# Patient Record
Sex: Female | Born: 1970 | Race: Black or African American | Hispanic: No | Smoking: Current every day smoker
Health system: Southern US, Community
[De-identification: ages and names within clinical notes are randomized; demographics above are authoritative.]

## PROBLEM LIST (undated history)

## (undated) DIAGNOSIS — I10 Essential (primary) hypertension: Secondary | ICD-10-CM

## (undated) DIAGNOSIS — F319 Bipolar disorder, unspecified: Secondary | ICD-10-CM

## (undated) DIAGNOSIS — E7800 Pure hypercholesterolemia, unspecified: Secondary | ICD-10-CM

## (undated) DIAGNOSIS — N200 Calculus of kidney: Secondary | ICD-10-CM

## (undated) DIAGNOSIS — F209 Schizophrenia, unspecified: Secondary | ICD-10-CM

## (undated) HISTORY — PX: TUBAL LIGATION: SHX77

## (undated) HISTORY — PX: CHOLECYSTECTOMY: SHX55

---

## 2000-06-20 ENCOUNTER — Ambulatory Visit (HOSPITAL_COMMUNITY): Admission: RE | Admit: 2000-06-20 | Discharge: 2000-06-20 | Payer: Self-pay | Admitting: Urology

## 2000-06-20 ENCOUNTER — Encounter: Payer: Self-pay | Admitting: Urology

## 2000-07-02 ENCOUNTER — Ambulatory Visit (HOSPITAL_COMMUNITY): Admission: RE | Admit: 2000-07-02 | Discharge: 2000-07-02 | Payer: Self-pay

## 2000-07-02 ENCOUNTER — Encounter: Payer: Self-pay | Admitting: Urology

## 2000-08-08 ENCOUNTER — Ambulatory Visit (HOSPITAL_COMMUNITY): Admission: RE | Admit: 2000-08-08 | Discharge: 2000-08-08 | Payer: Self-pay | Admitting: Urology

## 2000-08-08 ENCOUNTER — Encounter: Payer: Self-pay | Admitting: Urology

## 2000-08-23 ENCOUNTER — Ambulatory Visit (HOSPITAL_COMMUNITY): Admission: RE | Admit: 2000-08-23 | Discharge: 2000-08-23 | Payer: Self-pay

## 2000-08-23 ENCOUNTER — Encounter: Payer: Self-pay | Admitting: Urology

## 2000-11-21 ENCOUNTER — Emergency Department (HOSPITAL_COMMUNITY): Admission: EM | Admit: 2000-11-21 | Discharge: 2000-11-21 | Payer: Self-pay | Admitting: *Deleted

## 2000-12-12 ENCOUNTER — Emergency Department (HOSPITAL_COMMUNITY): Admission: EM | Admit: 2000-12-12 | Discharge: 2000-12-12 | Payer: Self-pay | Admitting: Emergency Medicine

## 2001-05-17 ENCOUNTER — Emergency Department (HOSPITAL_COMMUNITY): Admission: EM | Admit: 2001-05-17 | Discharge: 2001-05-17 | Payer: Self-pay

## 2001-06-24 ENCOUNTER — Emergency Department (HOSPITAL_COMMUNITY): Admission: EM | Admit: 2001-06-24 | Discharge: 2001-06-24 | Payer: Self-pay | Admitting: Emergency Medicine

## 2002-11-05 ENCOUNTER — Emergency Department (HOSPITAL_COMMUNITY): Admission: EM | Admit: 2002-11-05 | Discharge: 2002-11-05 | Payer: Self-pay | Admitting: Emergency Medicine

## 2003-05-21 ENCOUNTER — Emergency Department (HOSPITAL_COMMUNITY): Admission: EM | Admit: 2003-05-21 | Discharge: 2003-05-21 | Payer: Self-pay | Admitting: Internal Medicine

## 2003-06-10 ENCOUNTER — Ambulatory Visit (HOSPITAL_COMMUNITY): Admission: RE | Admit: 2003-06-10 | Discharge: 2003-06-10 | Payer: Self-pay | Admitting: Internal Medicine

## 2003-06-15 ENCOUNTER — Ambulatory Visit (HOSPITAL_COMMUNITY): Admission: RE | Admit: 2003-06-15 | Discharge: 2003-06-15 | Payer: Self-pay | Admitting: Internal Medicine

## 2003-06-26 ENCOUNTER — Ambulatory Visit (HOSPITAL_COMMUNITY): Admission: RE | Admit: 2003-06-26 | Discharge: 2003-06-26 | Payer: Self-pay | Admitting: Family Medicine

## 2003-07-13 ENCOUNTER — Ambulatory Visit (HOSPITAL_COMMUNITY): Admission: RE | Admit: 2003-07-13 | Discharge: 2003-07-13 | Payer: Self-pay | Admitting: Internal Medicine

## 2003-08-28 ENCOUNTER — Ambulatory Visit (HOSPITAL_COMMUNITY): Admission: RE | Admit: 2003-08-28 | Discharge: 2003-08-28 | Payer: Self-pay | Admitting: General Surgery

## 2004-02-09 ENCOUNTER — Ambulatory Visit: Payer: Self-pay | Admitting: Psychiatry

## 2004-02-09 ENCOUNTER — Emergency Department (HOSPITAL_COMMUNITY): Admission: EM | Admit: 2004-02-09 | Discharge: 2004-02-09 | Payer: Self-pay | Admitting: Emergency Medicine

## 2004-02-09 ENCOUNTER — Inpatient Hospital Stay (HOSPITAL_COMMUNITY): Admission: AD | Admit: 2004-02-09 | Discharge: 2004-02-18 | Payer: Self-pay | Admitting: Psychiatry

## 2004-02-27 ENCOUNTER — Emergency Department (HOSPITAL_COMMUNITY): Admission: EM | Admit: 2004-02-27 | Discharge: 2004-02-27 | Payer: Self-pay | Admitting: Emergency Medicine

## 2004-03-08 ENCOUNTER — Inpatient Hospital Stay (HOSPITAL_COMMUNITY): Admission: RE | Admit: 2004-03-08 | Discharge: 2004-03-23 | Payer: Self-pay | Admitting: Psychiatry

## 2004-04-13 ENCOUNTER — Ambulatory Visit (HOSPITAL_COMMUNITY): Admission: RE | Admit: 2004-04-13 | Discharge: 2004-04-13 | Payer: Self-pay | Admitting: Family Medicine

## 2004-10-03 ENCOUNTER — Ambulatory Visit (HOSPITAL_COMMUNITY): Admission: RE | Admit: 2004-10-03 | Discharge: 2004-10-03 | Payer: Self-pay | Admitting: Urology

## 2004-11-04 ENCOUNTER — Ambulatory Visit (HOSPITAL_COMMUNITY): Admission: RE | Admit: 2004-11-04 | Discharge: 2004-11-04 | Payer: Self-pay | Admitting: Urology

## 2005-04-21 ENCOUNTER — Ambulatory Visit (HOSPITAL_COMMUNITY): Admission: RE | Admit: 2005-04-21 | Discharge: 2005-04-21 | Payer: Self-pay | Admitting: Family Medicine

## 2005-10-12 ENCOUNTER — Emergency Department (HOSPITAL_COMMUNITY): Admission: EM | Admit: 2005-10-12 | Discharge: 2005-10-12 | Payer: Self-pay | Admitting: Emergency Medicine

## 2006-04-08 ENCOUNTER — Ambulatory Visit: Payer: Self-pay | Admitting: *Deleted

## 2006-04-09 ENCOUNTER — Inpatient Hospital Stay (HOSPITAL_COMMUNITY): Admission: EM | Admit: 2006-04-09 | Discharge: 2006-04-10 | Payer: Self-pay | Admitting: *Deleted

## 2006-06-26 ENCOUNTER — Emergency Department (HOSPITAL_COMMUNITY): Admission: EM | Admit: 2006-06-26 | Discharge: 2006-06-26 | Payer: Self-pay | Admitting: Emergency Medicine

## 2006-08-02 ENCOUNTER — Emergency Department (HOSPITAL_COMMUNITY): Admission: EM | Admit: 2006-08-02 | Discharge: 2006-08-02 | Payer: Self-pay | Admitting: Emergency Medicine

## 2007-09-17 ENCOUNTER — Emergency Department (HOSPITAL_COMMUNITY): Admission: EM | Admit: 2007-09-17 | Discharge: 2007-09-17 | Payer: Self-pay | Admitting: Emergency Medicine

## 2007-12-24 ENCOUNTER — Other Ambulatory Visit: Admission: RE | Admit: 2007-12-24 | Discharge: 2007-12-24 | Payer: Self-pay | Admitting: Obstetrics & Gynecology

## 2008-01-06 ENCOUNTER — Emergency Department (HOSPITAL_COMMUNITY): Admission: EM | Admit: 2008-01-06 | Discharge: 2008-01-06 | Payer: Self-pay | Admitting: Emergency Medicine

## 2008-06-17 ENCOUNTER — Emergency Department (HOSPITAL_COMMUNITY): Admission: EM | Admit: 2008-06-17 | Discharge: 2008-06-17 | Payer: Self-pay | Admitting: Emergency Medicine

## 2008-08-03 ENCOUNTER — Emergency Department (HOSPITAL_COMMUNITY): Admission: EM | Admit: 2008-08-03 | Discharge: 2008-08-04 | Payer: Self-pay | Admitting: Emergency Medicine

## 2009-02-21 ENCOUNTER — Emergency Department (HOSPITAL_COMMUNITY): Admission: EM | Admit: 2009-02-21 | Discharge: 2009-02-21 | Payer: Self-pay | Admitting: Emergency Medicine

## 2009-04-25 ENCOUNTER — Emergency Department (HOSPITAL_COMMUNITY): Admission: EM | Admit: 2009-04-25 | Discharge: 2009-04-25 | Payer: Self-pay | Admitting: Emergency Medicine

## 2009-09-02 ENCOUNTER — Emergency Department (HOSPITAL_COMMUNITY): Admission: EM | Admit: 2009-09-02 | Discharge: 2009-09-02 | Payer: Self-pay | Admitting: Emergency Medicine

## 2009-10-03 ENCOUNTER — Emergency Department (HOSPITAL_COMMUNITY): Admission: EM | Admit: 2009-10-03 | Discharge: 2009-10-03 | Payer: Self-pay | Admitting: Emergency Medicine

## 2009-10-04 ENCOUNTER — Emergency Department (HOSPITAL_COMMUNITY): Admission: EM | Admit: 2009-10-04 | Discharge: 2009-10-11 | Payer: Self-pay | Admitting: Emergency Medicine

## 2009-12-12 ENCOUNTER — Emergency Department (HOSPITAL_COMMUNITY): Admission: EM | Admit: 2009-12-12 | Discharge: 2009-12-12 | Payer: Self-pay | Admitting: Emergency Medicine

## 2009-12-13 ENCOUNTER — Emergency Department (HOSPITAL_COMMUNITY): Admission: EM | Admit: 2009-12-13 | Discharge: 2009-12-13 | Payer: Self-pay | Admitting: Emergency Medicine

## 2009-12-14 ENCOUNTER — Emergency Department (HOSPITAL_COMMUNITY): Admission: EM | Admit: 2009-12-14 | Discharge: 2009-12-20 | Payer: Self-pay | Admitting: Emergency Medicine

## 2010-02-16 ENCOUNTER — Emergency Department (HOSPITAL_COMMUNITY)
Admission: EM | Admit: 2010-02-16 | Discharge: 2010-02-18 | Disposition: A | Discharge status: Other Acute Inpatient Hospital | Payer: Self-pay | Source: Home / Self Care | Admitting: Emergency Medicine

## 2010-04-07 ENCOUNTER — Emergency Department (HOSPITAL_COMMUNITY)
Admission: EM | Admit: 2010-04-07 | Discharge: 2010-04-08 | Disposition: A | Discharge status: Home / Self Care | Payer: Medicaid Other | Attending: Emergency Medicine | Admitting: Emergency Medicine

## 2010-04-07 DIAGNOSIS — M545 Low back pain, unspecified: Secondary | ICD-10-CM | POA: Insufficient documentation

## 2010-04-07 DIAGNOSIS — R350 Frequency of micturition: Secondary | ICD-10-CM | POA: Insufficient documentation

## 2010-04-07 DIAGNOSIS — B9789 Other viral agents as the cause of diseases classified elsewhere: Secondary | ICD-10-CM | POA: Insufficient documentation

## 2010-04-07 DIAGNOSIS — R509 Fever, unspecified: Secondary | ICD-10-CM | POA: Insufficient documentation

## 2010-04-07 DIAGNOSIS — I10 Essential (primary) hypertension: Secondary | ICD-10-CM | POA: Insufficient documentation

## 2010-04-07 DIAGNOSIS — E119 Type 2 diabetes mellitus without complications: Secondary | ICD-10-CM | POA: Insufficient documentation

## 2010-04-07 DIAGNOSIS — F17200 Nicotine dependence, unspecified, uncomplicated: Secondary | ICD-10-CM | POA: Insufficient documentation

## 2010-04-07 DIAGNOSIS — F319 Bipolar disorder, unspecified: Secondary | ICD-10-CM | POA: Insufficient documentation

## 2010-04-07 DIAGNOSIS — R10819 Abdominal tenderness, unspecified site: Secondary | ICD-10-CM | POA: Insufficient documentation

## 2010-04-07 DIAGNOSIS — R110 Nausea: Secondary | ICD-10-CM | POA: Insufficient documentation

## 2010-04-07 LAB — URINALYSIS, ROUTINE W REFLEX MICROSCOPIC
Specific Gravity, Urine: <1.005 — ABNORMAL LOW (ref 1.005–1.030)
Urine Glucose, Fasting: >1000 mg/dL — AB
Urobilinogen, UA: 0.2 mg/dL (ref 0.0–1.0)
pH: 6.5 (ref 5.0–8.0)

## 2010-04-07 LAB — URINE MICROSCOPIC-ADD ON

## 2010-04-08 ENCOUNTER — Emergency Department (HOSPITAL_COMMUNITY): Admit: 2010-04-08 | Discharge: 2010-04-08 | Disposition: A | Discharge status: Home / Self Care | Payer: Medicaid Other

## 2010-04-08 LAB — GLUCOSE, CAPILLARY
Glucose-Capillary: 288 mg/dL — ABNORMAL HIGH (ref 70–99)
Glucose-Capillary: 314 mg/dL — ABNORMAL HIGH (ref 70–99)

## 2010-04-08 LAB — BASIC METABOLIC PANEL
BUN: 4 mg/dL — ABNORMAL LOW (ref 6–23)
Calcium: 9.6 mg/dL (ref 8.4–10.5)
GFR calc non Af Amer: >60 mL/min (ref >60)
Glucose, Bld: 323 mg/dL — ABNORMAL HIGH (ref 70–99)

## 2010-04-08 LAB — CBC
HCT: 31.6 % — ABNORMAL LOW (ref 36.0–46.0)
MCHC: 35.1 g/dL (ref 30.0–36.0)
MCV: 86.1 fL (ref 78.0–100.0)
RDW: 13.8 % (ref 11.5–15.5)

## 2010-04-08 LAB — DIFFERENTIAL
Basophils Absolute: 0.1 10*3/uL (ref 0.0–0.1)
Eosinophils Absolute: 0.2 10*3/uL (ref 0.0–0.7)
Eosinophils Relative: 2 % (ref 0–5)
Lymphocytes Relative: 12 % (ref 12–46)
Lymphs Abs: 2.1 10*3/uL (ref 0.7–4.0)
Monocytes Absolute: 1.8 10*3/uL — ABNORMAL HIGH (ref 0.1–1.0)

## 2010-04-10 ENCOUNTER — Emergency Department (HOSPITAL_COMMUNITY): Payer: Medicaid Other

## 2010-04-10 ENCOUNTER — Inpatient Hospital Stay (HOSPITAL_COMMUNITY)
Admission: EM | Admit: 2010-04-10 | Discharge: 2010-04-11 | DRG: 690 | Discharge status: Against Medical Advice | Payer: Medicaid Other | Attending: Family Medicine | Admitting: Family Medicine

## 2010-04-10 DIAGNOSIS — F319 Bipolar disorder, unspecified: Secondary | ICD-10-CM | POA: Diagnosis present

## 2010-04-10 DIAGNOSIS — Z9119 Patient's noncompliance with other medical treatment and regimen: Secondary | ICD-10-CM

## 2010-04-10 DIAGNOSIS — Z91199 Patient's noncompliance with other medical treatment and regimen due to unspecified reason: Secondary | ICD-10-CM

## 2010-04-10 DIAGNOSIS — E785 Hyperlipidemia, unspecified: Secondary | ICD-10-CM | POA: Diagnosis present

## 2010-04-10 DIAGNOSIS — E119 Type 2 diabetes mellitus without complications: Secondary | ICD-10-CM | POA: Diagnosis present

## 2010-04-10 DIAGNOSIS — N390 Urinary tract infection, site not specified: Principal | ICD-10-CM | POA: Diagnosis present

## 2010-04-10 DIAGNOSIS — F209 Schizophrenia, unspecified: Secondary | ICD-10-CM | POA: Diagnosis present

## 2010-04-10 LAB — GLUCOSE, CAPILLARY: Glucose-Capillary: 330 mg/dL — ABNORMAL HIGH (ref 70–99)

## 2010-04-10 LAB — CBC
HCT: 30.9 % — ABNORMAL LOW (ref 36.0–46.0)
MCHC: 34.6 g/dL (ref 30.0–36.0)
RDW: 13.4 % (ref 11.5–15.5)
WBC: 15.1 10*3/uL — ABNORMAL HIGH (ref 4.0–10.5)

## 2010-04-10 LAB — URINE MICROSCOPIC-ADD ON

## 2010-04-10 LAB — URINALYSIS, ROUTINE W REFLEX MICROSCOPIC
Bilirubin Urine: NEGATIVE
Ketones, ur: NEGATIVE mg/dL
Leukocytes, UA: NEGATIVE
Nitrite: NEGATIVE
Protein, ur: 30 mg/dL — AB

## 2010-04-10 LAB — DIFFERENTIAL
Basophils Absolute: 0.1 10*3/uL (ref 0.0–0.1)
Basophils Relative: 0 % (ref 0–1)
Eosinophils Relative: 3 % (ref 0–5)
Lymphocytes Relative: 17 % (ref 12–46)
Monocytes Absolute: 1.5 10*3/uL — ABNORMAL HIGH (ref 0.1–1.0)
Neutro Abs: 10.6 10*3/uL — ABNORMAL HIGH (ref 1.7–7.7)

## 2010-04-10 LAB — COMPREHENSIVE METABOLIC PANEL
ALT: 20 U/L (ref 0–35)
Alkaline Phosphatase: 80 U/L (ref 39–117)
BUN: 2 mg/dL — ABNORMAL LOW (ref 6–23)
CO2: 23 mEq/L (ref 19–32)
Chloride: 98 mEq/L (ref 96–112)
Glucose, Bld: 340 mg/dL — ABNORMAL HIGH (ref 70–99)
Potassium: 3.3 mEq/L — ABNORMAL LOW (ref 3.5–5.1)
Total Bilirubin: 0.3 mg/dL (ref 0.3–1.2)

## 2010-04-10 LAB — LIPASE, BLOOD: Lipase: 21 U/L (ref 11–59)

## 2010-04-11 LAB — GLUCOSE, CAPILLARY
Glucose-Capillary: 352 mg/dL — ABNORMAL HIGH (ref 70–99)
Glucose-Capillary: 382 mg/dL — ABNORMAL HIGH (ref 70–99)

## 2010-04-13 LAB — URINE CULTURE: Culture  Setup Time: 201202051253

## 2010-04-16 LAB — CULTURE, BLOOD (ROUTINE X 2): Culture: NO GROWTH

## 2010-04-21 NOTE — H&P (Signed)
NAME:  Emily Cordova, Emily Cordova NO.:  [id number]  MEDICAL RECORD NO.:  [id number]           PATIENT TYPE:  LOCATION:                                 FACILITY:  PHYSICIAN:  Melvyn Novas, MDDATE OF BIRTH:  1970-05-03  DATE OF ADMISSION: DATE OF DISCHARGE:  LH                             HISTORY & PHYSICAL   HISTORY OF PRESENT ILLNESS:  The patient is a 40 year old black female with a known history of bipolar disorder and schizophrenia who was followed by me for type 2 diabetes, hyperlipidemia, and some mild hypertension who apparently came in last night complaining of back pain, some dysuria, frequency, and so forth.  She also has multiple complaints of right ear fullness, cephalgia, some mild cough, anxiety, and paresthesia of the right arm.  She was found to have a UTI of significance.  She was admitted, placed on IV Rocephin, and there is a question of compliance here.  She denies gross hematuria, nausea, vomiting, hematemesis, and melena.  PAST MEDICAL HISTORY:  Significant for schizophrenia, bipolar disorder, hypertension, diabetes, and cephalgia.  PAST SURGICAL HISTORY:  Remarkable for cholecystectomy.  ALLERGIES:  She has allergy listed as HALDOL in the old medical record.  MEDICATIONS:  Current medicines by me in the office are Zocor 20 mg per day, Cogentin 2 mg p.o. b.i.d., metformin 1000 mg p.o. b.i.d., aspirin 81 mg per day, Norvasc 5 mg per day, lisinopril 20/12.5 p.o. daily, lithium 300 mg 2 tablets in a.m. and 3 tablets at nightly, Zyprexa 10 mg nightly, as well as Invega injections once a month unknown dosage.  SOCIAL HISTORY:  She lives with daughter, get psychotropic medicines from [facility] regularly once a week.  She smokes 5 cigarettes per day,does not imbibe alcohol.  VITAL SIGNS:  Blood pressure is 117/73, temperature 97.22, pulse 92 and regular, respiratory rate is 28, O2 status is 88%. EYES:  PERRLA intact.  Sclerae clear.   Conjunctivae pink. NECK:  JVD.  No carotid bruits.  No thyromegaly or thyroid bruits. LUNGS:  Prolonged expiratory phase.  Scattered rhonchi.  No rales.  No wheeze. HEART:  Regular rhythm.  No murmurs, gallops, heaves, or rubs. ABDOMEN:  Soft and nontender.  Bowel sounds are normoactive.  No guarding.  No rebound, mass or megaly.  No CVA tenderness. EXTREMITIES:  Trace to 1+ pedal edema. NEUROLOGIC: The patient appears highly anxious some tangential thought and mild flight of ideas.  Cranial nerves II through XII are grossly intact.  Moves all 4 extremities.  Plantars are downgoing.  IMPRESSION:  As follows. 1. Urinary tract infection. 2. Chronic noncompliance. 3. Hypertension. 4. Diabetes. 5. Schizophrenia. 6. Bipolar disorder.  PLAN:  At the present is to continue most of her psychotropic medicines. We will hold antihypertensives as she is 89 right now and has been noncompliant with routine lithium level, hemoglobin A1c to assess compliance of thyroid status.  We will follow her clinically and hopefully we will have her out of the hospital 2-3 days.     Melvyn Novas, MD     RMD/MEDQ  D:  04/11/2010  T:  04/12/2010  Job:  [id number]  Electronically Signed by Oval Linsey MD on 04/21/2010 03:36:43 PM

## 2010-05-11 ENCOUNTER — Emergency Department (HOSPITAL_COMMUNITY)
Admission: EM | Admit: 2010-05-11 | Discharge: 2010-05-11 | Disposition: A | Discharge status: Home / Self Care | Payer: Medicaid Other | Attending: Emergency Medicine | Admitting: Emergency Medicine

## 2010-05-11 DIAGNOSIS — E876 Hypokalemia: Secondary | ICD-10-CM | POA: Insufficient documentation

## 2010-05-11 DIAGNOSIS — F319 Bipolar disorder, unspecified: Secondary | ICD-10-CM | POA: Insufficient documentation

## 2010-05-11 DIAGNOSIS — I10 Essential (primary) hypertension: Secondary | ICD-10-CM | POA: Insufficient documentation

## 2010-05-11 DIAGNOSIS — G478 Other sleep disorders: Secondary | ICD-10-CM | POA: Insufficient documentation

## 2010-05-11 DIAGNOSIS — E119 Type 2 diabetes mellitus without complications: Secondary | ICD-10-CM | POA: Insufficient documentation

## 2010-05-11 LAB — COMPREHENSIVE METABOLIC PANEL
ALT: 17 U/L (ref 0–35)
AST: 19 U/L (ref 0–37)
Albumin: 3.8 g/dL (ref 3.5–5.2)
Calcium: 9.5 mg/dL (ref 8.4–10.5)
Chloride: 104 mEq/L (ref 96–112)
Creatinine, Ser: 0.74 mg/dL (ref 0.4–1.2)
GFR calc Af Amer: >60 mL/min (ref >60)
Sodium: 135 mEq/L (ref 135–145)
Total Bilirubin: 0.5 mg/dL (ref 0.3–1.2)

## 2010-05-11 LAB — URINALYSIS, ROUTINE W REFLEX MICROSCOPIC
Bilirubin Urine: NEGATIVE
Glucose, UA: NEGATIVE mg/dL
Hgb urine dipstick: NEGATIVE
Specific Gravity, Urine: <1.005 — ABNORMAL LOW (ref 1.005–1.030)

## 2010-05-11 LAB — DIFFERENTIAL
Basophils Absolute: 0.1 10*3/uL (ref 0.0–0.1)
Basophils Relative: 0 % (ref 0–1)
Eosinophils Absolute: 0.5 10*3/uL (ref 0.0–0.7)
Monocytes Relative: 7 % (ref 3–12)
Neutro Abs: 6.9 10*3/uL (ref 1.7–7.7)
Neutrophils Relative %: 56 % (ref 43–77)

## 2010-05-11 LAB — RAPID URINE DRUG SCREEN, HOSP PERFORMED
Amphetamines: NOT DETECTED
Barbiturates: NOT DETECTED
Benzodiazepines: NOT DETECTED
Opiates: NOT DETECTED

## 2010-05-11 LAB — CBC
Hemoglobin: 12.7 g/dL (ref 12.0–15.0)
MCH: 29.7 pg (ref 26.0–34.0)
Platelets: 353 10*3/uL (ref 150–400)
RBC: 4.27 MIL/uL (ref 3.87–5.11)

## 2010-05-11 LAB — POCT CARDIAC MARKERS
CKMB, poc: <1 ng/mL — ABNORMAL LOW (ref 1.0–8.0)
Myoglobin, poc: 59.1 ng/mL (ref 12–200)
Troponin i, poc: <0.05 ng/mL (ref 0.00–0.09)

## 2010-05-12 ENCOUNTER — Emergency Department (HOSPITAL_COMMUNITY)
Admission: EM | Admit: 2010-05-12 | Discharge: 2010-05-16 | Disposition: A | Discharge status: Other Acute Inpatient Hospital | Payer: Medicaid Other | Attending: Emergency Medicine | Admitting: Emergency Medicine

## 2010-05-12 DIAGNOSIS — E119 Type 2 diabetes mellitus without complications: Secondary | ICD-10-CM | POA: Insufficient documentation

## 2010-05-12 DIAGNOSIS — F29 Unspecified psychosis not due to a substance or known physiological condition: Secondary | ICD-10-CM | POA: Insufficient documentation

## 2010-05-12 DIAGNOSIS — J189 Pneumonia, unspecified organism: Secondary | ICD-10-CM | POA: Insufficient documentation

## 2010-05-12 LAB — COMPREHENSIVE METABOLIC PANEL
ALT: 21 U/L (ref 0–35)
AST: 23 U/L (ref 0–37)
Alkaline Phosphatase: 100 U/L (ref 39–117)
CO2: 22 mEq/L (ref 19–32)
Chloride: 102 mEq/L (ref 96–112)
GFR calc Af Amer: >60 mL/min (ref >60)
GFR calc non Af Amer: >60 mL/min (ref >60)
Potassium: 3.5 mEq/L (ref 3.5–5.1)
Sodium: 134 mEq/L — ABNORMAL LOW (ref 135–145)
Total Bilirubin: 0.4 mg/dL (ref 0.3–1.2)

## 2010-05-12 LAB — RAPID URINE DRUG SCREEN, HOSP PERFORMED
Amphetamines: NOT DETECTED
Benzodiazepines: NOT DETECTED

## 2010-05-12 LAB — CBC
HCT: 39.4 % (ref 36.0–46.0)
Hemoglobin: 13 g/dL (ref 12.0–15.0)
RBC: 4.5 MIL/uL (ref 3.87–5.11)

## 2010-05-12 LAB — DIFFERENTIAL
Basophils Absolute: 0 10*3/uL (ref 0.0–0.1)
Basophils Relative: 0 % (ref 0–1)
Lymphocytes Relative: 24 % (ref 12–46)
Neutro Abs: 7.1 10*3/uL (ref 1.7–7.7)
Neutrophils Relative %: 68 % (ref 43–77)

## 2010-05-13 LAB — LITHIUM LEVEL: Lithium Lvl: 0.46 mEq/L — ABNORMAL LOW (ref 0.80–1.40)

## 2010-05-14 LAB — GLUCOSE, CAPILLARY
Glucose-Capillary: 240 mg/dL — ABNORMAL HIGH (ref 70–99)
Glucose-Capillary: 315 mg/dL — ABNORMAL HIGH (ref 70–99)

## 2010-05-15 ENCOUNTER — Emergency Department (HOSPITAL_COMMUNITY): Payer: Medicaid Other

## 2010-05-15 LAB — GLUCOSE, CAPILLARY
Glucose-Capillary: 285 mg/dL — ABNORMAL HIGH (ref 70–99)
Glucose-Capillary: 278 mg/dL — ABNORMAL HIGH (ref 70–99)
Glucose-Capillary: 252 mg/dL — ABNORMAL HIGH (ref 70–99)

## 2010-05-16 LAB — URINALYSIS, ROUTINE W REFLEX MICROSCOPIC
Leukocytes, UA: NEGATIVE
Nitrite: NEGATIVE
Specific Gravity, Urine: 1.02 (ref 1.005–1.030)
pH: 6 (ref 5.0–8.0)

## 2010-05-16 LAB — BASIC METABOLIC PANEL
Chloride: 105 mEq/L (ref 96–112)
GFR calc Af Amer: >60 mL/min (ref >60)
Potassium: 3.8 mEq/L (ref 3.5–5.1)

## 2010-05-16 LAB — LITHIUM LEVEL: Lithium Lvl: <0.25 mEq/L — ABNORMAL LOW (ref 0.80–1.40)

## 2010-05-16 LAB — CBC
HCT: 35.7 % — ABNORMAL LOW (ref 36.0–46.0)
MCV: 87.7 fL (ref 78.0–100.0)
RBC: 4.07 MIL/uL (ref 3.87–5.11)
RDW: 13.2 % (ref 11.5–15.5)
WBC: 11.2 10*3/uL — ABNORMAL HIGH (ref 4.0–10.5)

## 2010-05-16 LAB — DIFFERENTIAL
Eosinophils Relative: 2 % (ref 0–5)
Lymphocytes Relative: 26 % (ref 12–46)
Lymphs Abs: 2.9 10*3/uL (ref 0.7–4.0)
Monocytes Absolute: 0.6 10*3/uL (ref 0.1–1.0)

## 2010-05-16 LAB — GLUCOSE, CAPILLARY: Glucose-Capillary: 248 mg/dL — ABNORMAL HIGH (ref 70–99)

## 2010-05-16 LAB — URINE MICROSCOPIC-ADD ON

## 2010-05-16 LAB — PREGNANCY, URINE: Preg Test, Ur: NEGATIVE

## 2010-05-16 LAB — RAPID URINE DRUG SCREEN, HOSP PERFORMED: Benzodiazepines: NOT DETECTED

## 2010-05-18 LAB — URINE MICROSCOPIC-ADD ON

## 2010-05-18 LAB — CBC
HCT: 34.3 % — ABNORMAL LOW (ref 36.0–46.0)
HCT: 35.4 % — ABNORMAL LOW (ref 36.0–46.0)
HCT: 37.7 % (ref 36.0–46.0)
Hemoglobin: 12.2 g/dL (ref 12.0–15.0)
Hemoglobin: 12.9 g/dL (ref 12.0–15.0)
MCH: 31.4 pg (ref 26.0–34.0)
MCH: 31.6 pg (ref 26.0–34.0)
MCH: 31.7 pg (ref 26.0–34.0)
MCHC: 34.4 g/dL (ref 30.0–36.0)
MCHC: 34.6 g/dL (ref 30.0–36.0)
MCV: 91.7 fL (ref 78.0–100.0)
MCV: 92 fL (ref 78.0–100.0)
MCV: 92.2 fL (ref 78.0–100.0)
Platelets: 469 10*3/uL — ABNORMAL HIGH (ref 150–400)
RBC: 4.09 MIL/uL (ref 3.87–5.11)
RDW: 13.1 % (ref 11.5–15.5)
RDW: 13.5 % (ref 11.5–15.5)

## 2010-05-18 LAB — DIFFERENTIAL
Basophils Relative: 0 % (ref 0–1)
Basophils Relative: 0 % (ref 0–1)
Eosinophils Absolute: 0.2 10*3/uL (ref 0.0–0.7)
Eosinophils Absolute: 0.4 10*3/uL (ref 0.0–0.7)
Eosinophils Relative: 1 % (ref 0–5)
Lymphs Abs: 2.1 10*3/uL (ref 0.7–4.0)
Lymphs Abs: 2.4 10*3/uL (ref 0.7–4.0)
Lymphs Abs: 2.6 10*3/uL (ref 0.7–4.0)
Monocytes Absolute: 0.7 10*3/uL (ref 0.1–1.0)
Monocytes Relative: 4 % (ref 3–12)
Monocytes Relative: 6 % (ref 3–12)
Monocytes Relative: 6 % (ref 3–12)
Neutro Abs: 10.4 10*3/uL — ABNORMAL HIGH (ref 1.7–7.7)
Neutro Abs: 10.5 10*3/uL — ABNORMAL HIGH (ref 1.7–7.7)
Neutrophils Relative %: 73 % (ref 43–77)
Neutrophils Relative %: 73 % (ref 43–77)

## 2010-05-18 LAB — LITHIUM LEVEL
Lithium Lvl: 0.35 mEq/L — ABNORMAL LOW (ref 0.80–1.40)
Lithium Lvl: 0.37 mEq/L — ABNORMAL LOW (ref 0.80–1.40)
Lithium Lvl: 0.48 mEq/L — ABNORMAL LOW (ref 0.80–1.40)

## 2010-05-18 LAB — COMPREHENSIVE METABOLIC PANEL
Alkaline Phosphatase: 94 U/L (ref 39–117)
BUN: 4 mg/dL — ABNORMAL LOW (ref 6–23)
BUN: 6 mg/dL (ref 6–23)
CO2: 22 mEq/L (ref 19–32)
Calcium: 9.5 mg/dL (ref 8.4–10.5)
Calcium: 9.6 mg/dL (ref 8.4–10.5)
GFR calc non Af Amer: >60 mL/min (ref >60)
Glucose, Bld: 280 mg/dL — ABNORMAL HIGH (ref 70–99)
Glucose, Bld: 387 mg/dL — ABNORMAL HIGH (ref 70–99)
Total Protein: 7.3 g/dL (ref 6.0–8.3)
Total Protein: 7.8 g/dL (ref 6.0–8.3)

## 2010-05-18 LAB — URINALYSIS, ROUTINE W REFLEX MICROSCOPIC
Bilirubin Urine: NEGATIVE
Glucose, UA: >1000 mg/dL — AB
Ketones, ur: NEGATIVE mg/dL
Nitrite: NEGATIVE
Specific Gravity, Urine: 1.01 (ref 1.005–1.030)
Urobilinogen, UA: 0.2 mg/dL (ref 0.0–1.0)
pH: 5.5 (ref 5.0–8.0)

## 2010-05-18 LAB — RAPID URINE DRUG SCREEN, HOSP PERFORMED
Barbiturates: NOT DETECTED
Barbiturates: NOT DETECTED
Benzodiazepines: NOT DETECTED
Cocaine: NOT DETECTED
Opiates: NOT DETECTED
Opiates: NOT DETECTED
Tetrahydrocannabinol: NOT DETECTED

## 2010-05-18 LAB — GLUCOSE, CAPILLARY
Glucose-Capillary: 148 mg/dL — ABNORMAL HIGH (ref 70–99)
Glucose-Capillary: 173 mg/dL — ABNORMAL HIGH (ref 70–99)
Glucose-Capillary: 179 mg/dL — ABNORMAL HIGH (ref 70–99)
Glucose-Capillary: 109 mg/dL — ABNORMAL HIGH (ref 70–99)
Glucose-Capillary: 161 mg/dL — ABNORMAL HIGH (ref 70–99)
Glucose-Capillary: 177 mg/dL — ABNORMAL HIGH (ref 70–99)
Glucose-Capillary: 204 mg/dL — ABNORMAL HIGH (ref 70–99)
Glucose-Capillary: 217 mg/dL — ABNORMAL HIGH (ref 70–99)
Glucose-Capillary: 218 mg/dL — ABNORMAL HIGH (ref 70–99)
Glucose-Capillary: 284 mg/dL — ABNORMAL HIGH (ref 70–99)

## 2010-05-18 LAB — BASIC METABOLIC PANEL
CO2: 21 mEq/L (ref 19–32)
Chloride: 105 mEq/L (ref 96–112)
Creatinine, Ser: 0.64 mg/dL (ref 0.4–1.2)
GFR calc Af Amer: >60 mL/min (ref >60)
Potassium: 3.9 mEq/L (ref 3.5–5.1)

## 2010-05-18 LAB — POCT PREGNANCY, URINE: Preg Test, Ur: NEGATIVE

## 2010-05-19 LAB — GLUCOSE, CAPILLARY: Glucose-Capillary: 241 mg/dL — ABNORMAL HIGH (ref 70–99)

## 2010-05-20 LAB — GLUCOSE, CAPILLARY
Glucose-Capillary: 109 mg/dL — ABNORMAL HIGH (ref 70–99)
Glucose-Capillary: 111 mg/dL — ABNORMAL HIGH (ref 70–99)
Glucose-Capillary: 123 mg/dL — ABNORMAL HIGH (ref 70–99)
Glucose-Capillary: 125 mg/dL — ABNORMAL HIGH (ref 70–99)
Glucose-Capillary: 129 mg/dL — ABNORMAL HIGH (ref 70–99)
Glucose-Capillary: 132 mg/dL — ABNORMAL HIGH (ref 70–99)
Glucose-Capillary: 139 mg/dL — ABNORMAL HIGH (ref 70–99)
Glucose-Capillary: 167 mg/dL — ABNORMAL HIGH (ref 70–99)
Glucose-Capillary: 168 mg/dL — ABNORMAL HIGH (ref 70–99)
Glucose-Capillary: 179 mg/dL — ABNORMAL HIGH (ref 70–99)
Glucose-Capillary: 191 mg/dL — ABNORMAL HIGH (ref 70–99)
Glucose-Capillary: 224 mg/dL — ABNORMAL HIGH (ref 70–99)
Glucose-Capillary: 230 mg/dL — ABNORMAL HIGH (ref 70–99)
Glucose-Capillary: 331 mg/dL — ABNORMAL HIGH (ref 70–99)
Glucose-Capillary: 355 mg/dL — ABNORMAL HIGH (ref 70–99)

## 2010-05-20 LAB — DIFFERENTIAL
Basophils Absolute: 0.1 10*3/uL (ref 0.0–0.1)
Basophils Relative: 0 % (ref 0–1)
Basophils Relative: 0 % (ref 0–1)
Eosinophils Absolute: 0.3 10*3/uL (ref 0.0–0.7)
Eosinophils Relative: 1 % (ref 0–5)
Eosinophils Relative: 2 % (ref 0–5)
Lymphocytes Relative: 13 % (ref 12–46)
Lymphs Abs: 2.5 10*3/uL (ref 0.7–4.0)
Monocytes Absolute: 0.9 10*3/uL (ref 0.1–1.0)
Monocytes Absolute: 1 10*3/uL (ref 0.1–1.0)
Monocytes Relative: 5 % (ref 3–12)
Monocytes Relative: 8 % (ref 3–12)
Neutro Abs: 15.4 10*3/uL — ABNORMAL HIGH (ref 1.7–7.7)

## 2010-05-20 LAB — CBC
HCT: 39.5 % (ref 36.0–46.0)
HCT: 41.3 % (ref 36.0–46.0)
Hemoglobin: 13.6 g/dL (ref 12.0–15.0)
Hemoglobin: 14.3 g/dL (ref 12.0–15.0)
MCH: 31 pg (ref 26.0–34.0)
MCHC: 34.4 g/dL (ref 30.0–36.0)
MCHC: 34.5 g/dL (ref 30.0–36.0)
MCV: 90.1 fL (ref 78.0–100.0)
MCV: 90.6 fL (ref 78.0–100.0)
RBC: 4.38 MIL/uL (ref 3.87–5.11)
RDW: 12.5 % (ref 11.5–15.5)

## 2010-05-20 LAB — BASIC METABOLIC PANEL
BUN: 28 mg/dL — ABNORMAL HIGH (ref 6–23)
BUN: 7 mg/dL (ref 6–23)
CO2: 24 mEq/L (ref 19–32)
Calcium: 10.3 mg/dL (ref 8.4–10.5)
Chloride: 100 mEq/L (ref 96–112)
Chloride: 103 mEq/L (ref 96–112)
Creatinine, Ser: 0.96 mg/dL (ref 0.4–1.2)
GFR calc Af Amer: >60 mL/min (ref >60)
GFR calc non Af Amer: >60 mL/min (ref >60)
GFR calc non Af Amer: >60 mL/min (ref >60)
Glucose, Bld: 230 mg/dL — ABNORMAL HIGH (ref 70–99)
Glucose, Bld: 353 mg/dL — ABNORMAL HIGH (ref 70–99)
Potassium: 3.7 mEq/L (ref 3.5–5.1)
Sodium: 129 mEq/L — ABNORMAL LOW (ref 135–145)
Sodium: 134 mEq/L — ABNORMAL LOW (ref 135–145)

## 2010-05-20 LAB — URINE MICROSCOPIC-ADD ON

## 2010-05-20 LAB — LITHIUM LEVEL
Lithium Lvl: 0.66 mEq/L — ABNORMAL LOW (ref 0.80–1.40)
Lithium Lvl: 1.97 mEq/L — ABNORMAL HIGH (ref 0.80–1.40)

## 2010-05-20 LAB — RAPID URINE DRUG SCREEN, HOSP PERFORMED
Barbiturates: NOT DETECTED
Cocaine: NOT DETECTED
Opiates: NOT DETECTED

## 2010-05-20 LAB — URINALYSIS, ROUTINE W REFLEX MICROSCOPIC
Glucose, UA: >1000 mg/dL — AB
Hgb urine dipstick: NEGATIVE
Leukocytes, UA: NEGATIVE
Specific Gravity, Urine: <1.005 — ABNORMAL LOW (ref 1.005–1.030)
pH: 7 (ref 5.0–8.0)

## 2010-05-21 LAB — COMPREHENSIVE METABOLIC PANEL
AST: 22 U/L (ref 0–37)
Albumin: 4.8 g/dL (ref 3.5–5.2)
Chloride: 100 mEq/L (ref 96–112)
Creatinine, Ser: 0.69 mg/dL (ref 0.4–1.2)
GFR calc Af Amer: >60 mL/min (ref >60)
Potassium: 3.7 mEq/L (ref 3.5–5.1)
Total Bilirubin: 0.8 mg/dL (ref 0.3–1.2)
Total Protein: 8.5 g/dL — ABNORMAL HIGH (ref 6.0–8.3)

## 2010-05-21 LAB — RAPID URINE DRUG SCREEN, HOSP PERFORMED
Amphetamines: NOT DETECTED
Tetrahydrocannabinol: NOT DETECTED

## 2010-05-21 LAB — URINALYSIS, ROUTINE W REFLEX MICROSCOPIC
Bilirubin Urine: NEGATIVE
Hgb urine dipstick: NEGATIVE
Ketones, ur: NEGATIVE mg/dL
Nitrite: NEGATIVE
Urobilinogen, UA: 0.2 mg/dL (ref 0.0–1.0)

## 2010-05-21 LAB — DIFFERENTIAL
Basophils Absolute: 0.1 10*3/uL (ref 0.0–0.1)
Eosinophils Relative: 1 % (ref 0–5)
Lymphocytes Relative: 20 % (ref 12–46)
Lymphs Abs: 3 10*3/uL (ref 0.7–4.0)
Monocytes Absolute: 1 10*3/uL (ref 0.1–1.0)
Monocytes Relative: 7 % (ref 3–12)
Neutro Abs: 10.6 10*3/uL — ABNORMAL HIGH (ref 1.7–7.7)

## 2010-05-21 LAB — LITHIUM LEVEL: Lithium Lvl: 0.76 mEq/L — ABNORMAL LOW (ref 0.80–1.40)

## 2010-05-21 LAB — POCT PREGNANCY, URINE: Preg Test, Ur: NEGATIVE

## 2010-05-21 LAB — CBC
MCH: 31.2 pg (ref 26.0–34.0)
Platelets: 386 10*3/uL (ref 150–400)
RBC: 4.61 MIL/uL (ref 3.87–5.11)
RDW: 12.6 % (ref 11.5–15.5)
WBC: 14.9 10*3/uL — ABNORMAL HIGH (ref 4.0–10.5)

## 2010-05-23 NOTE — Discharge Summary (Signed)
  [name], Emily Cordova NO.:  [id number]  MEDICAL RECORD NO.:  [id number]           PATIENT TYPE:  LOCATION:                                 FACILITY:  PHYSICIAN:  Melvyn Novas, MDDATE OF BIRTH:  June 06, 1970  DATE OF ADMISSION: DATE OF DISCHARGE:  LH                              DISCHARGE SUMMARY   This was done approximately 4 weeks after discharge or signing out AMA. Apparently, the patient was admitted.  There is no ambulatory medical record chart on the electronic documents.  No history and physical.  No evidence that she is actually admitted.  Therefore I cannot intelligently add anything to her discharge summary or signing out AMA, showed date of discharge was February 6, and the date on the HIPAA form was April 12, 2010, hopefully electronic medical record will allow me to see some data concerning this patient.     Melvyn Novas, MD     RMD/MEDQ  D:  05/12/2010  T:  05/13/2010  Job:  [id number]  Electronically Signed by Oval Linsey MD on 05/23/2010 03:49:00 PM

## 2010-05-25 LAB — URINE MICROSCOPIC-ADD ON

## 2010-05-25 LAB — URINALYSIS, ROUTINE W REFLEX MICROSCOPIC
Nitrite: NEGATIVE
Specific Gravity, Urine: 1.02 (ref 1.005–1.030)
Urobilinogen, UA: 0.2 mg/dL (ref 0.0–1.0)

## 2010-05-25 LAB — GLUCOSE, CAPILLARY: Glucose-Capillary: 254 mg/dL — ABNORMAL HIGH (ref 70–99)

## 2010-06-06 LAB — URINALYSIS, ROUTINE W REFLEX MICROSCOPIC
Bilirubin Urine: NEGATIVE
Nitrite: POSITIVE — AB
Protein, ur: NEGATIVE mg/dL
Specific Gravity, Urine: 1.025 (ref 1.005–1.030)
Urobilinogen, UA: 0.2 mg/dL (ref 0.0–1.0)

## 2010-06-06 LAB — URINE CULTURE

## 2010-06-06 LAB — URINE MICROSCOPIC-ADD ON

## 2010-06-14 LAB — CBC
HCT: 32.3 % — ABNORMAL LOW (ref 36.0–46.0)
Hemoglobin: 11.5 g/dL — ABNORMAL LOW (ref 12.0–15.0)
MCHC: 35.6 g/dL (ref 30.0–36.0)
MCV: 90.1 fL (ref 78.0–100.0)
Platelets: 443 10*3/uL — ABNORMAL HIGH (ref 150–400)
RBC: 3.59 MIL/uL — ABNORMAL LOW (ref 3.87–5.11)
RDW: 12.8 % (ref 11.5–15.5)
WBC: 13.2 10*3/uL — ABNORMAL HIGH (ref 4.0–10.5)

## 2010-06-14 LAB — URINALYSIS, ROUTINE W REFLEX MICROSCOPIC
Bilirubin Urine: NEGATIVE
Glucose, UA: 100 mg/dL — AB
Protein, ur: 100 mg/dL — AB
Urobilinogen, UA: 0.2 mg/dL (ref 0.0–1.0)

## 2010-06-14 LAB — URINE MICROSCOPIC-ADD ON

## 2010-06-14 LAB — BASIC METABOLIC PANEL
BUN: 6 mg/dL (ref 6–23)
CO2: 26 mEq/L (ref 19–32)
Calcium: 9.8 mg/dL (ref 8.4–10.5)
Chloride: 102 mEq/L (ref 96–112)
Creatinine, Ser: 0.63 mg/dL (ref 0.4–1.2)
GFR calc Af Amer: >60 mL/min (ref >60)
GFR calc non Af Amer: >60 mL/min (ref >60)
Glucose, Bld: 233 mg/dL — ABNORMAL HIGH (ref 70–99)
Potassium: 3.5 mEq/L (ref 3.5–5.1)
Sodium: 134 mEq/L — ABNORMAL LOW (ref 135–145)

## 2010-06-14 LAB — DIFFERENTIAL
Basophils Absolute: 0 10*3/uL (ref 0.0–0.1)
Basophils Relative: 0 % (ref 0–1)
Eosinophils Absolute: 0.7 10*3/uL (ref 0.0–0.7)
Eosinophils Relative: 5 % (ref 0–5)
Lymphocytes Relative: 22 % (ref 12–46)
Lymphs Abs: 2.9 10*3/uL (ref 0.7–4.0)
Monocytes Absolute: 0.6 10*3/uL (ref 0.1–1.0)
Monocytes Relative: 5 % (ref 3–12)
Neutro Abs: 9 10*3/uL — ABNORMAL HIGH (ref 1.7–7.7)
Neutrophils Relative %: 68 % (ref 43–77)

## 2010-06-15 LAB — RAPID STREP SCREEN (MED CTR MEBANE ONLY): Streptococcus, Group A Screen (Direct): NEGATIVE

## 2010-06-16 ENCOUNTER — Emergency Department (HOSPITAL_COMMUNITY)
Admission: EM | Admit: 2010-06-16 | Discharge: 2010-06-22 | Disposition: A | Discharge status: Other Acute Inpatient Hospital | Payer: Medicaid Other | Attending: Emergency Medicine | Admitting: Emergency Medicine

## 2010-06-16 DIAGNOSIS — I10 Essential (primary) hypertension: Secondary | ICD-10-CM | POA: Insufficient documentation

## 2010-06-16 DIAGNOSIS — F411 Generalized anxiety disorder: Secondary | ICD-10-CM | POA: Insufficient documentation

## 2010-06-16 DIAGNOSIS — F17200 Nicotine dependence, unspecified, uncomplicated: Secondary | ICD-10-CM | POA: Insufficient documentation

## 2010-06-16 DIAGNOSIS — E119 Type 2 diabetes mellitus without complications: Secondary | ICD-10-CM | POA: Insufficient documentation

## 2010-06-16 DIAGNOSIS — Z79899 Other long term (current) drug therapy: Secondary | ICD-10-CM | POA: Insufficient documentation

## 2010-06-16 DIAGNOSIS — F209 Schizophrenia, unspecified: Secondary | ICD-10-CM | POA: Insufficient documentation

## 2010-06-16 DIAGNOSIS — F3130 Bipolar disorder, current episode depressed, mild or moderate severity, unspecified: Secondary | ICD-10-CM | POA: Insufficient documentation

## 2010-06-17 LAB — DIFFERENTIAL
Basophils Absolute: 0 10*3/uL (ref 0.0–0.1)
Eosinophils Absolute: 0.4 10*3/uL (ref 0.0–0.7)
Eosinophils Relative: 4 % (ref 0–5)
Lymphocytes Relative: 27 % (ref 12–46)
Lymphs Abs: 2.9 10*3/uL (ref 0.7–4.0)
Neutrophils Relative %: 65 % (ref 43–77)

## 2010-06-17 LAB — BASIC METABOLIC PANEL
Calcium: 9.4 mg/dL (ref 8.4–10.5)
GFR calc Af Amer: >60 mL/min (ref >60)
GFR calc non Af Amer: >60 mL/min (ref >60)
Glucose, Bld: 322 mg/dL — ABNORMAL HIGH (ref 70–99)
Potassium: 3.4 mEq/L — ABNORMAL LOW (ref 3.5–5.1)
Sodium: 135 mEq/L (ref 135–145)

## 2010-06-17 LAB — URINE MICROSCOPIC-ADD ON

## 2010-06-17 LAB — URINALYSIS, ROUTINE W REFLEX MICROSCOPIC
Hgb urine dipstick: NEGATIVE
Leukocytes, UA: NEGATIVE
Nitrite: NEGATIVE
Protein, ur: 100 mg/dL — AB
Specific Gravity, Urine: 1.03 (ref 1.005–1.030)
Urobilinogen, UA: 0.2 mg/dL (ref 0.0–1.0)

## 2010-06-17 LAB — CBC
HCT: 37.7 % (ref 36.0–46.0)
MCV: 88.3 fL (ref 78.0–100.0)
Platelets: 331 10*3/uL (ref 150–400)
RBC: 4.27 MIL/uL (ref 3.87–5.11)
RDW: 14.4 % (ref 11.5–15.5)
WBC: 10.7 10*3/uL — ABNORMAL HIGH (ref 4.0–10.5)

## 2010-06-17 LAB — GLUCOSE, CAPILLARY
Glucose-Capillary: 396 mg/dL — ABNORMAL HIGH (ref 70–99)
Glucose-Capillary: 247 mg/dL — ABNORMAL HIGH (ref 70–99)
Glucose-Capillary: 230 mg/dL — ABNORMAL HIGH (ref 70–99)

## 2010-06-17 LAB — RAPID URINE DRUG SCREEN, HOSP PERFORMED
Cocaine: NOT DETECTED
Tetrahydrocannabinol: NOT DETECTED

## 2010-06-18 LAB — GLUCOSE, CAPILLARY: Glucose-Capillary: 246 mg/dL — ABNORMAL HIGH (ref 70–99)

## 2010-06-18 LAB — LITHIUM LEVEL: Lithium Lvl: 0.56 mEq/L — ABNORMAL LOW (ref 0.80–1.40)

## 2010-06-18 LAB — POCT PREGNANCY, URINE: Preg Test, Ur: NEGATIVE

## 2010-06-19 LAB — GLUCOSE, CAPILLARY: Glucose-Capillary: 95 mg/dL (ref 70–99)

## 2010-06-20 LAB — GLUCOSE, CAPILLARY
Glucose-Capillary: 145 mg/dL — ABNORMAL HIGH (ref 70–99)
Glucose-Capillary: 186 mg/dL — ABNORMAL HIGH (ref 70–99)
Glucose-Capillary: 194 mg/dL — ABNORMAL HIGH (ref 70–99)
Glucose-Capillary: 209 mg/dL — ABNORMAL HIGH (ref 70–99)
Glucose-Capillary: 239 mg/dL — ABNORMAL HIGH (ref 70–99)

## 2010-06-21 LAB — GLUCOSE, CAPILLARY
Glucose-Capillary: 264 mg/dL — ABNORMAL HIGH (ref 70–99)
Glucose-Capillary: 145 mg/dL — ABNORMAL HIGH (ref 70–99)
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
## 2010-07-22 NOTE — Consult Note (Signed)
Emily Cordova, LUPI NO.:  [id number]   MEDICAL RECORD NO.:  [id number]          PATIENT TYPE:  IPS   LOCATION:  0404                          FACILITY:  [facility]   PHYSICIAN:  Hollice Espy, M.D.DATE OF BIRTH:  1970/11/24   DATE OF CONSULTATION:  02/12/2004  DATE OF DISCHARGE:                                   CONSULTATION   REASON FOR CONSULTATION:  Diabetes management.   The patient is a 40 year old African-American female with a past medical  history of schizophrenia, depression, hypertension, diabetes, medical  noncompliance who was admitted on February 09, 2004 secondary to suicidal  ideations.  In regards to her medical history, she was diagnosed with  diabetes she says a few years ago.  She saw a doctor a long time ago and  used to be on a medicine, but has not taken that medicine for a long time.  She could not say how long.  She also had not been to see a doctor for quite  some time.  On admission, reportedly she was on Glucotrol 5 mg daily.  But,  again she has not been on this medication for quite some time.  The patient  was admitted and restarted on her Glucotrol.  Her CBG initially were in the  200's with a brief elevation to 300 and has remained in the mid-200's with a  range anywhere from 210 to 280.  The patient herself denies any complaints  such headaches, visual changes, dysphagia, chest pain, palpitations,  shortness of breath, wheeze, cough, abdominal pain, hematuria, dysuria,  constipation, diarrhea, focal extremity pain, weakness.  I have asked  her  about numbness in her hands or feet and she says she has some numbness on  the lateral aspect of her left thigh, but nothing in the leg or foot.  She  otherwise states she has no other problems.  She denies any problems with  vision.   PAST MEDICAL HISTORY:  1.  Hypertension.  2.  Diabetes.  3.  Depression.  4.  Schizophrenia.  5.  Tobacco abuse.  6.  Obesity.  7.  Medical  noncompliance.   MEDICATIONS:  I do not know which medications she has been on and has been  appropriately taking.  However, she is supposed to be on:  1.  Prolixin 25 mg.  2.  Cogentin 2 mg, I believe, daily.  3.  Glucotrol 5 mg daily.  4.  Reportedly, the patient is on an ACE inhibitor, Avapro.   Again, it is unclear as to which medications she has been on and has been  taking.   ALLERGIES:  The patient says she is allergic to HALDOL which apparently  causes extrapyramidal side effects.   SOCIAL HISTORY:  She denies any alcohol or drug use and does admit to  smoking up to a pack of cigarettes a day for many, many years.   FAMILY HISTORY:  Noted for diabetes.   PHYSICAL EXAMINATION:  VITAL SIGNS:  Temperature 98.4, pulse 80, respiratory  rate 20.  GENERAL APPEARANCE:  In general, she presents as alert and oriented and  in  no apparent distress.  HEENT:  Normocephalic, atraumatic.  Mucous membranes are moist.  NECK:  She has no carotid bruits.  HEART:  Regular rate and rhythm, 2/6 systolic ejection murmur.  LUNGS:  Clear to auscultation bilaterally.  ABDOMEN:  Soft, obese, nontender, positive bowel sounds.  EXTREMITIES:  Show no clubbing, cyanosis, no pitting edema.  She has no  evidence of any sensory deficits or diabetic neuropathy that I can  ascertain.   LABORATORY DATA:  Hemoglobin A1c is elevated at 9.3.  Her lipid panel shows  a cholesterol of 185, triglycerides of 176, HDL 46, LDL 103.  Her liver  function test's are all within normal limits.  Her TSH was normal at 1.981.  Her urine drug screen done on the day of admission was normal.  An alcohol  level done on admission was negative at less than 5.  Sodium was 133,  potassium 3.7, chloride 100, bicarb 24, BUN 6, creatinine 0.7, glucose 242.  Calcium is 9.7.  This is on the day of admission.  Her CBC was unremarkable  as well with slightly elevated platelet count of 465.   ASSESSMENT AND PLAN:  1.  Diabetes mellitus.   Poorly controlled and noncompliant.  I agree with      starting Glucotrol and given how resistant she is, I am going to      increase her to 5 mg p.o. b.i.d. and hold this dose even with high blood      sugars and watch.  If we need to, we may add an insulin sliding scale,      but for now would continue this.  In addition to her sliding scale, the      patient also needs a diabetic education and importantly close follow up      as an outpatient including annual serial ophthalmology exams.  2.  Hypertension.  She is on appropriately Avapro for blood pressure, as      well as protective effects from her diabetes.  3.  Tobacco abuse.  She needs tobacco counseling.  4.  Schizophrenia and depression with suicidal ideations, per psychiatry.  5.  Obesity.      Send   SKK/MEDQ  D:  02/12/2004  T:  02/13/2004  Job:  [id number]
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
## 2010-07-22 NOTE — H&P (Signed)
NAME:  Emily Cordova, TRICK                       ACCOUNT NO.:  [id number]   MEDICAL RECORD NO.:  [id number]                  PATIENT TYPE:   LOCATION:                                       FACILITY:  [facility]   PHYSICIAN:  Dalia Heading, M.D.               DATE OF BIRTH:  Mar 14, 1970   DATE OF ADMISSION:  DATE OF DISCHARGE:                                HISTORY & PHYSICAL   CHIEF COMPLAINT:  Cholelithiasis, biliary colic.   HISTORY OF PRESENT ILLNESS:  Patient is a 40 year old obese black female who  is referred for evaluation and treatment of biliary colic secondary to  cholelithiasis.  She has been having intermittent episodes of right upper  quadrant abdominal pain with radiation to the right flank, nausea, and  bloating for many months.  She does have some fatty food intolerance.  No  fever, chills, or jaundice has been noted.  She has had a chronic upper  respiratory infection which has since resolved.   PAST MEDICAL HISTORY:  1. Obesity.  2. Non-insulin-dependent diabetes mellitus.  3. Hypertension.  4. Migraine headaches.  5. Arthritis medication.  6. Reflux disease.   PAST SURGICAL HISTORY:  1. Kidney surgery.  2. Tubal ligation.   CURRENT MEDICATIONS:  A nerve pill.  Blood pressure medications.  Glucotrol.  Reflux medication.  Migraine pills.  Arthritis medications.   ALLERGIES:  PENICILLIN.   SOCIAL HISTORY:  Patient does smoke daily.  She denies any significant  alcohol use.  She denies any other cardiopulmonary difficulties or bleeding  disorders.   PHYSICAL EXAMINATION:  VITAL SIGNS:  She is afebrile.  Vital signs are  stable.  GENERAL:  Patient is a morbid obesity black female in no acute distress.  LUNGS:  Clear to auscultation with equal breath sounds bilaterally.  HEART:  Regular rate and rhythm without S3, S4, or murmurs.  ABDOMEN:  Soft with slight tenderness in the right upper quadrant to  palpation.  No hepatosplenomegaly, masses, or hernias are  identified.   Ultrasound of the gallbladder reveals cholelithiasis with a normal common  bile duct.   IMPRESSION:  Biliary colic, cholelithiasis.   PLAN:  Patient is scheduled for laparoscopic cholecystectomy on August 28, 2003.  The risks and benefits of the procedure, including bleeding,  infection, hepatobiliary injury, and the possibility of an open procedure  were fully explained to the patient, who gave informed consent.     ___________________________________________                                         Dalia Heading, M.D.   MAJ/MEDQ  D:  08/25/2003  T:  08/25/2003  Job:  [id number]   cc:   Madelin Rear. Sherwood Gambler, M.D.  [address]  [place]  [place] [address]  Fax: [phone]
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
## 2010-07-22 NOTE — H&P (Signed)
NAMEALLIENE, KLUGH NO.:  [id number]   MEDICAL RECORD NO.:  [id number]          PATIENT TYPE:  IPS   LOCATION:  0402                          [facility]ILITY[facility]   PHYSICIAN:  Geoffery Lyons, M.D.      DATE OF BIRTH:  1971-01-01   DATE OF ADMISSION:  03/08/2004  DATE OF DISCHARGE:                         PSYCHIATRIC ADMISSION ASSESSMENT   IDENTIFYING INFORMATION:  This is a 40 year old married African-American  female voluntarily admitted on March 08, 2004.   HISTORY OF PRESENT ILLNESS:  The patient presents with a history of  psychosis, having command hallucinations telling her to hurt herself.  The  patient reports that the last two weeks she had overdosed on an assortment  of medications.  She was seen in the emergency room and was sent home.  She  feels very empty and hopeless.  She has been crying all the time.  She has  been having difficultly sleeping.  Her appetite has been satisfactory.  The  patient is still currently reporting suicidal thoughts with command  hallucinations.  The patient's stressors are some marital conflict.   PAST PSYCHIATRIC HISTORY:  Second admission.  The patient was here in  December of 2005.  She was here on commitment.  The patient was threatening  to take her own life.  Also experiencing positive auditory hallucinations.  She was hospitalized at [facility] years ago.  Sees Dr. Thomasena Edis as an  outpatient in [place].   SOCIAL HISTORY:  This is a 40 year old married African-American female.  She  has three children, ages 65, 59 and 16.  Lives with her husband and  children.  On disability.  Has no criminal charges.   FAMILY HISTORY:  Brother with history of alcohol problems.  She states all  of her siblings have mental health issues.   ALCOHOL/DRUG HISTORY:  The patient smokes.  Denies any alcohol or drug use.   PRIMARY CARE PHYSICIAN:  Dr. Regino Schultze at [phone].   MEDICAL PROBLEMS:  Hypertension, non-insulin-dependent  diabetes.   MEDICATIONS:  Cogentin 0.5 mg b.i.d., Avapro 150 mg daily,  hydrochlorothiazide 12.5 mg daily, Zocor 10 mg at 9 p.m., Glucophage 500 mg  b.i.d., Seroquel 100 mg at 9 p.m., Lamictal 25 mg daily, Prolixin 25 mg  injection, last injection on December 14th, Klonopin 0.25 mg b.i.d., 0.5 mg  q.h.s.   ALLERGIES:  HALDOL.   PHYSICAL EXAMINATION:  The patient was assessed at [facility].  This is an overweight young female in no acute distress.  Depressed but in  no physical distress.  The patient's blood sugars in the emergency room were  353 and received 8 units of insulin.  Her temperature, on admission, was  100.4, pulse 115, respiratory rate 20, blood pressure 101/83.   LABORATORY DATA:  BUN 3, platelet count 459.  Alcohol level less than 5.  Urinalysis showed Trichomonas with a wbc count of 3-6.   MENTAL STATUS EXAM:  She is an alert, overweight, African-American female.  Cooperative.  Good eye contact.  Speech is clear.  The patient feels  depressed and hopeless.  Affect with patient very tearful.  Thought  processes with the patient expressing command hallucinations.  However, she  does not appear to be actively responding.  Expressing suicidal ideation  with a specific plan at this time on the unit.  Sees to have some resistance  at medications and think it will get better for her.  Cognitive function  intact.  Memory is fair.  Judgment and insight are fair.   DIAGNOSES:   AXIS I:  1.  Bipolar disorder not otherwise specified.  2.  Rule out schizoaffective disorder.   AXIS II:  Deferred.   AXIS III:  1.  Hypertension.  2.  Non-insulin-dependent diabetes.  3.  Obesity.   AXIS IV:  Problems with primary support group, other psychosocial problems,  medical problems.   AXIS V:  Current 25; estimated this past year 60.   PLAN:  Stabilize mood and thinking.  The patient to be placed on the 400  Hall for close monitoring and will require one-to-one for  safety.  The  patient has a specific plan and unable to contract.  Will resume her other  medications.  Will check her blood sugars b.i.d.  We will reinforce  medication compliance as the patient was noncompliant with her medications  for some period of time.  We will also initiate an antidepressant to  decrease depressive symptoms and have a family session with her husband for  support and discharge planning.  The patient is also to follow up with Dr.  Regino Schultze, who will be notified about patient's lab results, especially  patient's urinary tract infection to obtain recommendations.   TENTATIVE LENGTH OF STAY:  Five to seven days.      JO/MEDQ  D:  03/11/2004  T:  03/11/2004  Job:  [id number]
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
## 2010-07-22 NOTE — Op Note (Signed)
NAME:  Emily Cordova, Emily Cordova                       ACCOUNT NO.:  [id number]   MEDICAL RECORD NO.:  [id number]                   PATIENT TYPE:  AMB   LOCATION:  DAY                                  FACILITY:  [facility]   PHYSICIAN:  Dalia Heading, M.D.               DATE OF BIRTH:  1970-11-30   DATE OF PROCEDURE:  08/28/2003  DATE OF DISCHARGE:                                 OPERATIVE REPORT   PREOPERATIVE DIAGNOSIS:  Cholecystitis, cholelithiasis.   POSTOPERATIVE DIAGNOSIS:  Cholecystitis, cholelithiasis.   PROCEDURE:  Laparoscopic cholecystectomy.   SURGEON:  Dalia Heading, M.D.   ASSISTANT:  Bernerd Limbo. Leona Carry, M.D.   ANESTHESIA:  General endotracheal.   INDICATIONS FOR PROCEDURE:  The patient is a 40 year old black female who  presents with biliary colic secondary to cholelithiasis.  The risks and  benefits of the procedure, including bleeding, infection, hepatobiliary  injury, and the possibility of an open procedure were fully explained to the  patient who gave informed consent.   DESCRIPTION OF PROCEDURE:  The patient was placed in the supine position.  After induction of general endotracheal anesthesia, the abdomen was prepped  and draped using the usual sterile technique with Betadine.  Surgical site  confirmation was performed.   A supraumbilical incision was made down to the fascia.  A Veress needle was  introduced into the abdominal cavity, and confirmation of placement was done  using the saline drop test.  The abdomen was then insufflated to 16 mmHg  pressure.  An 11-mm trocar was introduced into the abdominal cavity under  direct visualization without difficulty.  The patient was placed in reverse  Trendelenburg position, and an additional 11-mm trocar was placed in the  epigastric region and 5-mm trocars were placed in the right upper quadrant  and right flank regions.  The liver was inspected and noted to be within  normal limits.  The gallbladder was retracted  superiorly and laterally.  The  dissection was begun around the infundibulum of the gallbladder.  The cystic  duct was first identified.  Its juncture to the infundibulum was fully  identified.  Endoclips were placed proximally and distally on the cystic  duct, and the cystic duct was divided.  This was likewise done on the cystic  artery.  The gallbladder was then freed away from the gallbladder fossa  using Bovie electrocautery.  The gallbladder was delivered through the  epigastric trocar site using an EndoCatch bag.  The gallbladder fossa was  inspected, and no abnormal bleeding or bile leakage was noted.  Surgicel was  placed in the gallbladder fossa.  All fluid and air were then evacuated from  the abdominal cavity prior to removal of the trocars.   All wounds were irrigated with normal saline.  All wounds were injected with  0.5% Sensorcaine.  The supraumbilical fascia was noted to be reapproximated.  The skin was closed using staples.  Betadine  ointment and dry sterile  dressings were applied.   All tape and needle counts were correct at the end of the procedure.  The  patient was extubated in the operating room and went back to the recovery  room awake and in stable condition.   COMPLICATIONS:  None.   SPECIMENS:  Gallbladder with stones.   ESTIMATED BLOOD LOSS:  Minimal.      ___________________________________________                                            Dalia Heading, M.D.   MAJ/MEDQ  D:  08/28/2003  T:  08/28/2003  Job:  [id number]   cc:   Madelin Rear. Sherwood Gambler, M.D.  [address]  [place]  [place] [address]  Fax: [phone]

## 2010-12-06 LAB — BASIC METABOLIC PANEL
BUN: 6
Calcium: 9.3
GFR calc non Af Amer: >60
Glucose, Bld: 277 — ABNORMAL HIGH

## 2010-12-06 LAB — DIFFERENTIAL
Basophils Absolute: 0
Basophils Relative: 0
Eosinophils Relative: 3
Lymphocytes Relative: 25
Neutro Abs: 7.4

## 2010-12-06 LAB — CBC
HCT: 37.6
MCHC: 34.8
Platelets: 347
RDW: 13

## 2010-12-06 LAB — POCT CARDIAC MARKERS: Troponin i, poc: <0.05

## 2010-12-06 LAB — D-DIMER, QUANTITATIVE: D-Dimer, Quant: 0.3

## 2011-03-13 ENCOUNTER — Other Ambulatory Visit (HOSPITAL_COMMUNITY): Payer: Self-pay | Admitting: Internal Medicine

## 2011-03-13 DIAGNOSIS — Z139 Encounter for screening, unspecified: Secondary | ICD-10-CM

## 2011-03-16 ENCOUNTER — Ambulatory Visit (HOSPITAL_COMMUNITY): Payer: Medicaid Other

## 2011-03-27 ENCOUNTER — Other Ambulatory Visit: Payer: Self-pay | Admitting: Adult Health

## 2011-03-27 ENCOUNTER — Other Ambulatory Visit (HOSPITAL_COMMUNITY)
Admission: RE | Admit: 2011-03-27 | Discharge: 2011-03-27 | Disposition: A | Discharge status: Home / Self Care | Payer: Medicaid Other | Source: Ambulatory Visit | Attending: Obstetrics and Gynecology | Admitting: Obstetrics and Gynecology

## 2011-03-27 DIAGNOSIS — Z139 Encounter for screening, unspecified: Secondary | ICD-10-CM

## 2011-03-27 DIAGNOSIS — Z01419 Encounter for gynecological examination (general) (routine) without abnormal findings: Secondary | ICD-10-CM | POA: Insufficient documentation

## 2011-03-30 ENCOUNTER — Ambulatory Visit (HOSPITAL_COMMUNITY)
Admission: RE | Admit: 2011-03-30 | Discharge: 2011-03-30 | Disposition: A | Discharge status: Home / Self Care | Payer: Medicaid Other | Source: Ambulatory Visit | Attending: Adult Health | Admitting: Adult Health

## 2011-03-30 DIAGNOSIS — Z139 Encounter for screening, unspecified: Secondary | ICD-10-CM

## 2011-03-30 DIAGNOSIS — Z1231 Encounter for screening mammogram for malignant neoplasm of breast: Secondary | ICD-10-CM | POA: Insufficient documentation

## 2011-04-05 ENCOUNTER — Other Ambulatory Visit: Payer: Self-pay | Admitting: Adult Health

## 2011-04-05 DIAGNOSIS — R928 Other abnormal and inconclusive findings on diagnostic imaging of breast: Secondary | ICD-10-CM

## 2011-04-12 ENCOUNTER — Ambulatory Visit (HOSPITAL_COMMUNITY)
Admission: RE | Admit: 2011-04-12 | Discharge: 2011-04-12 | Disposition: A | Discharge status: Home / Self Care | Payer: Medicaid Other | Source: Ambulatory Visit | Attending: Adult Health | Admitting: Adult Health

## 2011-04-12 ENCOUNTER — Other Ambulatory Visit: Payer: Self-pay | Admitting: Adult Health

## 2011-04-12 DIAGNOSIS — R928 Other abnormal and inconclusive findings on diagnostic imaging of breast: Secondary | ICD-10-CM | POA: Insufficient documentation

## 2011-04-21 ENCOUNTER — Other Ambulatory Visit (HOSPITAL_COMMUNITY): Payer: Self-pay | Admitting: "Endocrinology

## 2011-04-21 DIAGNOSIS — E049 Nontoxic goiter, unspecified: Secondary | ICD-10-CM

## 2011-04-25 ENCOUNTER — Ambulatory Visit (HOSPITAL_COMMUNITY)
Admission: RE | Admit: 2011-04-25 | Discharge: 2011-04-25 | Disposition: A | Discharge status: Home / Self Care | Payer: Medicaid Other | Source: Ambulatory Visit | Attending: "Endocrinology | Admitting: "Endocrinology

## 2011-04-25 DIAGNOSIS — E049 Nontoxic goiter, unspecified: Secondary | ICD-10-CM | POA: Insufficient documentation

## 2011-05-30 ENCOUNTER — Emergency Department (HOSPITAL_COMMUNITY)
Admission: EM | Admit: 2011-05-30 | Discharge: 2011-06-01 | Disposition: A | Discharge status: Home / Self Care | Payer: 59 | Attending: Emergency Medicine | Admitting: Emergency Medicine

## 2011-05-30 ENCOUNTER — Encounter (HOSPITAL_COMMUNITY): Payer: Self-pay | Admitting: Emergency Medicine

## 2011-05-30 DIAGNOSIS — R739 Hyperglycemia, unspecified: Secondary | ICD-10-CM

## 2011-05-30 DIAGNOSIS — Z794 Long term (current) use of insulin: Secondary | ICD-10-CM | POA: Insufficient documentation

## 2011-05-30 DIAGNOSIS — Z79899 Other long term (current) drug therapy: Secondary | ICD-10-CM | POA: Insufficient documentation

## 2011-05-30 DIAGNOSIS — Z8659 Personal history of other mental and behavioral disorders: Secondary | ICD-10-CM | POA: Insufficient documentation

## 2011-05-30 DIAGNOSIS — F29 Unspecified psychosis not due to a substance or known physiological condition: Secondary | ICD-10-CM

## 2011-05-30 DIAGNOSIS — E119 Type 2 diabetes mellitus without complications: Secondary | ICD-10-CM | POA: Insufficient documentation

## 2011-05-30 DIAGNOSIS — F319 Bipolar disorder, unspecified: Secondary | ICD-10-CM | POA: Insufficient documentation

## 2011-05-30 DIAGNOSIS — R197 Diarrhea, unspecified: Secondary | ICD-10-CM | POA: Insufficient documentation

## 2011-05-30 DIAGNOSIS — E7800 Pure hypercholesterolemia, unspecified: Secondary | ICD-10-CM | POA: Insufficient documentation

## 2011-05-30 LAB — COMPREHENSIVE METABOLIC PANEL
Albumin: 3.9 g/dL (ref 3.5–5.2)
Alkaline Phosphatase: 112 U/L (ref 39–117)
BUN: 18 mg/dL (ref 6–23)
CO2: 25 mEq/L (ref 19–32)
Chloride: 98 mEq/L (ref 96–112)
Creatinine, Ser: 1.04 mg/dL (ref 0.50–1.10)
GFR calc non Af Amer: 66 mL/min — ABNORMAL LOW (ref >90)
Potassium: 3.2 mEq/L — ABNORMAL LOW (ref 3.5–5.1)
Total Bilirubin: 0.2 mg/dL — ABNORMAL LOW (ref 0.3–1.2)

## 2011-05-30 LAB — CBC
Hemoglobin: 13.2 g/dL (ref 12.0–15.0)
MCH: 29.3 pg (ref 26.0–34.0)
MCHC: 32.6 g/dL (ref 30.0–36.0)
Platelets: 324 10*3/uL (ref 150–400)

## 2011-05-30 LAB — GLUCOSE, CAPILLARY
Glucose-Capillary: 238 mg/dL — ABNORMAL HIGH (ref 70–99)
Glucose-Capillary: 163 mg/dL — ABNORMAL HIGH (ref 70–99)

## 2011-05-30 LAB — DIFFERENTIAL
Basophils Relative: 0 % (ref 0–1)
Lymphocytes Relative: 16 % (ref 12–46)
Lymphs Abs: 2.1 10*3/uL (ref 0.7–4.0)
Monocytes Absolute: 0.9 10*3/uL (ref 0.1–1.0)
Monocytes Relative: 7 % (ref 3–12)
Neutro Abs: 9.5 10*3/uL — ABNORMAL HIGH (ref 1.7–7.7)
Neutrophils Relative %: 73 % (ref 43–77)

## 2011-05-30 LAB — ETHANOL: Alcohol, Ethyl (B): <11 mg/dL (ref 0–11)

## 2011-05-30 LAB — LITHIUM LEVEL: Lithium Lvl: 2.08 mEq/L (ref 0.80–1.40)

## 2011-05-30 MED ORDER — INSULIN ASPART 100 UNIT/ML ~~LOC~~ SOLN
0.0000 [IU] | Freq: Three times a day (TID) | SUBCUTANEOUS | Status: DC
  Administered 2011-05-30 – 2011-06-01 (×2): 4 [IU] via SUBCUTANEOUS
  Administered 2011-06-01: 7 [IU] via SUBCUTANEOUS
  Filled 2011-05-30 (×3): qty 1

## 2011-05-30 MED ORDER — LITHIUM CARBONATE 300 MG PO CAPS: 900.0000 mg | ORAL_CAPSULE | Freq: Every day | ORAL | Status: DC

## 2011-05-30 MED ORDER — RISPERIDONE 1 MG PO TABS
ORAL_TABLET | ORAL | Status: AC
  Filled 2011-05-30: qty 2

## 2011-05-30 MED ORDER — ALUM & MAG HYDROXIDE-SIMETH 200-200-20 MG/5ML PO SUSP: 30.0000 mL | ORAL | Status: DC | PRN

## 2011-05-30 MED ORDER — METOPROLOL TARTRATE 12.5 MG HALF TABLET
12.5000 mg | ORAL_TABLET | Freq: Two times a day (BID) | ORAL | Status: DC
  Administered 2011-05-30 – 2011-06-01 (×3): 12.5 mg via ORAL
  Filled 2011-05-30 (×10): qty 1

## 2011-05-30 MED ORDER — POTASSIUM CHLORIDE CRYS ER 20 MEQ PO TBCR
40.0000 meq | EXTENDED_RELEASE_TABLET | Freq: Two times a day (BID) | ORAL | Status: DC
Start: 2011-05-30 — End: 2011-06-01
  Administered 2011-05-30 – 2011-06-01 (×3): 40 meq via ORAL
  Filled 2011-05-30 (×3): qty 2

## 2011-05-30 MED ORDER — INSULIN ASPART PROT & ASPART (70-30 MIX) 100 UNIT/ML ~~LOC~~ SUSP
40.0000 [IU] | Freq: Two times a day (BID) | SUBCUTANEOUS | Status: DC
  Administered 2011-05-30 – 2011-06-01 (×2): 40 [IU] via SUBCUTANEOUS
  Filled 2011-05-30 (×2): qty 3

## 2011-05-30 MED ORDER — BENZTROPINE MESYLATE 1 MG PO TABS
0.5000 mg | ORAL_TABLET | Freq: Two times a day (BID) | ORAL | Status: DC
  Administered 2011-05-30 – 2011-05-31 (×3): 0.5 mg via ORAL
  Administered 2011-06-01: 1.5 mg via ORAL
  Filled 2011-05-30 (×5): qty 1

## 2011-05-30 MED ORDER — IBUPROFEN 400 MG PO TABS: 600.0000 mg | ORAL_TABLET | Freq: Three times a day (TID) | ORAL | Status: DC | PRN

## 2011-05-30 MED ORDER — ACETAMINOPHEN 325 MG PO TABS: 650.0000 mg | ORAL_TABLET | ORAL | Status: DC | PRN

## 2011-05-30 MED ORDER — RISPERIDONE 1 MG PO TABS
2.0000 mg | ORAL_TABLET | Freq: Every day | ORAL | Status: DC
  Administered 2011-05-30 – 2011-05-31 (×2): 2 mg via ORAL
  Filled 2011-05-30: qty 1
  Filled 2011-05-30 (×2): qty 2
  Filled 2011-05-30: qty 1

## 2011-05-30 MED ORDER — SODIUM CHLORIDE 0.9 % IV SOLN: INTRAVENOUS | Status: DC

## 2011-05-30 MED ORDER — OLANZAPINE 5 MG PO TABS
10.0000 mg | ORAL_TABLET | Freq: Every day | ORAL | Status: DC
  Administered 2011-06-01: 10 mg via ORAL
  Filled 2011-05-30: qty 2
  Filled 2011-05-30 (×2): qty 1
  Filled 2011-05-30 (×2): qty 2

## 2011-05-30 MED ORDER — LORAZEPAM 1 MG PO TABS
1.0000 mg | ORAL_TABLET | Freq: Three times a day (TID) | ORAL | Status: DC | PRN
  Administered 2011-06-01: 1 mg via ORAL
  Filled 2011-05-30: qty 1

## 2011-05-30 MED ORDER — METOPROLOL TARTRATE 25 MG PO TABS
ORAL_TABLET | ORAL | Status: AC
  Filled 2011-05-30: qty 1

## 2011-05-30 MED ORDER — OLANZAPINE 5 MG PO TABS
20.0000 mg | ORAL_TABLET | Freq: Every day | ORAL | Status: DC
  Administered 2011-05-30 – 2011-05-31 (×2): 20 mg via ORAL
  Filled 2011-05-30: qty 4
  Filled 2011-05-30: qty 2
  Filled 2011-05-30 (×2): qty 4
  Filled 2011-05-30: qty 2

## 2011-05-30 MED ORDER — OLANZAPINE 10 MG PO TABS: 10.0000 mg | ORAL_TABLET | Freq: Two times a day (BID) | ORAL | Status: DC

## 2011-05-30 MED ORDER — SODIUM CHLORIDE 0.9 % IV BOLUS (SEPSIS): 2000.0000 mL | Freq: Once | INTRAVENOUS | Status: DC

## 2011-05-30 MED ORDER — METFORMIN HCL 500 MG PO TABS
1000.0000 mg | ORAL_TABLET | Freq: Two times a day (BID) | ORAL | Status: DC
Start: 2011-05-30 — End: 2011-06-01
  Administered 2011-05-30: 1000 mg via ORAL
  Administered 2011-05-31: 500 mg via ORAL
  Administered 2011-06-01 (×2): 1000 mg via ORAL
  Filled 2011-05-30 (×4): qty 2

## 2011-05-30 MED ORDER — ONDANSETRON HCL 4 MG PO TABS: 4.0000 mg | ORAL_TABLET | Freq: Three times a day (TID) | ORAL | Status: DC | PRN

## 2011-05-30 MED ORDER — NICOTINE 21 MG/24HR TD PT24
21.0000 mg | MEDICATED_PATCH | Freq: Every day | TRANSDERMAL | Status: DC
  Filled 2011-05-30 (×2): qty 1

## 2011-05-30 MED ORDER — LISINOPRIL 10 MG PO TABS
40.0000 mg | ORAL_TABLET | Freq: Every day | ORAL | Status: DC
  Administered 2011-05-30 – 2011-06-01 (×2): 40 mg via ORAL
  Filled 2011-05-30 (×6): qty 4

## 2011-05-30 MED ORDER — BENZTROPINE MESYLATE 1 MG PO TABS
1.0000 mg | ORAL_TABLET | Freq: Two times a day (BID) | ORAL | Status: DC
  Administered 2011-05-30 – 2011-05-31 (×3): 1 mg via ORAL
  Filled 2011-05-30 (×5): qty 1

## 2011-05-30 MED ORDER — ZOLPIDEM TARTRATE 5 MG PO TABS: 10.0000 mg | ORAL_TABLET | Freq: Every evening | ORAL | Status: DC | PRN

## 2011-05-30 MED ORDER — LOPERAMIDE HCL 2 MG PO CAPS: 2.0000 mg | ORAL_CAPSULE | Freq: Four times a day (QID) | ORAL | Status: DC | PRN

## 2011-05-30 MED ORDER — ZIPRASIDONE MESYLATE 20 MG IM SOLR: 20.0000 mg | Freq: Every day | INTRAMUSCULAR | Status: DC | PRN

## 2011-05-30 MED ORDER — LITHIUM CARBONATE 300 MG PO CAPS: 600.0000 mg | ORAL_CAPSULE | Freq: Every day | ORAL | Status: DC

## 2011-05-30 MED ORDER — OLANZAPINE 5 MG PO TABS
ORAL_TABLET | ORAL | Status: AC
  Filled 2011-05-30: qty 4

## 2011-05-30 MED ORDER — LITHIUM CARBONATE 300 MG PO CAPS: 300.0000 mg | ORAL_CAPSULE | Freq: Two times a day (BID) | ORAL | Status: DC

## 2011-05-30 NOTE — ED Notes (Signed)
Pt changed to paper scrubs, belongings placed in bags and secured. Wanded by security. Daughter brought back to room.

## 2011-05-30 NOTE — ED Notes (Signed)
Pt states went to [facility] yesterday to get meds changed. Pt states having hallucinations seeing people in woods. Pt states they are not telling her to hurt herself or others just talking to her. Pt denies Hi/SI

## 2011-05-30 NOTE — ED Notes (Signed)
[facility] called and papers faxed.  Dr. Berlin Hun to return call to Dr. Lynelle Doctor.

## 2011-05-30 NOTE — ED Notes (Signed)
Dr. Berlin Hun returned call to Dr. Lynelle Doctor.  Monitor in place and ready for consult with Pt.
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
## 2011-05-30 NOTE — BH Assessment (Signed)
Assessment Note   Emily Cordova is an 41 y.o. female. Emily Cordova was brought to the ED this morning, after calling the [employer] and telling them that someone was trying to kill her. She continues to be delusional. She talks about someone trying to kill her. She then says that her brother is trying to kill her.Marland Kitchen Spoke with Emily Cordova of the [facility] [employer]. She informed us that Emily Cordova had been doing well since discharge from [facility]. She was a patient there for over 6 months. Since she has been home she was followed daily for medications. She has done well and the frequency of follow up has been decreased. Patient in Emily Cordova 05/29/11 and it was noted that she had begun to decompensate and that some of her medications were missing from her pill box. It is felt that she has decompensated  Due to this.   Today she is delusional, thinking that someone is trying to kill her. She denies any thoughts to harm herself or others. She is alert and orient to place and person. She is slightly agitated but remains cooperative. Axis I: Schizoaffective Disorder Axis II: Deferred Axis III: History reviewed. No pertinent past medical history. Axis IV: housing problems, other psychosocial or environmental problems, problems related to legal system/crime and problems related to social environment Axis V: 11-20 some danger of hurting self or others possible OR occasionally fails to maintain minimal personal hygiene OR gross impairment in communication  Past Medical History: History reviewed. No pertinent past medical history.  History reviewed. No pertinent past surgical history.  Family History: History reviewed. No pertinent family history.  Social History:  reports that she has been smoking.  She does not have any smokeless tobacco history on file. She reports that she does not drink alcohol. Her drug history not on file.  Additional Social History:    Allergies:  Allergies  Allergen Reactions  . Haldol (Haloperidol  Decanoate)     Home Medications:  No current facility-administered medications on file as of 05/30/2011.   No current outpatient prescriptions on file as of 05/30/2011.    OB/GYN Status:  Patient's last menstrual period was 05/02/2011.  General Assessment Data Location of Assessment: [facility] ACT Assessment: Yes Living Arrangements: Children Can pt return to current living arrangement?: Yes Admission Status: Involuntary Is patient capable of signing voluntary admission?: No Transfer from: Acute Hospital Referral Source: MD  Education Status Is patient currently in school?: No  Risk to self Suicidal Ideation: No Suicidal Intent: No Is patient at risk for suicide?: No Suicidal Plan?: No Access to Means: No What has been your use of drugs/alcohol within the last 12 months?: denies Previous Attempts/Gestures: No Triggers for Past Attempts: None known Intentional Self Injurious Behavior: None Family Suicide History: No Recent stressful life event(s): Recent negative physical changes Persecutory voices/beliefs?: No Depression: Yes Depression Symptoms: Loss of interest in usual pleasures;Insomnia Substance abuse history and/or treatment for substance abuse?: No Suicide prevention information given to non-admitted patients: Not applicable  Risk to Others Homicidal Ideation: No Thoughts of Harm to Others: No Current Homicidal Intent: No Current Homicidal Plan: No Access to Homicidal Means: No History of harm to others?: No Assessment of Violence: In past 6-12 months Violent Behavior Description: combative;cursing on past admission Does patient have access to weapons?: No Criminal Charges Pending?: No Does patient have a court date: Yes Court Date:  (patient unable to provide)  Psychosis Hallucinations: None noted Delusions: Persecutory (thinking someone is going to  kill her;thinks brother is Passenger transport manager)  Mental Status Report Appear/Hygiene: Improved Eye Contact: Fair Motor  Activity: Restlessness Speech: Argumentative;Pressured Level of Consciousness: Alert;Restless Mood: Depressed;Suspicious;Irritable Affect: Blunted Anxiety Level: None Thought Processes: Coherent;Tangential Judgement: Impaired Orientation: Place;Person Obsessive Compulsive Thoughts/Behaviors: Minimal  Cognitive Functioning Concentration: Decreased Memory: Recent Intact;Remote Intact IQ: Average Insight: Poor Impulse Control: Poor Appetite: Good Sleep: Decreased Vegetative Symptoms: None  Prior Inpatient Therapy Prior Inpatient Therapy: Yes Prior Therapy Dates: last asdmission to [facility] for 6 months Prior Therapy Facilty/Provider(s): [facility] Reason for Treatment: psychotic  Prior Outpatient Therapy Prior Outpatient Therapy: Yes Prior Therapy Facilty/Provider(s): [facility] Reason for Treatment: medications;ACTT Team            Values / Beliefs Cultural Requests During Hospitalization: None Spiritual Requests During Hospitalization: None        Additional Information 1:1 In Past 12 Months?: Yes CIRT Risk: Yes Elopement Risk: No Does patient have medical clearance?: Yes     Disposition: Patient was made IVC. She is referred to [facility], [facility], and [facility]. Dr Lynelle Doctor is in agreement with this plan. Disposition Disposition of Patient: Inpatient treatment program Type of inpatient treatment program: Adult  On Site Evaluation by:   Reviewed with Physician:     Emily Cordova 05/30/2011 12:45 PM

## 2011-05-30 NOTE — ED Notes (Signed)
Pt refuses IV for IV fluids. Water taken to pt and importance of taking in fluids explained.

## 2011-05-30 NOTE — ED Notes (Signed)
Pt did not get urine specimen as requested.

## 2011-05-30 NOTE — ED Notes (Signed)
Given 20 oz diet Sprite (pt has drunk 24 oz po fluid so far) and encouraged to drink.  Cooperative and calm at present with sitter at bedside and agreeable to plan of care.

## 2011-05-30 NOTE — ED Notes (Signed)
Pt states that "my brother is following me and trying to kill me." Pt alert and oriented x 3. Skin warm and dry. Color pink. Pt denies homicidal or suicidal ideations at this time. Pt sitting in chair calm and cooperative at this time.

## 2011-05-30 NOTE — ED Notes (Signed)
Meds requested for admin as ordered.

## 2011-05-30 NOTE — ED Notes (Signed)
Pt sitting up in chair sleeping. Meds off schedule due to pt's refusal to take them as scheduled. EDP informed of pt's status.

## 2011-05-30 NOTE — ED Notes (Signed)
Pt refuses to discuss reason she came to ED. States "you have all my records, I'm not talking to anybody else about this." Pt's cell phone given to daughter.

## 2011-05-30 NOTE — ED Notes (Signed)
Pt says she is unable to void at present.

## 2011-05-30 NOTE — ED Notes (Signed)
Pt refusing EKG. Refuses to allow treatment procedures or meds at this time.

## 2011-05-30 NOTE — ED Notes (Signed)
CRITICAL VALUE ALERT  Critical value received:  Lithium 2.08  Date of notification:  05/30/2011  Time of notification:  1750  Critical value read back:yes  Nurse who received alert:  Santiago Bur, RN  MD notified (1st page):   Time of first page:    MD notified (2nd page):  Time of second page:  Responding MD:  Dr. Mancel Bale  Time MD responded:  [phone]

## 2011-05-30 NOTE — ED Notes (Signed)
Pt up to bathroom.

## 2011-05-30 NOTE — ED Provider Notes (Signed)
Her lithium level returned to 0.08, high. No history of overdose or intentional ingestions to be concerning for acute toxicity. Electrolytes reviewed, mild, low daily with a 3.2, creatinine, is normal at 1.04, glucose, high at 205. We'll check EKG.  I contacted [facility] poison control who recommends IV hydration and recheck lithium level in 4 hours.  IV fluids were ordered: 16:23  Patient would not allow [place] to place, IV. She agreed to eat and drink some.  She has been evaluated by  [facility]  Will repeat lithium (0100) I doubt that she will need dialysis. She has remained with a stable neurologic exam and no indication of acute lithium toxicity. Psychiatrically, she has remained somewhat delusional, and difficult to manage.    Flint Melter, MD 05/31/11 [phone]

## 2011-05-30 NOTE — ED Notes (Signed)
Daughter brought in patients belongings. They are in a white nike bag, labeled with a patient sticker and placed in secure locker. Daughter states that she is leaving and would like for [place] to let her know if she gets transported to another facility.

## 2011-05-30 NOTE — ED Notes (Signed)
Patient asked for a snack. Patient did not want peanut butter crackers that were offered. Patient requested ice cream. We do not have sugar free ice cream, patient is a diabetic. Patient informed and states understanding.

## 2011-05-30 NOTE — ED Notes (Signed)
Pt refused nicotine patch.

## 2011-05-31 LAB — PREGNANCY, URINE: Preg Test, Ur: NEGATIVE

## 2011-05-31 LAB — RAPID URINE DRUG SCREEN, HOSP PERFORMED
Amphetamines: NOT DETECTED
Tetrahydrocannabinol: NOT DETECTED

## 2011-05-31 LAB — LITHIUM LEVEL
Lithium Lvl: 1.92 mEq/L (ref 0.80–1.40)
Lithium Lvl: 1.85 mEq/L (ref 0.80–1.40)

## 2011-05-31 LAB — URINALYSIS, ROUTINE W REFLEX MICROSCOPIC
Glucose, UA: NEGATIVE mg/dL
Hgb urine dipstick: NEGATIVE
Protein, ur: 100 mg/dL — AB
pH: 5.5 (ref 5.0–8.0)

## 2011-05-31 LAB — URINE MICROSCOPIC-ADD ON

## 2011-05-31 MED ORDER — METFORMIN HCL 500 MG PO TABS
ORAL_TABLET | ORAL | Status: AC
  Administered 2011-05-31: 500 mg via ORAL
  Filled 2011-05-31: qty 1

## 2011-05-31 MED ORDER — METOPROLOL TARTRATE 25 MG PO TABS
ORAL_TABLET | ORAL | Status: AC
  Filled 2011-05-31: qty 1

## 2011-05-31 NOTE — ED Notes (Signed)
Pt refused to have blood sugar checked and pt refused all po meds this am. EDP stated to hold the insulin. Pt given breakfast tray.

## 2011-05-31 NOTE — ED Notes (Signed)
Completed Lab work faxed to [facility].

## 2011-05-31 NOTE — ED Notes (Signed)
Pt refuses for CBG to be done, when asked why she refuses CBG, pt stated "It's been done already, a couple of times today", pt also asking about if she is going home today or not

## 2011-05-31 NOTE — ED Notes (Signed)
CRITICAL VALUE ALERT  Critical value received:  Lithium 1.92   Date of notification:  05-31-11  Time of notification:  0127  Critical value read back:yes  Nurse who received alert:  Alfredo Martinez RN  MD notified (1st page):  [place]  Time of first page:  0129  MD notified (2nd page):  Time of second page:  Responding MD:  Estell Harpin  Time MD responded:  [phone]

## 2011-05-31 NOTE — ED Notes (Signed)
Pt finally agreed to have blood work drawn this am.

## 2011-05-31 NOTE — ED Notes (Signed)
Encouraged pt to allow [place] to place IV access to enable [place] to give her IVF in order to bring down her lithium level.  Pt refuses, states, "I ain't getting no IV and I'm not drinking anything else either.  If I die, it will be on this hospital".  Informed pt that we are trying to prevent her from becoming sick, and provide her with proper care.  Continues to refuse and states, "there is nothing wrong with my lithuim".  Tried to explain to pt that her level is critically high, but continues to be argumentative.

## 2011-05-31 NOTE — ED Notes (Signed)
Pt assisted to the restroom and continues to refuse to give [place] a urine sample. Pt also continues to refuse the nicotine patch and iv at this time.

## 2011-05-31 NOTE — ED Notes (Signed)
Attempting to get urine sample at this time

## 2011-05-31 NOTE — ED Notes (Signed)
Per Karen Chafe, RN, pt refused further labs, EKG, and meds.

## 2011-05-31 NOTE — ED Notes (Signed)
Spoke with Larita Fife at [facility]. Stated needing a Lithium level, UDS,UA,Urine Preg. Nurse informed and labs ordered and obtained.

## 2011-05-31 NOTE — ED Notes (Signed)
Central called back and stated that UA, UDS and urine pregnancy needed with Lithium level as well

## 2011-05-31 NOTE — ED Notes (Signed)
Pt sitting in chair, watching t.v.  Calm, cooperative.  Took meds without difficulty.  Sitter at bedside, nad noted.

## 2011-05-31 NOTE — ED Notes (Signed)
CRITICAL VALUE ALERT  Critical value received:  Lithium 1.93  Date of notification:  05/31/2011  Time of notification:  0955  Critical value read back:yes  Nurse who received alert:  kbelton,rn  MD notified (1st page):  Dr. Adriana Simas  Time of first page:  0955  MD notified (2nd page):  Time of second page:  Responding MD:  Dr. Adriana Simas  Time MD responded:  [phone]

## 2011-05-31 NOTE — ED Notes (Signed)
Pt cooperative at this time, allowed RN to check cbg, sitter at bedside
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
## 2011-05-31 NOTE — ED Notes (Signed)
Pt started walking out of dept and had told sitter that she had an appt and was going home, pt assisted back to room, pt refused CBG but was willing to take Metformin, which was given

## 2011-05-31 NOTE — ED Notes (Signed)
After carrying patient to the restroom, she acted if she was too confused to use the toilet. Patient knows that a urine sample is needed. Once back in the room patient urinated in the floor. Notified RN of patients actions.

## 2011-05-31 NOTE — ED Notes (Signed)
Spoke with Larita Fife at [facility]

## 2011-05-31 NOTE — ED Provider Notes (Signed)
History     [name]: [id number]  Arrival date & time 05/30/11  4098   First MD Initiated Contact with Patient 05/30/11 1002      Chief Complaint  Patient presents with  . Hallucinations    (Consider location/radiation/quality/duration/timing/severity/associated sxs/prior treatment) HPI  History reviewed. No pertinent past medical history.  History reviewed. No pertinent past surgical history.  History reviewed. No pertinent family history.  History  Substance Use Topics  . Smoking status: Current Everyday Smoker -- 1.0 packs/day  . Smokeless tobacco: Not on file  . Alcohol Use: No    OB History    Grav Para Term Preterm Abortions TAB SAB Ect Mult Living                  Review of Systems  Allergies  Haldol  Home Medications   Current Outpatient Rx  Name Route Sig Dispense Refill  . BENZTROPINE MESYLATE 0.5 MG PO TABS Oral Take 0.5 mg by mouth 2 (two) times daily. Takes with 1mg  tablet twice daily to equal dose of 1.5mg     . BENZTROPINE MESYLATE 1 MG PO TABS Oral Take 1 mg by mouth 2 (two) times daily. Takes with 0.5mg  tablet to equal 1.5mg  dose twice daily    . CHOLECALCIFEROL 400 UNITS PO TABS Oral Take 800 Units by mouth every morning.    Marland Kitchen HYDROCHLOROTHIAZIDE 25 MG PO TABS Oral Take 25 mg by mouth every morning.    . INSULIN ASPART PROT & ASPART (70-30) 100 UNIT/ML [place] SUSP Subcutaneous Inject 40 Units into the skin 2 (two) times daily with a meal.    . LISINOPRIL 40 MG PO TABS Oral Take 40 mg by mouth daily.    Marland Kitchen LITHIUM CARBONATE 300 MG PO CAPS Oral Take 600-900 mg by mouth 2 (two) times daily. 600mg  every morning (2 capsules) and 900mg  every evening (3 capsules).    . METFORMIN HCL 1000 MG PO TABS Oral Take 1,000 mg by mouth 2 (two) times daily with a meal.    . METOPROLOL TARTRATE 25 MG PO TABS Oral Take 12.5 mg by mouth 2 (two) times daily.    Marland Kitchen OLANZAPINE 10 MG PO TABS Oral Take 10-20 mg by mouth 2 (two) times daily. Takes 1 tablet in the AM and takes 2  tablets at bedtime    . OMEGA-3-ACID ETHYL ESTERS 1 G PO CAPS Oral Take 1 g by mouth 2 (two) times daily.    Hinda Glatter SUSTENNA IM Intramuscular Inject 117 mg into the muscle every 30 (thirty) days.    Marland Kitchen SIMVASTATIN 20 MG PO TABS Oral Take 20 mg by mouth every evening.      BP 113/56  Pulse 74  Temp(Src) 98.3 F (36.8 C) (Oral)  Resp 24  Ht 5\' 4"  (1.626 m)  Wt 250 lb (113.399 kg)  BMI 42.91 kg/m2  SpO2 100%  LMP 05/02/2011  Physical Exam  ED Course  Procedures (including critical care time)  Labs Reviewed  CBC - Abnormal; Notable for the following:    WBC 13.1 (*)    All other components within normal limits  DIFFERENTIAL - Abnormal; Notable for the following:    Neutro Abs 9.5 (*)    All other components within normal limits  COMPREHENSIVE METABOLIC PANEL - Abnormal; Notable for the following:    Sodium 132 (*)    Potassium 3.2 (*)    Glucose, Bld 205 (*)    Calcium 10.6 (*)    Total Bilirubin 0.2 (*)  GFR calc non Af Amer 66 (*)    GFR calc Af Amer 76 (*)    All other components within normal limits  GLUCOSE, CAPILLARY - Abnormal; Notable for the following:    Glucose-Capillary 238 (*)    All other components within normal limits  LITHIUM LEVEL - Abnormal; Notable for the following:    Lithium Lvl 2.08 (*)    All other components within normal limits  GLUCOSE, CAPILLARY - Abnormal; Notable for the following:    Glucose-Capillary 163 (*)    All other components within normal limits  GLUCOSE, CAPILLARY - Abnormal; Notable for the following:    Glucose-Capillary 109 (*)    All other components within normal limits  LITHIUM LEVEL - Abnormal; Notable for the following:    Lithium Lvl 1.92 (*)    All other components within normal limits  GLUCOSE, CAPILLARY - Abnormal; Notable for the following:    Glucose-Capillary 168 (*)    All other components within normal limits  ETHANOL  URINE RAPID DRUG SCREEN (HOSP PERFORMED)  LITHIUM LEVEL   No results found.   1.  Psychosis   2. Hyperglycemia    2100 discussed case with behavioral health. They will continue to attempt to find placement for this patient   MDM   Patient is agitated requiring [place]. Needs admission       Donnetta Hutching, MD 06/13/11 1705

## 2011-05-31 NOTE — ED Notes (Signed)
Patient out of room because baby is crying in department.  Patient assured that baby is ok.  Patient returned to room without incident.

## 2011-05-31 NOTE — ED Notes (Signed)
Pt refused to take lisinopril, metoprolol, potassium, zyprexa and nicotine patch.

## 2011-05-31 NOTE — ED Notes (Signed)
Pt refused to let me check her blood sugar

## 2011-06-01 LAB — GLUCOSE, CAPILLARY: Glucose-Capillary: 156 mg/dL — ABNORMAL HIGH (ref 70–99)

## 2011-06-01 NOTE — ED Notes (Signed)
Lab called critical lithium of 1.64. Result read back and EDP aware.

## 2011-06-01 NOTE — ED Notes (Signed)
Pt updated on plan of care, denies any complaints, sitter at bedside,

## 2011-06-01 NOTE — ED Notes (Signed)
Patient sitting in chair in doorway of room with eyes closed.  Respirations even and unlabored; equal rise and fall of chest noted.

## 2011-06-01 NOTE — ED Notes (Signed)
Pt CBG is 156. RN aware.

## 2011-06-01 NOTE — Progress Notes (Signed)
Spoke with pt who reports she is no longer thinking people are out to get her, she is not hearing voices and is not delusional. She reports she has been proud of herself for staying out of the hospital for almost 2 years and realizes she needs to get back on some of the meds [facility] took her off of. Spoke with pt's daughter, Lucia Estelle, [phone] who is willing for pt to come home but [facility]'s act team worker Jeanice Lim needs to get meds to the home. Called [facility] and spoke with the Act[employer] Information systems manager in the absence of [place] and explained about the medications. She responded that it would be taken care of if pt is d/c home. Spoke with Dr Bebe Shaggy regarding the pt's current status and he will order another tele-psych and if the psychiatrist agrees to d/c pt it would be ok for pt to follow up with the Actt Team tomorrow and if not Pt will need to continue to wait for placement at [facility]  Spoke with Blain Pais, [facility] Repeat Admission Coordinator who wanted to know if the Actt Team had tried other alternatives to placement. Gave her number for [facility] [phone] opt 3.

## 2011-06-01 NOTE — ED Notes (Signed)
Pharm called for pt's 70/30 insulin

## 2011-06-01 NOTE — Discharge Instructions (Signed)
Stop lithium.  Take all of your other medications as prescribed. If your condition worsens or if you have any thought of harming herself or others call 911 immediately. Followup with your mental health worker next week   RESOURCE GUIDE  Dental Problems  Patients with Medicaid: [facility] [phone]                                           [phone] [address] Phone:  [phone]                                                  Phone:  [phone]  If unable to pay or uninsured, contact:  [facility] or [facility]. to become qualified for the adult dental clinic.  Chronic Pain Problems Contact [facility]  [phone] Patients need to be referred by their primary care doctor.  Insufficient Money for Medicine Contact [employer]:  call "211" or [facility] 212-781-1886.  No Primary Care Doctor Call [facility]  [phone] Other agencies that provide inexpensive medical care    [facility]  [phone]    [facility]  [phone]    [facility]  [phone]    [facility]  [phone]    [facility]  [phone]    [facility]  [phone]  Psychological Services [facility]  [phone] [facility]  [phone] [facility]   [phone] (emergency services [phone])  Substance Abuse Resources [facility]  [phone] [facility] [phone] [facility] [phone] [name] [phone] Residential & Outpatient Substance Abuse Program  [phone]  Abuse/Neglect [facility] Child Abuse Hotline 7087083545 [facility] Child Abuse Hotline 450-754-7363 (After Hours)  Emergency Shelter [facility] 9143287057  Maternity Homes Room at the [place] [facility] [phone] [facility] [phone]  MRSA Hotline #:    (863)536-3158    [facility] Resources  [facility]     [employer]                          [facility] Dept. [address] [place]                       [address]      [address]  [place]                                                Cristobal Goldmann Phone:  [phone]  Phone:  [phone]                 Phone:  [phone]  [facility] Phone:  [phone]  [facility] Child Abuse Hotline [phone]) 232-4818 [phone] (After Hours)

## 2011-06-01 NOTE — ED Notes (Signed)
Patient evaluated by specialist on-call psychiatry. At 7 PM she is alert cooperative pleasant. [place] Coma Score 15 ambulatory. Adult daughter is here to take her home Can discontinue lithium She is to followup at community mental health. Resume all other medications  Doug Sou, MD 06/01/11 [phone]

## 2011-06-01 NOTE — ED Notes (Signed)
Pt sitting in chair, update given, sitter at bedside,

## 2011-06-01 NOTE — ED Notes (Signed)
Pt lying in bed resting comfortably with eyes closed and equal rise and fall of chest.  nad noted.  Sitter at bedside.

## 2011-06-01 NOTE — ED Notes (Signed)
Pt sitting in chair in doorway, dozing in and out of sleep.  Pt does not want to get in bed.  Pt calm, cooperative.  Sitter at bedside.  nad noted.

## 2011-06-01 NOTE — ED Notes (Signed)
Patient ambulated to bed with steady gait.  Lying on stretcher with eyes closed.  Respirations even and unlabored; equal rise and fall of chest noted.  Sitter remains with patient.

## 2011-06-01 NOTE — ED Provider Notes (Signed)
Pt stable at this time, sitting by the door She has no complaints BP 144/94  Pulse 71  Temp(Src) 97.9 F (36.6 C) (Oral)  Resp 20  Ht 5\' 4"  (1.626 m)  Wt 250 lb (113.399 kg)  BMI 42.91 kg/m2  SpO2 96%  LMP 05/02/2011 Labs reviewed - will recheck lithium level to ensure that it is decreasing Otherwise awaiting placement at this time  Joya Gaskins, MD 06/01/11 [phone]

## 2011-06-01 NOTE — ED Notes (Signed)
Pt eating breakfast. States she wants to take a shower. Pt made aware she will be able to take a shower when she finishes her breakfast

## 2011-06-01 NOTE — ED Notes (Signed)
Patient remains in chair in doorway with eyes closed.

## 2011-06-01 NOTE — ED Notes (Signed)
Spoke with Tommy at [facility] who advised that pt is still on waiting list. Advised Tommy of pt's lithium level and was advised that pt would need to have another level redrawn prior to being sent to [facility]. Samson Frederic (ACT) in department and notified.

## 2011-06-01 NOTE — ED Notes (Signed)
Pt refused nicotine patch for now

## 2011-06-01 NOTE — ED Notes (Signed)
Pt given dinner tray,  

## 2011-06-01 NOTE — ED Provider Notes (Signed)
History     [name]: [id number]  Arrival date & time 05/30/11  9147   First MD Initiated Contact with Patient 05/30/11 1002      Chief Complaint  Patient presents with  . V70.1    (Consider location/radiation/quality/duration/timing/severity/associated sxs/prior treatment) HPI  History reviewed. No pertinent past medical history.  History reviewed. No pertinent past surgical history.  History reviewed. No pertinent family history.  History  Substance Use Topics  . Smoking status: Current Everyday Smoker -- 1.0 packs/day  . Smokeless tobacco: Not on file  . Alcohol Use: No    OB History    Grav Para Term Preterm Abortions TAB SAB Ect Mult Living                  Review of Systems  Allergies  Haldol  Home Medications   Current Outpatient Rx  Name Route Sig Dispense Refill  . BENZTROPINE MESYLATE 0.5 MG PO TABS Oral Take 0.5 mg by mouth 2 (two) times daily. Takes with 1mg  tablet twice daily to equal dose of 1.5mg     . BENZTROPINE MESYLATE 1 MG PO TABS Oral Take 1 mg by mouth 2 (two) times daily. Takes with 0.5mg  tablet to equal 1.5mg  dose twice daily    . CHOLECALCIFEROL 400 UNITS PO TABS Oral Take 800 Units by mouth every morning.    Marland Kitchen HYDROCHLOROTHIAZIDE 25 MG PO TABS Oral Take 25 mg by mouth every morning.    . INSULIN ASPART PROT & ASPART (70-30) 100 UNIT/ML [place] SUSP Subcutaneous Inject 40 Units into the skin 2 (two) times daily with a meal.    . LISINOPRIL 40 MG PO TABS Oral Take 40 mg by mouth daily.    Marland Kitchen LITHIUM CARBONATE 300 MG PO CAPS Oral Take 600-900 mg by mouth 2 (two) times daily. 600mg  every morning (2 capsules) and 900mg  every evening (3 capsules).    . METFORMIN HCL 1000 MG PO TABS Oral Take 1,000 mg by mouth 2 (two) times daily with a meal.    . METOPROLOL TARTRATE 25 MG PO TABS Oral Take 12.5 mg by mouth 2 (two) times daily.    Marland Kitchen OLANZAPINE 10 MG PO TABS Oral Take 10-20 mg by mouth 2 (two) times daily. Takes 1 tablet in the AM and takes 2 tablets at  bedtime    . OMEGA-3-ACID ETHYL ESTERS 1 G PO CAPS Oral Take 1 g by mouth 2 (two) times daily.    Hinda Glatter SUSTENNA IM Intramuscular Inject 117 mg into the muscle every 30 (thirty) days.    Marland Kitchen SIMVASTATIN 20 MG PO TABS Oral Take 20 mg by mouth every evening.      BP 121/70  Pulse 66  Temp(Src) 98.6 F (37 C) (Oral)  Resp 18  Ht 5\' 4"  (1.626 m)  Wt 250 lb (113.399 kg)  BMI 42.91 kg/m2  SpO2 100%  LMP 05/02/2011  Physical Exam  ED Course  Procedures (including critical care time)  Labs Reviewed  CBC - Abnormal; Notable for the following:    WBC 13.1 (*)    All other components within normal limits  DIFFERENTIAL - Abnormal; Notable for the following:    Neutro Abs 9.5 (*)    All other components within normal limits  COMPREHENSIVE METABOLIC PANEL - Abnormal; Notable for the following:    Sodium 132 (*)    Potassium 3.2 (*)    Glucose, Bld 205 (*)    Calcium 10.6 (*)    Total Bilirubin 0.2 (*)  GFR calc non Af Amer 66 (*)    GFR calc Af Amer 76 (*)    All other components within normal limits  GLUCOSE, CAPILLARY - Abnormal; Notable for the following:    Glucose-Capillary 238 (*)    All other components within normal limits  LITHIUM LEVEL - Abnormal; Notable for the following:    Lithium Lvl 2.08 (*)    All other components within normal limits  GLUCOSE, CAPILLARY - Abnormal; Notable for the following:    Glucose-Capillary 163 (*)    All other components within normal limits  GLUCOSE, CAPILLARY - Abnormal; Notable for the following:    Glucose-Capillary 109 (*)    All other components within normal limits  LITHIUM LEVEL - Abnormal; Notable for the following:    Lithium Lvl 1.92 (*)    All other components within normal limits  GLUCOSE, CAPILLARY - Abnormal; Notable for the following:    Glucose-Capillary 168 (*)    All other components within normal limits  LITHIUM LEVEL - Abnormal; Notable for the following:    Lithium Lvl 1.93 (*)    All other components within  normal limits  URINALYSIS, ROUTINE W REFLEX MICROSCOPIC - Abnormal; Notable for the following:    Protein, ur 100 (*)    All other components within normal limits  LITHIUM LEVEL - Abnormal; Notable for the following:    Lithium Lvl 1.85 (*)    All other components within normal limits  URINE MICROSCOPIC-ADD ON - Abnormal; Notable for the following:    Squamous Epithelial / LPF MANY (*)    Bacteria, UA FEW (*)    Casts HYALINE CASTS (*)    All other components within normal limits  GLUCOSE, CAPILLARY - Abnormal; Notable for the following:    Glucose-Capillary 169 (*)    All other components within normal limits  LITHIUM LEVEL - Abnormal; Notable for the following:    Lithium Lvl 1.64 (*)    All other components within normal limits  GLUCOSE, CAPILLARY - Abnormal; Notable for the following:    Glucose-Capillary 156 (*)    All other components within normal limits  GLUCOSE, CAPILLARY - Abnormal; Notable for the following:    Glucose-Capillary 214 (*)    All other components within normal limits  ETHANOL  URINE RAPID DRUG SCREEN (HOSP PERFORMED)  PREGNANCY, URINE  GLUCOSE, CAPILLARY   No results found.   1. Psychosis   2. Hyperglycemia       MDM  Duplicate note        Doug Sou, MD 06/01/11 2345

## 2011-06-01 NOTE — ED Notes (Signed)
Pt daughter here to visit with pt, pt cooperative at present, sitter at bedside.

## 2011-06-01 NOTE — ED Notes (Signed)
Pt has been quiet and cooperative all morning. States she wants to go out and get some fresh air. Waiting for bed placement at a psych facility

## 2011-06-01 NOTE — ED Notes (Addendum)
Pt anxious with waiting in er, pt ambulatory to restroom, ativan given

## 2011-06-01 NOTE — ED Notes (Signed)
meds from pharm at [phone]

## 2011-06-05 ENCOUNTER — Emergency Department (HOSPITAL_COMMUNITY)
Admission: EM | Admit: 2011-06-05 | Discharge: 2011-06-05 | Disposition: A | Discharge status: Home / Self Care | Payer: 59 | Source: Home / Self Care | Attending: Emergency Medicine | Admitting: Emergency Medicine

## 2011-06-05 ENCOUNTER — Encounter (HOSPITAL_COMMUNITY): Payer: Self-pay | Admitting: *Deleted

## 2011-06-05 ENCOUNTER — Ambulatory Visit (HOSPITAL_COMMUNITY): Admission: RE | Admit: 2011-06-05 | Payer: Medicaid Other | Source: Ambulatory Visit

## 2011-06-05 ENCOUNTER — Emergency Department (HOSPITAL_COMMUNITY): Payer: 59

## 2011-06-05 ENCOUNTER — Emergency Department (HOSPITAL_COMMUNITY)
Admission: EM | Admit: 2011-06-05 | Discharge: 2011-06-09 | Disposition: A | Discharge status: Home / Self Care | Payer: 59 | Attending: Emergency Medicine | Admitting: Emergency Medicine

## 2011-06-05 DIAGNOSIS — Z794 Long term (current) use of insulin: Secondary | ICD-10-CM | POA: Insufficient documentation

## 2011-06-05 DIAGNOSIS — H109 Unspecified conjunctivitis: Secondary | ICD-10-CM

## 2011-06-05 DIAGNOSIS — F209 Schizophrenia, unspecified: Secondary | ICD-10-CM | POA: Insufficient documentation

## 2011-06-05 DIAGNOSIS — E7800 Pure hypercholesterolemia, unspecified: Secondary | ICD-10-CM | POA: Insufficient documentation

## 2011-06-05 DIAGNOSIS — F319 Bipolar disorder, unspecified: Secondary | ICD-10-CM | POA: Insufficient documentation

## 2011-06-05 DIAGNOSIS — I10 Essential (primary) hypertension: Secondary | ICD-10-CM | POA: Insufficient documentation

## 2011-06-05 DIAGNOSIS — E119 Type 2 diabetes mellitus without complications: Secondary | ICD-10-CM | POA: Insufficient documentation

## 2011-06-05 DIAGNOSIS — Z79899 Other long term (current) drug therapy: Secondary | ICD-10-CM | POA: Insufficient documentation

## 2011-06-05 HISTORY — DX: Calculus of kidney: N20.0

## 2011-06-05 HISTORY — DX: Pure hypercholesterolemia, unspecified: E78.00

## 2011-06-05 HISTORY — DX: Schizophrenia, unspecified: F20.9

## 2011-06-05 HISTORY — DX: Bipolar disorder, unspecified: F31.9

## 2011-06-05 HISTORY — DX: Essential (primary) hypertension: I10

## 2011-06-05 LAB — COMPREHENSIVE METABOLIC PANEL
ALT: 27 U/L (ref 0–35)
AST: 10 U/L (ref 0–37)
Albumin: 3.8 g/dL (ref 3.5–5.2)
Alkaline Phosphatase: 118 U/L — ABNORMAL HIGH (ref 39–117)
Calcium: 10.5 mg/dL (ref 8.4–10.5)
GFR calc Af Amer: >90 mL/min (ref >90)
Glucose, Bld: 292 mg/dL — ABNORMAL HIGH (ref 70–99)
Potassium: 3.7 mEq/L (ref 3.5–5.1)
Sodium: 135 mEq/L (ref 135–145)
Total Protein: 7.6 g/dL (ref 6.0–8.3)

## 2011-06-05 LAB — CBC
MCH: 29.6 pg (ref 26.0–34.0)
MCHC: 33.1 g/dL (ref 30.0–36.0)
Platelets: 386 10*3/uL (ref 150–400)
RDW: 13 % (ref 11.5–15.5)

## 2011-06-05 LAB — GLUCOSE, CAPILLARY
Glucose-Capillary: 275 mg/dL — ABNORMAL HIGH (ref 70–99)
Glucose-Capillary: 220 mg/dL — ABNORMAL HIGH (ref 70–99)
Glucose-Capillary: 187 mg/dL — ABNORMAL HIGH (ref 70–99)

## 2011-06-05 LAB — RAPID URINE DRUG SCREEN, HOSP PERFORMED
Benzodiazepines: NOT DETECTED
Cocaine: NOT DETECTED
Opiates: NOT DETECTED
Tetrahydrocannabinol: NOT DETECTED

## 2011-06-05 MED ORDER — INSULIN ASPART 100 UNIT/ML ~~LOC~~ SOLN
0.0000 [IU] | SUBCUTANEOUS | Status: DC
  Administered 2011-06-06: 11 [IU] via SUBCUTANEOUS
  Administered 2011-06-06 – 2011-06-07 (×3): 7 [IU] via SUBCUTANEOUS
  Administered 2011-06-07: 11 [IU] via SUBCUTANEOUS
  Administered 2011-06-08 – 2011-06-09 (×3): 4 [IU] via SUBCUTANEOUS
  Administered 2011-06-09: 3 [IU] via SUBCUTANEOUS
  Administered 2011-06-09: 7 [IU] via SUBCUTANEOUS
  Filled 2011-06-05: qty 1

## 2011-06-05 MED ORDER — TETRACAINE HCL 0.5 % OP SOLN
OPHTHALMIC | Status: AC
  Administered 2011-06-05: 06:00:00
  Filled 2011-06-05: qty 2

## 2011-06-05 MED ORDER — LORAZEPAM 1 MG PO TABS
1.0000 mg | ORAL_TABLET | Freq: Three times a day (TID) | ORAL | Status: DC | PRN
  Administered 2011-06-06 (×2): 1 mg via ORAL
  Filled 2011-06-05 (×2): qty 1

## 2011-06-05 MED ORDER — ZOLPIDEM TARTRATE 5 MG PO TABS: 5.0000 mg | ORAL_TABLET | Freq: Every evening | ORAL | Status: DC | PRN

## 2011-06-05 MED ORDER — ONDANSETRON HCL 4 MG PO TABS: 4.0000 mg | ORAL_TABLET | Freq: Three times a day (TID) | ORAL | Status: DC | PRN

## 2011-06-05 MED ORDER — ACETAMINOPHEN 325 MG PO TABS: 650.0000 mg | ORAL_TABLET | ORAL | Status: DC | PRN

## 2011-06-05 MED ORDER — ERYTHROMYCIN 5 MG/GM OP OINT
TOPICAL_OINTMENT | Freq: Once | OPHTHALMIC | Status: AC
  Administered 2011-06-05: 1 via OPHTHALMIC
  Filled 2011-06-05: qty 3.5

## 2011-06-05 MED ORDER — FLUORESCEIN SODIUM 1 MG OP STRP
2.0000 | ORAL_STRIP | Freq: Once | OPHTHALMIC | Status: AC
  Administered 2011-06-05: 2 via OPHTHALMIC

## 2011-06-05 NOTE — ED Notes (Signed)
Pt c/o bilateral eye redness and irritation

## 2011-06-05 NOTE — Discharge Instructions (Signed)
Conjunctivitis Conjunctivitis is commonly called "pink eye." Conjunctivitis can be caused by bacterial or viral infection, allergies, or injuries. There is usually redness of the lining of the eye, itching, discomfort, and sometimes discharge. There may be deposits of matter along the eyelids. A viral infection usually causes a watery discharge, while a bacterial infection causes a yellowish, thick discharge. Pink eye is very contagious and spreads by direct contact. You may be given antibiotic eyedrops as part of your treatment. Before using your eye medicine, remove all drainage from the eye by washing gently with warm water and cotton balls. Continue to use the medication until you have awakened 2 mornings in a row without discharge from the eye. Do not rub your eye. This increases the irritation and helps spread infection. Use separate towels from other household members. Wash your hands with soap and water before and after touching your eyes. Use cold compresses to reduce pain and sunglasses to relieve irritation from light. Do not wear contact lenses or wear eye makeup until the infection is gone. SEEK MEDICAL CARE IF:   Your symptoms are not better after 3 days of treatment.   You have increased pain or trouble seeing.   The outer eyelids become very red or swollen.  Document Released: 03/30/2004 Document Revised: 02/09/2011 Document Reviewed: 02/20/2005 [facility] Patient Information 2012 [place], [place].   Use the erythromycin eye ointment:  1 small strip to each eye four times per day for the next 7 days.  Call your regular medical doctor today to schedule a follow up appointment this week.  Return to the Emergency Department immediately sooner if worsening.

## 2011-06-05 NOTE — ED Provider Notes (Signed)
History     [name]: [id number]  Arrival date & time 06/05/11  9562   First MD Initiated Contact with Patient 06/05/11 [phone]      Chief Complaint  Patient presents with  . Eye Problem     HPI Pt was seen at 0430.  Per pt, c/o gradual onset and persistence of constant bilateral eyes feels "itchy" since last night.  States they are "red" and "tearing."  Has been assoc with cough.  Denies any other complaints.  Denies CP/SOB, no visual changes, no rash, no fevers, no eye pain.    Past Medical History  Diagnosis Date  . Schizophrenia   . Bipolar disorder   . Diabetes mellitus   . Hypercholesteremia   . Hypertension   . Kidney stone     Past Surgical History  Procedure Date  . Cholecystectomy   . Tubal ligation     History  Substance Use Topics  . Smoking status: Current Everyday Smoker -- 1.0 packs/day  . Smokeless tobacco: Not on file  . Alcohol Use: No    Review of Systems ROS: Statement: All systems negative except as marked or noted in the HPI; Constitutional: Negative for fever and chills. ; ; Eyes: Negative for eye pain, +redness, itching, discharge. ; ; ENMT: Negative for ear pain, hoarseness, nasal congestion, sinus pressure and sore throat. ; ; Cardiovascular: Negative for chest pain, palpitations, diaphoresis, dyspnea and peripheral edema. ; ; Respiratory: +cough.  Negative for wheezing and stridor. ; ; Gastrointestinal: Negative for nausea, vomiting, diarrhea, abdominal pain, blood in stool, hematemesis, jaundice and rectal bleeding. . ; ; Genitourinary: Negative for dysuria, flank pain and hematuria. ; ; Musculoskeletal: Negative for back pain and neck pain. Negative for swelling and trauma.; ; Skin: Negative for pruritus, rash, abrasions, blisters, bruising and skin lesion.; ; Neuro: Negative for headache, lightheadedness and neck stiffness. Negative for weakness, altered level of consciousness , altered mental status, extremity weakness, paresthesias, involuntary  movement, seizure and syncope.     Allergies  Haldol  Home Medications   Current Outpatient Rx  Name Route Sig Dispense Refill  . BENZTROPINE MESYLATE 0.5 MG PO TABS Oral Take 0.5 mg by mouth 2 (two) times daily. Takes with 1mg  tablet twice daily to equal dose of 1.5mg     . BENZTROPINE MESYLATE 1 MG PO TABS Oral Take 1 mg by mouth 2 (two) times daily. Takes with 0.5mg  tablet to equal 1.5mg  dose twice daily    . CHOLECALCIFEROL 400 UNITS PO TABS Oral Take 800 Units by mouth every morning.    Marland Kitchen HYDROCHLOROTHIAZIDE 25 MG PO TABS Oral Take 25 mg by mouth every morning.    . INSULIN ASPART PROT & ASPART (70-30) 100 UNIT/ML [place] SUSP Subcutaneous Inject 40 Units into the skin 2 (two) times daily with a meal.    . LISINOPRIL 40 MG PO TABS Oral Take 40 mg by mouth daily.    Marland Kitchen LITHIUM CARBONATE 300 MG PO CAPS Oral Take 600-900 mg by mouth 2 (two) times daily. 600mg  every morning (2 capsules) and 900mg  every evening (3 capsules).    . METFORMIN HCL 1000 MG PO TABS Oral Take 1,000 mg by mouth 2 (two) times daily with a meal.    . METOPROLOL TARTRATE 25 MG PO TABS Oral Take 12.5 mg by mouth 2 (two) times daily.    Marland Kitchen OLANZAPINE 10 MG PO TABS Oral Take 10-20 mg by mouth 2 (two) times daily. Takes 1 tablet in the AM and takes 2  tablets at bedtime    . OMEGA-3-ACID ETHYL ESTERS 1 G PO CAPS Oral Take 1 g by mouth 2 (two) times daily.    Hinda Glatter SUSTENNA IM Intramuscular Inject 117 mg into the muscle every 30 (thirty) days.    Marland Kitchen SIMVASTATIN 20 MG PO TABS Oral Take 20 mg by mouth every evening.      BP 144/78  Pulse 90  Temp 99.1 F (37.3 C)  Resp 20  Wt 245 lb (111.131 kg)  SpO2 99%  LMP 05/02/2011  Physical Exam 0435: Physical examination:  Nursing notes reviewed; Vital signs and O2 SAT reviewed;  Constitutional: Well developed, Well nourished, Well hydrated, In no acute distress; Head:  Normocephalic, atraumatic; Eyes: EOMI, PERRL, No scleral icterus, +mild bilat conjunctival injection with clear  tearing, no periorbital erythema or edema; ENMT: TM's clear bilat.  Mouth and pharynx normal, Mucous membranes moist; Neck: Supple, Full range of motion, No lymphadenopathy; Cardiovascular: Regular rate and rhythm, No murmur, rub, or gallop; Respiratory: Breath sounds clear & equal bilaterally, No rales, rhonchi, wheezes, or rub, Normal respiratory effort/excursion; Chest: Nontender, Movement normal; Abdomen: Soft, Nontender, Nondistended, Normal bowel sounds; Extremities: Pulses normal, No tenderness, No edema, No calf edema or asymmetry.; Neuro: AA&Ox3, Major CN grossly intact.  No gross focal motor or sensory deficits in extremities.; Skin: Color normal, Warm, Dry, no rash.; Psych:  Affect flat, no active psychosis.     ED Course  Procedures    MDM  MDM Reviewed: nursing note, vitals and previous chart     5:20 AM:  Pt refuses CXR for her cough, refuses slit lamp/fluorescein stain for her eyes to check for corneal abrasions.  Denies injury or FB sensation to eyes.  States she "feels ok" and "just wants to go home now."  Will d/c with e-mycin ointment teach and treat.          Laray Anger, DO 06/05/11 1533

## 2011-06-05 NOTE — ED Provider Notes (Signed)
History   This chart was scribed for Hilario Quarry, MD by Brooks Sailors. The patient was seen in room APA06/APA06. Patient's care was started at 1132.   [name]: [id number]  Arrival date & time 06/05/11  1132   First MD Initiated Contact with Patient 06/05/11 1258      Chief Complaint  Patient presents with  . V70.1    (Consider location/radiation/quality/duration/timing/severity/associated sxs/prior treatment) HPI  Emily Cordova is a 41 y.o. female who presents to the Emergency Department BIB [facility] sheriffs department. Patient recently seen at 15:30 today in [facility] ER and discharged for eye pain. Says she went to [employer], which spiked blood sugar. Denies hearing voices. Patient history of schizophrenia, diabetes, HTN, Bipolar disorder.    Past Medical History  Diagnosis Date  . Schizophrenia   . Bipolar disorder   . Diabetes mellitus   . Hypercholesteremia   . Hypertension   . Kidney stone     Past Surgical History  Procedure Date  . Cholecystectomy   . Tubal ligation     No family history on file.  History  Substance Use Topics  . Smoking status: Current Everyday Smoker -- 1.0 packs/day  . Smokeless tobacco: Not on file  . Alcohol Use: No    OB History    Grav Para Term Preterm Abortions TAB SAB Ect Mult Living                  Review of Systems A complete 10 system review of systems was obtained and all systems are negative except as noted in the HPI and PMH.   Allergies  Haldol  Home Medications   Current Outpatient Rx  Name Route Sig Dispense Refill  . BENZTROPINE MESYLATE 0.5 MG PO TABS Oral Take 0.5 mg by mouth 2 (two) times daily. Takes with 1mg  tablet twice daily to equal dose of 1.5mg     . BENZTROPINE MESYLATE 1 MG PO TABS Oral Take 1 mg by mouth 2 (two) times daily. Takes with 0.5mg  tablet to equal 1.5mg  dose twice daily    . CHOLECALCIFEROL 400 UNITS PO TABS Oral Take 800 Units by mouth every morning.    Marland Kitchen HYDROCHLOROTHIAZIDE 25 MG PO  TABS Oral Take 25 mg by mouth every morning.    . INSULIN ASPART PROT & ASPART (70-30) 100 UNIT/ML [place] SUSP Subcutaneous Inject 40 Units into the skin 2 (two) times daily with a meal.    . LISINOPRIL 40 MG PO TABS Oral Take 40 mg by mouth daily.    Marland Kitchen LITHIUM CARBONATE 300 MG PO CAPS Oral Take 600-900 mg by mouth 2 (two) times daily. 600mg  every morning (2 capsules) and 900mg  every evening (3 capsules).    . METFORMIN HCL 1000 MG PO TABS Oral Take 1,000 mg by mouth 2 (two) times daily with a meal.    . METOPROLOL TARTRATE 25 MG PO TABS Oral Take 12.5 mg by mouth 2 (two) times daily.    Marland Kitchen OLANZAPINE 10 MG PO TABS Oral Take 10-20 mg by mouth 2 (two) times daily. Takes 1 tablet in the AM and takes 2 tablets at bedtime    . OMEGA-3-ACID ETHYL ESTERS 1 G PO CAPS Oral Take 1 g by mouth 2 (two) times daily.    Hinda Glatter SUSTENNA IM Intramuscular Inject 117 mg into the muscle every 30 (thirty) days.    Marland Kitchen SIMVASTATIN 20 MG PO TABS Oral Take 20 mg by mouth every evening.  BP 150/72  Pulse 89  Temp(Src) 99 F (37.2 C) (Oral)  Resp 24  Ht 5\' 4"  (1.626 m)  Wt 245 lb (111.131 kg)  BMI 42.05 kg/m2  SpO2 98%  LMP 05/02/2011  Physical Exam  Nursing note and vitals reviewed. Constitutional: She is oriented to person, place, and time. She appears well-developed and well-nourished. No distress.  HENT:  Head: Normocephalic and atraumatic.  Eyes: EOM are normal. Pupils are equal, round, and reactive to light.  Neck: Neck supple. No tracheal deviation present.  Cardiovascular: Normal rate.   Pulmonary/Chest: Effort normal. No respiratory distress.  Abdominal: Soft. She exhibits no distension.  Musculoskeletal: Normal range of motion. She exhibits no edema.  Neurological: She is alert and oriented to person, place, and time. No sensory deficit.  Skin: Skin is warm and dry.  Psychiatric: She has a normal mood and affect. Her behavior is normal.    ED Course  Procedures (including critical care  time) DIAGNOSTIC STUDIES: Oxygen Saturation is 98% on room air, normal by my interpretation.    COORDINATION OF CARE: 1:03PM- Patient informed of current plan for treatment and evaluation and agrees with plan at this time.  1:57 PM- Consultation with Psychiatry on patient.     Labs Reviewed - No data to display No results found.   No diagnosis found.    MDM  Patient is being evaluated byTommy Murphy on-call for at the scene. Patient's commitment papers have been signed here in the emergency department. She has a long history of schizophrenia and we will attempt to find placement      I personally performed the services described in this documentation, which was scribed in my presence. The recorded information has been reviewed and considered.    Hilario Quarry, MD 06/05/11 1520

## 2011-06-05 NOTE — ED Notes (Signed)
Patient ambulatory to desk. States that someone is going to hurt her. States that the sitter is going to hurt her. Told patient that the sitter was not going to hurt her and was there for her safety.

## 2011-06-05 NOTE — BH Assessment (Signed)
Assessment Note   Emily Cordova is an 41 y.o. female. Ms Emily Cordova has again returned to the ED with IVC paperwork. This was completed by Ms Emily Cordova of the ACTT Team  From [facility]. Ms Emily Cordova is paranoid.She thinks her brother  Is trying to kill her. She thinks her twin has died. She believes that someone broke into her home last night and she will  not  Return home. She spent the night at [facility] and refused to leave. She is non compliant with her medications.  Axis I: Chronic Paranoid Schizophrenia Axis II: Deferred Axis III:  Past Medical History  Diagnosis Date  . Schizophrenia   . Bipolar disorder   . Diabetes mellitus   . Hypercholesteremia   . Hypertension   . Cordova stone    Axis IV: other psychosocial or environmental problems, problems related to social environment, problems with access to health care services and problems with primary support group Axis V: 11-20 some danger of hurting self or others possible OR occasionally fails to maintain minimal personal hygiene OR gross impairment in communication  Past Medical History:  Past Medical History  Diagnosis Date  . Schizophrenia   . Bipolar disorder   . Diabetes mellitus   . Hypercholesteremia   . Hypertension   . Cordova stone     Past Surgical History  Procedure Date  . Cholecystectomy   . Tubal ligation     Family History: No family history on file.  Social History:  reports that she has been smoking.  She does not have any smokeless tobacco history on file. She reports that she does not drink alcohol or use illicit drugs.  Additional Social History:    Allergies:  Allergies  Allergen Reactions  . Haldol (Haloperidol Decanoate)     Home Medications:  Medications Prior to Admission  Medication Dose Route Frequency Provider Last Rate Last Dose  . erythromycin ophthalmic ointment   Both Eyes Once Laray Anger, DO   1 application at 06/05/11 0532  . fluorescein ophthalmic strip 2 strip  2  strip Both Eyes Once Laray Anger, DO   2 strip at 06/05/11 0532  . tetracaine (PONTOCAINE) 0.5 % ophthalmic solution            Medications Prior to Admission  Medication Sig Dispense Refill  . benztropine (COGENTIN) 0.5 MG tablet Take 0.5 mg by mouth 2 (two) times daily. Takes with 1mg  tablet twice daily to equal dose of 1.5mg       . benztropine (COGENTIN) 1 MG tablet Take 1 mg by mouth 2 (two) times daily. Takes with 0.5mg  tablet to equal 1.5mg  dose twice daily      . cholecalciferol (VITAMIN D) 400 UNITS TABS Take 800 Units by mouth every morning.      . hydrochlorothiazide (HYDRODIURIL) 25 MG tablet Take 25 mg by mouth every morning.      . insulin aspart protamine-insulin aspart (NOVOLOG 70/30) (70-30) 100 UNIT/ML injection Inject 40-60 Units into the skin 2 (two) times daily with a meal. 60 units in the morning and 40 units in the evening      . lisinopril (PRINIVIL,ZESTRIL) 40 MG tablet Take 40 mg by mouth daily.      . metFORMIN (GLUCOPHAGE) 1000 MG tablet Take 1,000 mg by mouth 2 (two) times daily with a meal.      . metoprolol tartrate (LOPRESSOR) 25 MG tablet Take 12.5 mg by mouth 2 (two) times daily.      Marland Kitchen  OLANZapine (ZYPREXA) 10 MG tablet Take 10-20 mg by mouth 2 (two) times daily. Takes 1 tablet in the AM and takes 2 tablets at bedtime      . omega-3 acid ethyl esters (LOVAZA) 1 G capsule Take 1 g by mouth 2 (two) times daily.      Marland Kitchen lithium carbonate 300 MG capsule Take 600-900 mg by mouth 2 (two) times daily. 600mg  every morning (2 capsules) and 900mg  every evening (3 capsules).      . Paliperidone Palmitate (INVEGA SUSTENNA IM) Inject 117 mg into the muscle every 30 (thirty) days.      . simvastatin (ZOCOR) 20 MG tablet Take 20 mg by mouth every evening.        OB/GYN Status:  Patient's last menstrual period was 05/02/2011.  General Assessment Data Location of Assessment: [facility] ACT Assessment: Yes Living Arrangements: Children Can pt return to current living  arrangement?: Yes Admission Status: Involuntary Is patient capable of signing voluntary admission?: No Transfer from: Acute Hospital Referral Source: MD  Education Status Is patient currently in school?: No  Risk to self Suicidal Ideation: No Suicidal Intent: No Is patient at risk for suicide?: No Suicidal Plan?: No Access to Means: No What has been your use of drugs/alcohol within the last 12 months?: denies Previous Attempts/Gestures: No How many times?: 0  Other Self Harm Risks: no Triggers for Past Attempts: None known Intentional Self Injurious Behavior: None Family Suicide History: No Recent stressful life event(s): Recent negative physical changes Persecutory voices/beliefs?: No Depression: Yes Depression Symptoms: Loss of interest in usual pleasures Substance abuse history and/or treatment for substance abuse?: No Suicide prevention information given to non-admitted patients: Not applicable  Risk to Others Homicidal Ideation: No Thoughts of Harm to Others: No Current Homicidal Intent: No Current Homicidal Plan: No Access to Homicidal Means: No History of harm to others?: Yes Assessment of Violence: In past 6-12 months Violent Behavior Description: combative,non-compliant,cursing Does patient have access to weapons?: No Criminal Charges Pending?: No Does patient have a court date: No  Psychosis Hallucinations: None noted Delusions: Persecutory (thinks brother is trying to kill her;thinks someone broke in)  Mental Status Report Appear/Hygiene: Improved Eye Contact: Fair Motor Activity: Restlessness Speech: Argumentative Level of Consciousness: Alert;Restless;Irritable Mood: Suspicious;Irritable Affect: Blunted Anxiety Level: Minimal Thought Processes: Coherent Judgement: Impaired Orientation: Person;Place;Time Obsessive Compulsive Thoughts/Behaviors: Moderate  Cognitive Functioning Concentration: Decreased Memory: Recent Impaired;Remote  Impaired IQ: Average Insight: Poor Impulse Control: Poor Appetite: Good Sleep: No Change Vegetative Symptoms: None  Prior Inpatient Therapy Prior Inpatient Therapy: Yes Prior Therapy Dates: last asdmission to [facility] for 6 months Prior Therapy Facilty/Provider(s): [facility] Reason for Treatment: psychotic  Prior Outpatient Therapy Prior Outpatient Therapy: Yes Prior Therapy Dates: 2012-2013 Prior Therapy Facilty/Provider(s): [name] Reason for Treatment: medications;ACTT Team                     Additional Information 1:1 In Past 12 Months?: Yes CIRT Risk: Yes Elopement Risk: No Does patient have medical clearance?: Yes     Disposition: Patient referred to [facility], [facility], [facility] and [facility] for consideration.   Disposition Disposition of Patient: Inpatient treatment program Type of inpatient treatment program: Adult  On Site Evaluation by:   Reviewed with Physician:     Jearld Pies 06/05/2011 3:00 PM

## 2011-06-05 NOTE — ED Notes (Addendum)
Pt presents via [facility] sheriffs department. Pt is in cuffs. alert and orientated x 4, calm and cooperative. Pt's daughter states pt is paranoid and a danger to herself.

## 2011-06-05 NOTE — ED Notes (Signed)
Patient refuses xray.

## 2011-06-05 NOTE — Progress Notes (Signed)
Emily Cordova was denies admission by [facility], patient history of behavioral problems, AC; [facility] no beds, [facility], no high acuity beds, [place]; [facility] No beds, [facility]; and [facility], high acuity needs state . Obtained authorization from [facility] for 7 days per [facility]. Information faxed to [facility]. Also called to complete referral, patient placed on wait list . Dr Lucky Rathke is in agreement with disposition.

## 2011-06-05 NOTE — ED Notes (Signed)
Left eye 20/100 Right eye 20/50

## 2011-06-06 LAB — GLUCOSE, CAPILLARY
Glucose-Capillary: 210 mg/dL — ABNORMAL HIGH (ref 70–99)
Glucose-Capillary: 242 mg/dL — ABNORMAL HIGH (ref 70–99)
Glucose-Capillary: 253 mg/dL — ABNORMAL HIGH (ref 70–99)
Glucose-Capillary: 256 mg/dL — ABNORMAL HIGH (ref 70–99)
Glucose-Capillary: 287 mg/dL — ABNORMAL HIGH (ref 70–99)
Glucose-Capillary: 294 mg/dL — ABNORMAL HIGH (ref 70–99)

## 2011-06-06 NOTE — ED Notes (Signed)
Pt tearful, anxious, shaky, states that she is upset about something.  Would not tell RN what she is upset about.  Meds given.

## 2011-06-06 NOTE — ED Notes (Signed)
Pt sitting up in chair, dozing.  Refusing to get into bed.  nad noted.  Sitter at bedside.

## 2011-06-06 NOTE — ED Notes (Signed)
Pt requesting cigarette, wanting to go outside.  No smoking policy explained to pt.  Pt sitting in chair in hallway at this time.  Sitter at bedside.

## 2011-06-06 NOTE — ED Notes (Signed)
Pt anxious, standing in hallway.  Given magazines for pt to look through.  Pt sitting at this time, looking at Magazines.  Sitter at bedside.

## 2011-06-06 NOTE — ED Provider Notes (Signed)
9:44 AM- Dr. Rosalia Hammers to patient bedside at this time. Patient denies auditory hallucinations at this time. Reports she has been able to eat without difficulty. Also states that her blood glucose levels have been measured within 200's recently.   I personally performed the services described in this documentation, which was scribed in my presence. The recorded information has been reviewed and considered.   Hilario Quarry, MD 06/06/11 1022

## 2011-06-06 NOTE — ED Notes (Addendum)
Spoke with Admissions at [facility] who advises that pt is still on waiting list, unsure of time frame. Tommy ACT notified

## 2011-06-06 NOTE — BH Assessment (Signed)
Assessment Note   Emily Cordova is an 41 y.o. female. The patient remains in the ED awaiting inpatient care. She was declined by multiple hospitals as has been the case in the past. She is on the wait list at [facility]. Called placed today verified this.  Patient remains paranoid. Today she is afraid to go to her room for fear some one is going to hurt her. She was awake most of the night,worried that someone was going to hut her.  Axis I: Chronic Paranoid Schizophrenia Axis II: Deferred Axis III:  Past Medical History  Diagnosis Date  . Schizophrenia   . Bipolar disorder   . Diabetes mellitus   . Hypercholesteremia   . Hypertension   . Kidney stone    Axis IV: problems related to legal system/crime, problems with access to health care services and problems with primary support group Axis V: 11-20 some danger of hurting self or others possible OR occasionally fails to maintain minimal personal hygiene OR gross impairment in communication  Past Medical History:  Past Medical History  Diagnosis Date  . Schizophrenia   . Bipolar disorder   . Diabetes mellitus   . Hypercholesteremia   . Hypertension   . Kidney stone     Past Surgical History  Procedure Date  . Cholecystectomy   . Tubal ligation     Family History: No family history on file.  Social History:  reports that she has been smoking.  She does not have any smokeless tobacco history on file. She reports that she does not drink alcohol or use illicit drugs.  Additional Social History:    Allergies:  Allergies  Allergen Reactions  . Haldol (Haloperidol Decanoate)     Home Medications:  Medications Prior to Admission  Medication Dose Route Frequency Provider Last Rate Last Dose  . acetaminophen (TYLENOL) tablet 650 mg  650 mg Oral Q4H PRN Hilario Quarry, MD      . erythromycin ophthalmic ointment   Both Eyes Once Laray Anger, DO   1 application at 06/05/11 0532  . fluorescein ophthalmic strip 2 strip  2  strip Both Eyes Once Laray Anger, DO   2 strip at 06/05/11 0532  . insulin aspart (novoLOG) injection 0-20 Units  0-20 Units Subcutaneous Q4H Hilario Quarry, MD   11 Units at 06/06/11 0747  . LORazepam (ATIVAN) tablet 1 mg  1 mg Oral Q8H PRN Hilario Quarry, MD   1 mg at 06/06/11 0746  . ondansetron (ZOFRAN) tablet 4 mg  4 mg Oral Q8H PRN Hilario Quarry, MD      . tetracaine (PONTOCAINE) 0.5 % ophthalmic solution           . zolpidem (AMBIEN) tablet 5 mg  5 mg Oral QHS PRN Hilario Quarry, MD       Medications Prior to Admission  Medication Sig Dispense Refill  . benztropine (COGENTIN) 0.5 MG tablet Take 0.5 mg by mouth 2 (two) times daily. Takes with 1mg  tablet twice daily to equal dose of 1.5mg       . benztropine (COGENTIN) 1 MG tablet Take 1 mg by mouth 2 (two) times daily. Takes with 0.5mg  tablet to equal 1.5mg  dose twice daily      . cholecalciferol (VITAMIN D) 400 UNITS TABS Take 800 Units by mouth every morning.      . hydrochlorothiazide (HYDRODIURIL) 25 MG tablet Take 25 mg by mouth every morning.      . insulin aspart  protamine-insulin aspart (NOVOLOG 70/30) (70-30) 100 UNIT/ML injection Inject 40-60 Units into the skin 2 (two) times daily with a meal. 60 units in the morning and 40 units in the evening      . lisinopril (PRINIVIL,ZESTRIL) 40 MG tablet Take 40 mg by mouth daily.      Marland Kitchen lithium carbonate 300 MG capsule Take 600-900 mg by mouth 2 (two) times daily. 600mg  every morning (2 capsules) and 900mg  every evening (3 capsules).      . metformin (FORTAMET) 1000 MG (OSM) 24 hr tablet Take 1,000 mg by mouth 2 (two) times daily.      . metoprolol tartrate (LOPRESSOR) 25 MG tablet Take 12.5 mg by mouth 2 (two) times daily.      Marland Kitchen OLANZapine (ZYPREXA) 10 MG tablet Take 10-20 mg by mouth 2 (two) times daily. Takes 1 tablet in the AM and takes 2 tablets at bedtime      . Omega-3 Fatty Acids 500 MG CAPS Take 1 capsule by mouth 2 (two) times daily.      . Paliperidone Palmitate (INVEGA  SUSTENNA IM) Inject 117 mg into the muscle every 30 (thirty) days. Due 06/14/2011      . simvastatin (ZOCOR) 20 MG tablet Take 20 mg by mouth every evening.        OB/GYN Status:  Patient's last menstrual period was 05/02/2011.  General Assessment Data Location of Assessment: [facility] ACT Assessment: Yes Living Arrangements: Children Can pt return to current living arrangement?: Yes Admission Status: Involuntary Is patient capable of signing voluntary admission?: No Transfer from: Acute Hospital Referral Source: MD  Education Status Is patient currently in school?: No  Risk to self Suicidal Ideation: No Suicidal Intent: No Is patient at risk for suicide?: No Suicidal Plan?: No Access to Means: No What has been your use of drugs/alcohol within the last 12 months?: denies Previous Attempts/Gestures: No How many times?: 0  Other Self Harm Risks: no Triggers for Past Attempts: None known Intentional Self Injurious Behavior: None Family Suicide History: No Recent stressful life event(s): Recent negative physical changes Persecutory voices/beliefs?: No Depression: Yes Depression Symptoms: Loss of interest in usual pleasures Substance abuse history and/or treatment for substance abuse?: No Suicide prevention information given to non-admitted patients: Not applicable  Risk to Others Homicidal Ideation: No Thoughts of Harm to Others: No Current Homicidal Intent: No Current Homicidal Plan: No Access to Homicidal Means: No History of harm to others?: Yes Assessment of Violence: In past 6-12 months Violent Behavior Description: cobative;non compliant with treatment;cursing Does patient have access to weapons?: No Criminal Charges Pending?: No Does patient have a court date: No  Psychosis Hallucinations: None noted Delusions: Persecutory (thinks someone broke into home, afraid someone wants to kill)  Mental Status Report Appear/Hygiene: Improved Eye Contact: Fair Motor  Activity: Restlessness;Rigidity Speech: Argumentative Level of Consciousness: Alert;Restless;Irritable Mood: Depressed Affect: Blunted Anxiety Level: Minimal Thought Processes: Circumstantial;Tangential Judgement: Impaired Orientation: Person;Place;Time Obsessive Compulsive Thoughts/Behaviors: Moderate  Cognitive Functioning Concentration: Decreased Memory: Recent Impaired;Remote Impaired IQ: Average Insight: Poor Impulse Control: Poor Appetite: Good Sleep: Decreased Vegetative Symptoms: None  Prior Inpatient Therapy Prior Inpatient Therapy: Yes Prior Therapy Dates: last asdmission to [facility] for 6 months Prior Therapy Facilty/Provider(s): [facility] Reason for Treatment: psychotic  Prior Outpatient Therapy Prior Outpatient Therapy: Yes Prior Therapy Dates: 2012-2013 Prior Therapy Facilty/Provider(s): [name] Reason for Treatment: medications;ACTT Team                  Nutrition Screen Diet:  (  diabetic)  Additional Information 1:1 In Past 12 Months?: Yes CIRT Risk: Yes Elopement Risk: No Does patient have medical clearance?: Yes     Disposition: Patient to remain in the Ed awaiting transfer to [facility]. Dr Rosalia Hammers is in agreement with thsi ploan. Disposition Disposition of Patient: Inpatient treatment program Type of inpatient treatment program: Adult  On Site Evaluation by:   Reviewed with Physician:     Jearld Pies 06/06/2011 3:38 PM

## 2011-06-06 NOTE — ED Notes (Signed)
Pt calm, less anxious.  Sitting in chair.  Sitter at bedside.  Given diet coke per pt request.  nad noted.

## 2011-06-06 NOTE — ED Notes (Signed)
Called into pt room by sitter, pt would "fall:" to sleep while talking to staff, Pt would arouse, speech clear, denies any complaints, BSL 253, VSS, Dr. Rosalia Hammers in room with pt, no further orders given, pt repositioned,

## 2011-06-06 NOTE — ED Notes (Signed)
Pt sitting up in chair, sitter at bedside,

## 2011-06-06 NOTE — ED Notes (Addendum)
Pt states that she feels better but is sore all over, asked pt why she is sore pt states that she does not know. Pt shaking, upset, comfort measures provided. Breakfast tray given to pt, removed 4 cups of soda from pt's room, BSL 287, pt states that she is not sure if she will eat or not, spoke with Dr. Rosalia Hammers who advised to continue with insulin coverage for breakfast.

## 2011-06-06 NOTE — ED Notes (Signed)
Pt sleeping, will arouse, does not wan to eat at present time, sitter at bedside,

## 2011-06-06 NOTE — ED Notes (Signed)
Pt is alert, sitting up in chair, talking with sitter,

## 2011-06-06 NOTE — ED Notes (Signed)
Pt to shower with tech and security. Paper scrubs changed, bed linen changed.

## 2011-06-06 NOTE — ED Notes (Signed)
Pt given crackers,

## 2011-06-07 LAB — GLUCOSE, CAPILLARY: Glucose-Capillary: 295 mg/dL — ABNORMAL HIGH (ref 70–99)

## 2011-06-07 MED ORDER — LISINOPRIL 10 MG PO TABS
40.0000 mg | ORAL_TABLET | Freq: Every day | ORAL | Status: DC
  Administered 2011-06-08 – 2011-06-09 (×2): 40 mg via ORAL
  Filled 2011-06-07 (×4): qty 4

## 2011-06-07 MED ORDER — INSULIN ASPART PROT & ASPART (70-30 MIX) 100 UNIT/ML ~~LOC~~ SUSP
60.0000 [IU] | Freq: Every day | SUBCUTANEOUS | Status: DC
  Administered 2011-06-08 – 2011-06-09 (×2): 60 [IU] via SUBCUTANEOUS
  Filled 2011-06-07: qty 10

## 2011-06-07 MED ORDER — BENZTROPINE MESYLATE 1 MG PO TABS
1.0000 mg | ORAL_TABLET | Freq: Two times a day (BID) | ORAL | Status: DC
  Administered 2011-06-07 – 2011-06-09 (×4): 1 mg via ORAL
  Filled 2011-06-07 (×2): qty 1

## 2011-06-07 MED ORDER — INSULIN ASPART PROT & ASPART (70-30 MIX) 100 UNIT/ML ~~LOC~~ SUSP: 40.0000 [IU] | Freq: Two times a day (BID) | SUBCUTANEOUS | Status: DC

## 2011-06-07 MED ORDER — HYDROCHLOROTHIAZIDE 25 MG PO TABS
25.0000 mg | ORAL_TABLET | Freq: Every morning | ORAL | Status: DC
  Administered 2011-06-07 – 2011-06-09 (×3): 25 mg via ORAL
  Filled 2011-06-07 (×4): qty 1

## 2011-06-07 MED ORDER — METFORMIN HCL ER 500 MG PO TB24
1000.0000 mg | ORAL_TABLET | Freq: Two times a day (BID) | ORAL | Status: DC
  Administered 2011-06-07 – 2011-06-09 (×6): 1000 mg via ORAL
  Filled 2011-06-07 (×7): qty 2

## 2011-06-07 MED ORDER — LITHIUM CARBONATE 300 MG PO CAPS
300.0000 mg | ORAL_CAPSULE | Freq: Two times a day (BID) | ORAL | Status: DC
  Administered 2011-06-07 – 2011-06-09 (×5): 300 mg via ORAL
  Filled 2011-06-07 (×7): qty 1

## 2011-06-07 MED ORDER — SIMVASTATIN 20 MG PO TABS
20.0000 mg | ORAL_TABLET | Freq: Every evening | ORAL | Status: DC
  Administered 2011-06-07 – 2011-06-09 (×2): 20 mg via ORAL
  Filled 2011-06-07 (×3): qty 1

## 2011-06-07 MED ORDER — INSULIN ASPART PROT & ASPART (70-30 MIX) 100 UNIT/ML ~~LOC~~ SUSP
40.0000 [IU] | Freq: Every day | SUBCUTANEOUS | Status: DC
  Administered 2011-06-07: 40 [IU] via SUBCUTANEOUS
  Filled 2011-06-07: qty 10

## 2011-06-07 MED ORDER — INSULIN ASPART 100 UNIT/ML ~~LOC~~ SOLN
SUBCUTANEOUS | Status: AC
  Filled 2011-06-07: qty 1

## 2011-06-07 MED ORDER — OMEGA-3 FATTY ACIDS 500 MG PO CAPS: 1.0000 | ORAL_CAPSULE | Freq: Two times a day (BID) | ORAL | Status: DC

## 2011-06-07 MED ORDER — CHOLECALCIFEROL 10 MCG (400 UNIT) PO TABS
800.0000 [IU] | ORAL_TABLET | Freq: Every morning | ORAL | Status: DC
  Administered 2011-06-07 – 2011-06-09 (×3): 800 [IU] via ORAL
  Filled 2011-06-07 (×4): qty 2

## 2011-06-07 MED ORDER — OMEGA-3-ACID ETHYL ESTERS 1 G PO CAPS
1.0000 g | ORAL_CAPSULE | Freq: Every day | ORAL | Status: DC
  Administered 2011-06-07 – 2011-06-09 (×3): 1 g via ORAL
  Filled 2011-06-07 (×4): qty 1

## 2011-06-07 MED ORDER — OLANZAPINE 5 MG PO TABS
20.0000 mg | ORAL_TABLET | Freq: Every day | ORAL | Status: DC
  Administered 2011-06-07 – 2011-06-09 (×3): 20 mg via ORAL
  Filled 2011-06-07 (×4): qty 4

## 2011-06-07 MED ORDER — METOPROLOL TARTRATE 25 MG PO TABS
12.5000 mg | ORAL_TABLET | Freq: Two times a day (BID) | ORAL | Status: DC
  Administered 2011-06-07 – 2011-06-09 (×4): 12.5 mg via ORAL
  Filled 2011-06-07 (×7): qty 0.5

## 2011-06-07 NOTE — BH Assessment (Signed)
Assessment Note   Emily Cordova is an 41 y.o. female. Ms Geraci continues to decompensate while waiting for admission to [facility]. She is pacing and having increased paranoia. She is refusing at times to go to her room, afraid someone is going to kill her. She is more agitated and irritable. She is spending most of the night pacing the floors.  St times she will refuse treatments.   Axis I: Chronic Paranoid Schizophrenia Axis II: Deferred Axis III:  Past Medical History  Diagnosis Date  . Schizophrenia   . Bipolar disorder   . Diabetes mellitus   . Hypercholesteremia   . Hypertension   . Kidney stone    Axis IV: problems related to legal system/crime, problems related to social environment, problems with access to health care services and problems with primary support group Axis V: 11-20 some danger of hurting self or others possible OR occasionally fails to maintain minimal personal hygiene OR gross impairment in communication  Past Medical History:  Past Medical History  Diagnosis Date  . Schizophrenia   . Bipolar disorder   . Diabetes mellitus   . Hypercholesteremia   . Hypertension   . Kidney stone     Past Surgical History  Procedure Date  . Cholecystectomy   . Tubal ligation     Family History: No family history on file.  Social History:  reports that she has been smoking.  She does not have any smokeless tobacco history on file. She reports that she does not drink alcohol or use illicit drugs.  Additional Social History:    Allergies:  Allergies  Allergen Reactions  . Haldol (Haloperidol Decanoate)     Home Medications:  Medications Prior to Admission  Medication Dose Route Frequency Provider Last Rate Last Dose  . acetaminophen (TYLENOL) tablet 650 mg  650 mg Oral Q4H PRN Hilario Quarry, MD      . benztropine (COGENTIN) tablet 1 mg  1 mg Oral BID Hurman Horn, MD      . cholecalciferol (VITAMIN D) tablet 800 Units  800 Units Oral q morning - 10a Hurman Horn, MD   800 Units at 06/07/11 1141  . erythromycin ophthalmic ointment   Both Eyes Once Laray Anger, DO   1 application at 06/05/11 0532  . fluorescein ophthalmic strip 2 strip  2 strip Both Eyes Once Laray Anger, DO   2 strip at 06/05/11 0532  . hydrochlorothiazide (HYDRODIURIL) tablet 25 mg  25 mg Oral q morning - 10a Hurman Horn, MD   25 mg at 06/07/11 1146  . insulin aspart (novoLOG) injection 0-20 Units  0-20 Units Subcutaneous Q4H Hilario Quarry, MD   11 Units at 06/07/11 [phone]  . insulin aspart protamine-insulin aspart (NOVOLOG 70/30) injection 60 Units  60 Units Subcutaneous Q breakfast Hurman Horn, MD       And  . insulin aspart protamine-insulin aspart (NOVOLOG 70/30) injection 40 Units  40 Units Subcutaneous Q supper Hurman Horn, MD      . lisinopril (PRINIVIL,ZESTRIL) tablet 40 mg  40 mg Oral Daily Hurman Horn, MD      . lithium carbonate capsule 300 mg  300 mg Oral BID WC Hurman Horn, MD   300 mg at 06/07/11 1151  . LORazepam (ATIVAN) tablet 1 mg  1 mg Oral Q8H PRN Hilario Quarry, MD   1 mg at 06/06/11 1901  . metFORMIN (GLUCOPHAGE-XR) 24 hr tablet 1,000 mg  1,000 mg Oral BID Hurman Horn, MD   1,000 mg at 06/07/11 1135  . metoprolol tartrate (LOPRESSOR) tablet 12.5 mg  12.5 mg Oral BID Hurman Horn, MD      . OLANZapine [facility]) tablet 20 mg  20 mg Oral Daily Hurman Horn, MD   20 mg at 06/07/11 1138  . omega-3 acid ethyl esters (LOVAZA) capsule 1 g  1 g Oral Daily Hurman Horn, MD   1 g at 06/07/11 1143  . ondansetron (ZOFRAN) tablet 4 mg  4 mg Oral Q8H PRN Hilario Quarry, MD      . simvastatin (ZOCOR) tablet 20 mg  20 mg Oral QPM Hurman Horn, MD      . tetracaine (PONTOCAINE) 0.5 % ophthalmic solution           . zolpidem (AMBIEN) tablet 5 mg  5 mg Oral QHS PRN Hilario Quarry, MD      . DISCONTD: insulin aspart protamine-insulin aspart (NOVOLOG 70/30) injection 40-60 Units  40-60 Units Subcutaneous BID WC Hurman Horn, MD      . DISCONTD:  Omega-3 Fatty Acids CAPS 500 mg  1 capsule Oral BID Hurman Horn, MD       Medications Prior to Admission  Medication Sig Dispense Refill  . benztropine (COGENTIN) 0.5 MG tablet Take 0.5 mg by mouth 2 (two) times daily. Takes with 1mg  tablet twice daily to equal dose of 1.5mg       . benztropine (COGENTIN) 1 MG tablet Take 1 mg by mouth 2 (two) times daily. Takes with 0.5mg  tablet to equal 1.5mg  dose twice daily      . cholecalciferol (VITAMIN D) 400 UNITS TABS Take 800 Units by mouth every morning.      . hydrochlorothiazide (HYDRODIURIL) 25 MG tablet Take 25 mg by mouth every morning.      . insulin aspart protamine-insulin aspart (NOVOLOG 70/30) (70-30) 100 UNIT/ML injection Inject 40-60 Units into the skin 2 (two) times daily with a meal. 60 units in the morning and 40 units in the evening      . lisinopril (PRINIVIL,ZESTRIL) 40 MG tablet Take 40 mg by mouth daily.      Marland Kitchen lithium carbonate 300 MG capsule Take 600-900 mg by mouth 2 (two) times daily. 600mg  every morning (2 capsules) and 900mg  every evening (3 capsules).      . metformin (FORTAMET) 1000 MG (OSM) 24 hr tablet Take 1,000 mg by mouth 2 (two) times daily.      . metoprolol tartrate (LOPRESSOR) 25 MG tablet Take 12.5 mg by mouth 2 (two) times daily.      Marland Kitchen OLANZapine (ZYPREXA) 10 MG tablet Take 10-20 mg by mouth 2 (two) times daily. Takes 1 tablet in the AM and takes 2 tablets at bedtime      . Omega-3 Fatty Acids 500 MG CAPS Take 1 capsule by mouth 2 (two) times daily.      . Paliperidone Palmitate (INVEGA SUSTENNA IM) Inject 117 mg into the muscle every 30 (thirty) days. Due 06/14/2011      . simvastatin (ZOCOR) 20 MG tablet Take 20 mg by mouth every evening.        OB/GYN Status:  Patient's last menstrual period was 05/02/2011.  General Assessment Data Location of Assessment: [facility] ACT Assessment: Yes Living Arrangements: Children Can pt return to current living arrangement?: Yes Admission Status: Involuntary Is patient  capable of signing voluntary admission?: No Transfer from: Acute Hospital Referral  Source: MD  Education Status Is patient currently in school?: No  Risk to self Suicidal Ideation: No Suicidal Intent: No Is patient at risk for suicide?: No Suicidal Plan?: No Access to Means: No What has been your use of drugs/alcohol within the last 12 months?: denies Previous Attempts/Gestures: No How many times?: 0  Other Self Harm Risks: no Triggers for Past Attempts: None known Intentional Self Injurious Behavior: None Family Suicide History: No Recent stressful life event(s): Recent negative physical changes;Loss (Comment) (husband died 2-3 months ago) Persecutory voices/beliefs?: Yes Depression: Yes Depression Symptoms: Insomnia;Tearfulness Substance abuse history and/or treatment for substance abuse?: No Suicide prevention information given to non-admitted patients: Not applicable  Risk to Others Homicidal Ideation: No Thoughts of Harm to Others: No Current Homicidal Intent: No Current Homicidal Plan: No Access to Homicidal Means: No History of harm to others?: Yes Assessment of Violence: In past 6-12 months Violent Behavior Description: combative;noncomplaint Does patient have access to weapons?: No Criminal Charges Pending?: No Does patient have a court date: No  Psychosis Hallucinations: None noted Delusions: Persecutory (thinks people are trying to kill her)  Mental Status Report Appear/Hygiene: Improved Eye Contact: Fair Motor Activity: Restlessness (pacing) Speech: Soft Level of Consciousness: Alert;Restless;Irritable Mood: Depressed;Suspicious Affect: Blunted Anxiety Level: Moderate Thought Processes: Circumstantial Judgement: Impaired Orientation: Person;Place;Time Obsessive Compulsive Thoughts/Behaviors: Moderate  Cognitive Functioning Concentration: Decreased Memory: Recent Impaired;Remote Impaired IQ: Average Insight: Poor Impulse Control:  Poor Appetite: Good Sleep: Decreased Vegetative Symptoms: None  Prior Inpatient Therapy Prior Inpatient Therapy: Yes Prior Therapy Dates: last asdmission to [facility] for 6 months Prior Therapy Facilty/Provider(s): [facility] Reason for Treatment: psychotic  Prior Outpatient Therapy Prior Outpatient Therapy: Yes Prior Therapy Dates: 2012-2013 Prior Therapy Facilty/Provider(s): [name] Reason for Treatment: medications;ACTT Team            Values / Beliefs Cultural Requests During Hospitalization: None Spiritual Requests During Hospitalization: None     Nutrition Screen Diet: Low cholesterol  Additional Information 1:1 In Past 12 Months?: Yes CIRT Risk: Yes Elopement Risk: No Does patient have medical clearance?: Yes     Disposition: Called [facility], speaking with admissions staff. Ms Cob remains on the wait list. There has been some discharges, but she has not been reached at this point. Dr Fonnie Jarvis made aware of the status of the patient. Disposition Disposition of Patient: Inpatient treatment program Type of inpatient treatment program: Adult  On Site Evaluation by:   Reviewed with Physician:     Jearld Pies 06/07/2011 4:47 PM

## 2011-06-07 NOTE — ED Notes (Signed)
Pt ate all of her breakfast.  Pt requesting to call her daughter but does not know the #.  Informed pt that I would see if I could find it out for her.  Pt sitting quietly in room.  nad noted

## 2011-06-07 NOTE — ED Notes (Signed)
[phone] Daughter Emily Cordova [phone]

## 2011-06-07 NOTE — ED Notes (Signed)
Called and Faxed information for Telepsych consult.

## 2011-06-07 NOTE — ED Provider Notes (Signed)
Patient is on a IVC for schizophrenia patient was brought in by sheriff's department. Patient has been seen by the behavioral health counselor and is awaiting placement. Patient also has a history of diabetes blood sugars have been running in the low 200s she is on a diabetic protocol.  We'll have to contact behavioral health team later this morning to get an update on her status for placement.  What brought the patient in was that the patient's daughter stated that she is paranoid and then danger to herself.    Shelda Jakes, MD 06/07/11 [phone]

## 2011-06-07 NOTE — ED Provider Notes (Signed)
This chart was scribed for Dr. Fonnie Jarvis by Clarita Crane. The patient was seen in room APA16A/APA16A.    8:00AM- Telepsych consult ordered by Dr. Wayland Salinas.   TelePsych agrees with and recs Lithium 300mg  po bid and Zyprexa 20mg  po daily for now.[id number]  I personally performed the services described in this documentation, which was scribed in my presence. The recorded information has been reviewed and considered.  Pt napping this afternoon, has been calm and cooperative today in ED, [facility] aware of Pt but no beds available today per ACT who called [facility] again.[id number]  Hurman Horn, MD 06/07/11 2240

## 2011-06-07 NOTE — ED Notes (Signed)
Dinner tray given to pt

## 2011-06-08 LAB — GLUCOSE, CAPILLARY: Glucose-Capillary: 165 mg/dL — ABNORMAL HIGH (ref 70–99)

## 2011-06-08 MED ORDER — METOPROLOL TARTRATE 50 MG PO TABS
ORAL_TABLET | ORAL | Status: AC
  Filled 2011-06-08: qty 1

## 2011-06-08 MED ORDER — METFORMIN HCL 500 MG PO TABS
ORAL_TABLET | ORAL | Status: AC
  Filled 2011-06-08: qty 2

## 2011-06-08 MED ORDER — BENZTROPINE MESYLATE 1 MG PO TABS
ORAL_TABLET | ORAL | Status: AC
  Filled 2011-06-08: qty 1

## 2011-06-08 NOTE — ED Notes (Signed)
Pt's daughter came and brought her some chinese food.  nad noted

## 2011-06-08 NOTE — ED Notes (Signed)
Pt is sleeping

## 2011-06-08 NOTE — ED Notes (Signed)
Patient awake and cooperative this morning, but guarded. Patient provided breakfast tray and eating at present. Denies pain at this time. Denies any other needs. Will continue to monitor and sitter at bedside.

## 2011-06-08 NOTE — BH Assessment (Signed)
Assessment Note   Emily Cordova is an 41 y.o. female. Emily Cordova remains in the ED awaiting transfer tod [facility]. She remains delusional, thinking people are going to kill her or hurt her. She remains fearful of her room at times. She is pacing more and her mood continues to go up and down.  Some medications have been ordered after a tele-psych wa completed.  Axis I: Chronic Paranoid Schizophrenia Axis II: Deferred Axis III:  Past Medical History  Diagnosis Date  . Schizophrenia   . Bipolar disorder   . Diabetes mellitus   . Hypercholesteremia   . Hypertension   . Kidney stone    Axis IV: problems related to social environment and problems with access to health care services Axis V: 11-20 some danger of hurting self or others possible OR occasionally fails to maintain minimal personal hygiene OR gross impairment in communication  Past Medical History:  Past Medical History  Diagnosis Date  . Schizophrenia   . Bipolar disorder   . Diabetes mellitus   . Hypercholesteremia   . Hypertension   . Kidney stone     Past Surgical History  Procedure Date  . Cholecystectomy   . Tubal ligation     Family History: No family history on file.  Social History:  reports that she has been smoking.  She does not have any smokeless tobacco history on file. She reports that she does not drink alcohol or use illicit drugs.  Additional Social History:    Allergies:  Allergies  Allergen Reactions  . Haldol (Haloperidol Decanoate)     Home Medications:  Medications Prior to Admission  Medication Dose Route Frequency Provider Last Rate Last Dose  . acetaminophen (TYLENOL) tablet 650 mg  650 mg Oral Q4H PRN Hilario Quarry, MD      . benztropine (COGENTIN) tablet 1 mg  1 mg Oral BID Hurman Horn, MD   1 mg at 06/08/11 1003  . cholecalciferol (VITAMIN D) tablet 800 Units  800 Units Oral q morning - 10a Hurman Horn, MD   800 Units at 06/08/11 1002  . erythromycin ophthalmic ointment   Both  Eyes Once Laray Anger, DO   1 application at 06/05/11 0532  . fluorescein ophthalmic strip 2 strip  2 strip Both Eyes Once Laray Anger, DO   2 strip at 06/05/11 0532  . hydrochlorothiazide (HYDRODIURIL) tablet 25 mg  25 mg Oral q morning - 10a Hurman Horn, MD   25 mg at 06/08/11 1003  . insulin aspart (novoLOG) 100 UNIT/ML injection           . insulin aspart (novoLOG) injection 0-20 Units  0-20 Units Subcutaneous Q4H Hilario Quarry, MD   4 Units at 06/08/11 0746  . insulin aspart protamine-insulin aspart (NOVOLOG 70/30) injection 60 Units  60 Units Subcutaneous Q breakfast Hurman Horn, MD   60 Units at 06/08/11 0744   And  . insulin aspart protamine-insulin aspart (NOVOLOG 70/30) injection 40 Units  40 Units Subcutaneous Q supper Hurman Horn, MD   40 Units at 06/07/11 1711  . lisinopril (PRINIVIL,ZESTRIL) tablet 40 mg  40 mg Oral Daily Hurman Horn, MD   40 mg at 06/08/11 1004  . lithium carbonate capsule 300 mg  300 mg Oral BID WC Hurman Horn, MD   300 mg at 06/08/11 0744  . LORazepam (ATIVAN) tablet 1 mg  1 mg Oral Q8H PRN Hilario Quarry, MD  1 mg at 06/06/11 1901  . metFORMIN (GLUCOPHAGE-XR) 24 hr tablet 1,000 mg  1,000 mg Oral BID Hurman Horn, MD   1,000 mg at 06/08/11 1001  . metoprolol tartrate (LOPRESSOR) tablet 12.5 mg  12.5 mg Oral BID Hurman Horn, MD   12.5 mg at 06/08/11 1005  . OLANZapine (ZYPREXA) tablet 20 mg  20 mg Oral Daily Hurman Horn, MD   20 mg at 06/08/11 0959  . omega-3 acid ethyl esters (LOVAZA) capsule 1 g  1 g Oral Daily Hurman Horn, MD   1 g at 06/08/11 1002  . ondansetron (ZOFRAN) tablet 4 mg  4 mg Oral Q8H PRN Hilario Quarry, MD      . simvastatin (ZOCOR) tablet 20 mg  20 mg Oral QPM Hurman Horn, MD   20 mg at 06/07/11 1713  . tetracaine (PONTOCAINE) 0.5 % ophthalmic solution           . zolpidem (AMBIEN) tablet 5 mg  5 mg Oral QHS PRN Hilario Quarry, MD      . DISCONTD: insulin aspart protamine-insulin aspart (NOVOLOG 70/30) injection  40-60 Units  40-60 Units Subcutaneous BID WC Hurman Horn, MD      . DISCONTD: Omega-3 Fatty Acids CAPS 500 mg  1 capsule Oral BID Hurman Horn, MD       Medications Prior to Admission  Medication Sig Dispense Refill  . benztropine (COGENTIN) 0.5 MG tablet Take 0.5 mg by mouth 2 (two) times daily. Takes with 1mg  tablet twice daily to equal dose of 1.5mg       . benztropine (COGENTIN) 1 MG tablet Take 1 mg by mouth 2 (two) times daily. Takes with 0.5mg  tablet to equal 1.5mg  dose twice daily      . cholecalciferol (VITAMIN D) 400 UNITS TABS Take 800 Units by mouth every morning.      . hydrochlorothiazide (HYDRODIURIL) 25 MG tablet Take 25 mg by mouth every morning.      . insulin aspart protamine-insulin aspart (NOVOLOG 70/30) (70-30) 100 UNIT/ML injection Inject 40-60 Units into the skin 2 (two) times daily with a meal. 60 units in the morning and 40 units in the evening      . lisinopril (PRINIVIL,ZESTRIL) 40 MG tablet Take 40 mg by mouth daily.      Marland Kitchen lithium carbonate 300 MG capsule Take 600-900 mg by mouth 2 (two) times daily. 600mg  every morning (2 capsules) and 900mg  every evening (3 capsules).      . metformin (FORTAMET) 1000 MG (OSM) 24 hr tablet Take 1,000 mg by mouth 2 (two) times daily.      . metoprolol tartrate (LOPRESSOR) 25 MG tablet Take 12.5 mg by mouth 2 (two) times daily.      Marland Kitchen OLANZapine (ZYPREXA) 10 MG tablet Take 10-20 mg by mouth 2 (two) times daily. Takes 1 tablet in the AM and takes 2 tablets at bedtime      . Omega-3 Fatty Acids 500 MG CAPS Take 1 capsule by mouth 2 (two) times daily.      . Paliperidone Palmitate (INVEGA SUSTENNA IM) Inject 117 mg into the muscle every 30 (thirty) days. Due 06/14/2011      . simvastatin (ZOCOR) 20 MG tablet Take 20 mg by mouth every evening.        OB/GYN Status:  Patient's last menstrual period was 05/02/2011.  General Assessment Data Location of Assessment: [facility] ACT Assessment: Yes Living Arrangements: Children Can pt return  to current living arrangement?: Yes Admission Status: Involuntary Is patient capable of signing voluntary admission?: No Transfer from: Acute Hospital Referral Source: MD  Education Status Is patient currently in school?: No  Risk to self Suicidal Ideation: No Suicidal Intent: No Is patient at risk for suicide?: No Suicidal Plan?: No Access to Means: No What has been your use of drugs/alcohol within the last 12 months?: denies Previous Attempts/Gestures: No How many times?: 0  Other Self Harm Risks: no Triggers for Past Attempts: None known Intentional Self Injurious Behavior: None Family Suicide History: No Recent stressful life event(s): Recent negative physical changes Persecutory voices/beliefs?: Yes Depression: Yes Depression Symptoms: Insomnia;Tearfulness Substance abuse history and/or treatment for substance abuse?: No Suicide prevention information given to non-admitted patients: Not applicable  Risk to Others Homicidal Ideation: No Thoughts of Harm to Others: No Current Homicidal Intent: No Current Homicidal Plan: No Access to Homicidal Means: No History of harm to others?: Yes Assessment of Violence: In past 6-12 months Violent Behavior Description: combative Does patient have access to weapons?: No Criminal Charges Pending?: No Does patient have a court date: No  Psychosis Hallucinations: None noted Delusions: Persecutory  Mental Status Report Appear/Hygiene: Improved Eye Contact: Fair Motor Activity: Restlessness (pacing) Speech: Soft Level of Consciousness: Alert;Restless;Irritable Mood: Depressed;Suspicious Affect: Blunted Anxiety Level: Moderate Thought Processes: Circumstantial Judgement: Impaired Orientation: Person;Place;Time Obsessive Compulsive Thoughts/Behaviors: Moderate  Cognitive Functioning Concentration: Decreased Memory: Recent Impaired;Remote Impaired IQ: Average Insight: Poor Impulse Control: Poor Appetite: Good Sleep:  Decreased Vegetative Symptoms: None  Prior Inpatient Therapy Prior Inpatient Therapy: Yes Prior Therapy Dates: [facility] asdmission to [facility] for 6 months Prior Therapy Facilty/Provider(s): [facility] Reason for Treatment: psychotic  Prior Outpatient Therapy Prior Outpatient Therapy: Yes Prior Therapy Dates: 2012-2013 Prior Therapy Facilty/Provider(s): [name] Reason for Treatment: medications;ACTT Team            Values / Beliefs Cultural Requests During Hospitalization: None Spiritual Requests During Hospitalization: None     Nutrition Screen Diet: Low cholesterol  Additional Information 1:1 In Past 12 Months?: Yes CIRT Risk: Yes Elopement Risk: No Does patient have medical clearance?: Yes     Disposition: Contacted [facility] this am, spoke with Junious Dresser in admissions. No bed are available at this time, patient remains on wait list.  Disposition Disposition of Patient: Inpatient treatment program Type of inpatient treatment program: Adult  On Site Evaluation by:   Reviewed with Physician:     Jearld Pies 06/08/2011 11:10 AM

## 2011-06-09 LAB — GLUCOSE, CAPILLARY
Glucose-Capillary: 128 mg/dL — ABNORMAL HIGH (ref 70–99)
Glucose-Capillary: 247 mg/dL — ABNORMAL HIGH (ref 70–99)
Glucose-Capillary: 161 mg/dL — ABNORMAL HIGH (ref 70–99)

## 2011-06-09 MED ORDER — INSULIN ASPART 100 UNIT/ML ~~LOC~~ SOLN
SUBCUTANEOUS | Status: AC
  Filled 2011-06-09: qty 1

## 2011-06-09 NOTE — ED Notes (Signed)
Pt asked to take a shower. Making arrangements.

## 2011-06-09 NOTE — ED Notes (Signed)
Telespych completed. Pt awaiting discharge.

## 2011-06-09 NOTE — Discharge Instructions (Signed)
Make sure to followup with [facility].

## 2011-06-09 NOTE — ED Provider Notes (Signed)
Psychiatry consultation with Dr. Henderson Cloud is appreciated in case is discussed with Dr. Henderson Cloud. Patient is calm and oriented and in no need of acute inpatient psychiatric care at this time. Involuntary commitment is rescinded and she is discharged to followup at [facility].  Dione Booze, MD 06/09/11 2016

## 2011-06-09 NOTE — ED Notes (Signed)
Pt alert and cooperative.  Denies needs at present time. No distress noted. Awaiting Telepsych consult.

## 2011-06-09 NOTE — ED Notes (Signed)
ACT team member to come evaluate pt to determine if pt is at baseline.

## 2011-06-09 NOTE — BH Assessment (Signed)
Assessment Note   Emily Cordova is an 41 y.o. female. Emily Cordova continues to wait in the ED for admission to [facility]. She remains paranoid, thinking people are trying to kill her. She is restless.She has been cooperative for the most part. She is alert and oriented to place and time. Her family remains supportive. She has improved  with the intervention of medications prescribe earlier by tele-psych. Spoke with Junious Dresser in admissions at [facility], they are still on delay, no time frame given for a possible admission.   Axis I: Chronic Paranoid Schizophrenia Axis II: Deferred Axis III:  Past Medical History  Diagnosis Date  . Schizophrenia   . Bipolar disorder   . Diabetes mellitus   . Hypercholesteremia   . Hypertension   . Kidney stone    Axis IV: problems related to social environment and problems with primary support group Axis V: 21-30 behavior considerably influenced by delusions or hallucinations OR serious impairment in judgment, communication OR inability to function in almost all areas  Past Medical History:  Past Medical History  Diagnosis Date  . Schizophrenia   . Bipolar disorder   . Diabetes mellitus   . Hypercholesteremia   . Hypertension   . Kidney stone     Past Surgical History  Procedure Date  . Cholecystectomy   . Tubal ligation     Family History: No family history on file.  Social History:  reports that she has been smoking.  She does not have any smokeless tobacco history on file. She reports that she does not drink alcohol or use illicit drugs.  Additional Social History:    Allergies:  Allergies  Allergen Reactions  . Haldol (Haloperidol Decanoate)     Home Medications:  Medications Prior to Admission  Medication Dose Route Frequency Provider Last Rate Last Dose  . acetaminophen (TYLENOL) tablet 650 mg  650 mg Oral Q4H PRN Hilario Quarry, MD      . benztropine (COGENTIN) tablet 1 mg  1 mg Oral BID Hurman Horn, MD   1 mg at 06/08/11 1003  .  cholecalciferol (VITAMIN D) tablet 800 Units  800 Units Oral q morning - 10a Hurman Horn, MD   800 Units at 06/08/11 1002  . erythromycin ophthalmic ointment   Both Eyes Once Laray Anger, DO   1 application at 06/05/11 0532  . fluorescein ophthalmic strip 2 strip  2 strip Both Eyes Once Laray Anger, DO   2 strip at 06/05/11 0532  . hydrochlorothiazide (HYDRODIURIL) tablet 25 mg  25 mg Oral q morning - 10a Hurman Horn, MD   25 mg at 06/08/11 1003  . insulin aspart (novoLOG) 100 UNIT/ML injection           . insulin aspart (novoLOG) injection 0-20 Units  0-20 Units Subcutaneous Q4H Hilario Quarry, MD   3 Units at 06/09/11 [phone]  . insulin aspart protamine-insulin aspart (NOVOLOG 70/30) injection 60 Units  60 Units Subcutaneous Q breakfast Hurman Horn, MD   60 Units at 06/08/11 0744   And  . insulin aspart protamine-insulin aspart (NOVOLOG 70/30) injection 40 Units  40 Units Subcutaneous Q supper Hurman Horn, MD   40 Units at 06/07/11 1711  . lisinopril (PRINIVIL,ZESTRIL) tablet 40 mg  40 mg Oral Daily Hurman Horn, MD   40 mg at 06/08/11 1004  . lithium carbonate capsule 300 mg  300 mg Oral BID WC Hurman Horn, MD   300 mg  at 06/08/11 0744  . LORazepam (ATIVAN) tablet 1 mg  1 mg Oral Q8H PRN Hilario Quarry, MD   1 mg at 06/06/11 1901  . metFORMIN (GLUCOPHAGE-XR) 24 hr tablet 1,000 mg  1,000 mg Oral BID Hurman Horn, MD   1,000 mg at 06/08/11 2326  . metoprolol tartrate (LOPRESSOR) tablet 12.5 mg  12.5 mg Oral BID Hurman Horn, MD   12.5 mg at 06/08/11 1005  . OLANZapine (ZYPREXA) tablet 20 mg  20 mg Oral Daily Hurman Horn, MD   20 mg at 06/08/11 0959  . omega-3 acid ethyl esters (LOVAZA) capsule 1 g  1 g Oral Daily Hurman Horn, MD   1 g at 06/08/11 1002  . ondansetron (ZOFRAN) tablet 4 mg  4 mg Oral Q8H PRN Hilario Quarry, MD      . simvastatin (ZOCOR) tablet 20 mg  20 mg Oral QPM Hurman Horn, MD   20 mg at 06/07/11 1713  . tetracaine (PONTOCAINE) 0.5 % ophthalmic  solution           . zolpidem (AMBIEN) tablet 5 mg  5 mg Oral QHS PRN Hilario Quarry, MD      . DISCONTD: benztropine (COGENTIN) 1 MG tablet           . DISCONTD: insulin aspart protamine-insulin aspart (NOVOLOG 70/30) injection 40-60 Units  40-60 Units Subcutaneous BID WC Hurman Horn, MD      . DISCONTD: metFORMIN (GLUCOPHAGE) 500 MG tablet           . DISCONTD: metoprolol (LOPRESSOR) 50 MG tablet           . DISCONTD: Omega-3 Fatty Acids CAPS 500 mg  1 capsule Oral BID Hurman Horn, MD       Medications Prior to Admission  Medication Sig Dispense Refill  . benztropine (COGENTIN) 0.5 MG tablet Take 0.5 mg by mouth 2 (two) times daily. Takes with 1mg  tablet twice daily to equal dose of 1.5mg       . benztropine (COGENTIN) 1 MG tablet Take 1 mg by mouth 2 (two) times daily. Takes with 0.5mg  tablet to equal 1.5mg  dose twice daily      . cholecalciferol (VITAMIN D) 400 UNITS TABS Take 800 Units by mouth every morning.      . hydrochlorothiazide (HYDRODIURIL) 25 MG tablet Take 25 mg by mouth every morning.      . insulin aspart protamine-insulin aspart (NOVOLOG 70/30) (70-30) 100 UNIT/ML injection Inject 40-60 Units into the skin 2 (two) times daily with a meal. 60 units in the morning and 40 units in the evening      . lisinopril (PRINIVIL,ZESTRIL) 40 MG tablet Take 40 mg by mouth daily.      Marland Kitchen lithium carbonate 300 MG capsule Take 600-900 mg by mouth 2 (two) times daily. 600mg  every morning (2 capsules) and 900mg  every evening (3 capsules).      . metformin (FORTAMET) 1000 MG (OSM) 24 hr tablet Take 1,000 mg by mouth 2 (two) times daily.      . metoprolol tartrate (LOPRESSOR) 25 MG tablet Take 12.5 mg by mouth 2 (two) times daily.      Marland Kitchen OLANZapine (ZYPREXA) 10 MG tablet Take 10-20 mg by mouth 2 (two) times daily. Takes 1 tablet in the AM and takes 2 tablets at bedtime      . Omega-3 Fatty Acids 500 MG CAPS Take 1 capsule by mouth 2 (two) times daily.      Marland Kitchen  Paliperidone Palmitate (INVEGA  SUSTENNA IM) Inject 117 mg into the muscle every 30 (thirty) days. Due 06/14/2011      . simvastatin (ZOCOR) 20 MG tablet Take 20 mg by mouth every evening.        OB/GYN Status:  Patient's last menstrual period was 05/02/2011.  General Assessment Data Location of Assessment: [facility] ACT Assessment: Yes Living Arrangements: Children Can pt return to current living arrangement?: Yes Admission Status: Involuntary Is patient capable of signing voluntary admission?: No Transfer from: Acute Hospital Referral Source: MD  Education Status Is patient currently in school?: No  Risk to self Suicidal Ideation: No Suicidal Intent: No Is patient at risk for suicide?: No Suicidal Plan?: No Access to Means: No What has been your use of drugs/alcohol within the last 12 months?: denies Previous Attempts/Gestures: No How many times?: 0  Other Self Harm Risks: no Triggers for Past Attempts: None known Intentional Self Injurious Behavior: None Family Suicide History: No Recent stressful life event(s): Recent negative physical changes Persecutory voices/beliefs?: No Depression: Yes Depression Symptoms: Insomnia;Isolating;Guilt Substance abuse history and/or treatment for substance abuse?: No Suicide prevention information given to non-admitted patients: Not applicable  Risk to Others Homicidal Ideation: No Thoughts of Harm to Others: No Current Homicidal Intent: No Current Homicidal Plan: No Access to Homicidal Means: No History of harm to others?: Yes Assessment of Violence: In past 6-12 months Violent Behavior Description: combative Does patient have access to weapons?: No Criminal Charges Pending?: No Does patient have a court date: No  Psychosis Hallucinations: None noted Delusions: Persecutory  Mental Status Report Appear/Hygiene: Improved Eye Contact: Fair Motor Activity: Freedom of movement Speech: Soft Level of Consciousness: Alert Mood: Depressed Affect:  Blunted Anxiety Level: Minimal Thought Processes: Circumstantial Judgement: Impaired Orientation: Person;Place;Time Obsessive Compulsive Thoughts/Behaviors: Minimal  Cognitive Functioning Concentration: Decreased Memory: Recent Impaired;Remote Impaired IQ: Average Insight: Poor Impulse Control: Poor Appetite: Good Sleep: Increased Total Hours of Sleep: 6  Vegetative Symptoms: None  Prior Inpatient Therapy Prior Inpatient Therapy: Yes Prior Therapy Dates: last asdmission to [facility] for 6 months Prior Therapy Facilty/Provider(s): [facility] Reason for Treatment: psychotic  Prior Outpatient Therapy Prior Outpatient Therapy: Yes Prior Therapy Dates: 2012-2013 Prior Therapy Facilty/Provider(s): [name] Reason for Treatment: medications;ACTT Team            Values / Beliefs Cultural Requests During Hospitalization: None Spiritual Requests During Hospitalization: None     Nutrition Screen Diet: Low cholesterol  Additional Information 1:1 In Past 12 Months?: Yes CIRT Risk: No Elopement Risk: No Does patient have medical clearance?: Yes     Disposition: Patient to remain in the ED awaiting bed availability at [facility]. ACTT  Team from [name] will come to assess the patient as to her baseline and service needs they may be able to meet. Dr Estell Harpin is in agreement with this disposition.  Disposition Disposition of Patient: Inpatient treatment program Type of inpatient treatment program: Adult  On Site Evaluation by:   Reviewed with Physician:     Jearld Pies 06/09/2011 9:59 AM

## 2011-06-09 NOTE — ED Notes (Signed)
Pt has family present at bedside. Pt remains calm and cooperative.

## 2011-06-09 NOTE — ED Notes (Signed)
Pt reports she did find transportation home.  Discharged to waiting area to await ride.

## 2011-06-09 NOTE — ED Notes (Signed)
Katie with ACT team at bedside to speak with pt.

## 2011-06-11 ENCOUNTER — Encounter (HOSPITAL_COMMUNITY): Payer: Self-pay

## 2011-06-11 ENCOUNTER — Emergency Department (HOSPITAL_COMMUNITY)
Admission: EM | Admit: 2011-06-11 | Discharge: 2011-06-21 | Disposition: A | Discharge status: Other Acute Inpatient Hospital | Payer: Medicaid Other | Attending: Emergency Medicine | Admitting: Emergency Medicine

## 2011-06-11 DIAGNOSIS — F29 Unspecified psychosis not due to a substance or known physiological condition: Secondary | ICD-10-CM | POA: Insufficient documentation

## 2011-06-11 DIAGNOSIS — Z8659 Personal history of other mental and behavioral disorders: Secondary | ICD-10-CM | POA: Insufficient documentation

## 2011-06-11 DIAGNOSIS — Z046 Encounter for general psychiatric examination, requested by authority: Secondary | ICD-10-CM | POA: Insufficient documentation

## 2011-06-11 DIAGNOSIS — Z79899 Other long term (current) drug therapy: Secondary | ICD-10-CM | POA: Insufficient documentation

## 2011-06-11 DIAGNOSIS — F319 Bipolar disorder, unspecified: Secondary | ICD-10-CM | POA: Insufficient documentation

## 2011-06-11 DIAGNOSIS — E7800 Pure hypercholesterolemia, unspecified: Secondary | ICD-10-CM | POA: Insufficient documentation

## 2011-06-11 DIAGNOSIS — I10 Essential (primary) hypertension: Secondary | ICD-10-CM | POA: Insufficient documentation

## 2011-06-11 DIAGNOSIS — E119 Type 2 diabetes mellitus without complications: Secondary | ICD-10-CM | POA: Insufficient documentation

## 2011-06-11 LAB — DIFFERENTIAL
Basophils Absolute: 0 10*3/uL (ref 0.0–0.1)
Basophils Relative: 0 % (ref 0–1)
Eosinophils Absolute: 0.4 10*3/uL (ref 0.0–0.7)
Eosinophils Relative: 3 % (ref 0–5)
Lymphocytes Relative: 22 % (ref 12–46)
Monocytes Absolute: 0.5 10*3/uL (ref 0.1–1.0)

## 2011-06-11 LAB — BASIC METABOLIC PANEL
BUN: 6 mg/dL (ref 6–23)
CO2: 23 mEq/L (ref 19–32)
Calcium: 10.2 mg/dL (ref 8.4–10.5)
Chloride: 102 mEq/L (ref 96–112)
Creatinine, Ser: 0.49 mg/dL — ABNORMAL LOW (ref 0.50–1.10)

## 2011-06-11 LAB — CBC
HCT: 37.6 % (ref 36.0–46.0)
MCHC: 33.8 g/dL (ref 30.0–36.0)
MCV: 89.1 fL (ref 78.0–100.0)
Platelets: 421 10*3/uL — ABNORMAL HIGH (ref 150–400)
RDW: 12.9 % (ref 11.5–15.5)
WBC: 12.7 10*3/uL — ABNORMAL HIGH (ref 4.0–10.5)

## 2011-06-11 LAB — ETHANOL: Alcohol, Ethyl (B): <11 mg/dL (ref 0–11)

## 2011-06-11 NOTE — Progress Notes (Addendum)
Ms Nilsen can not be placed in a psych bed at this time as no beds are available or she was declined for acuity. Patient has been referred to [facility]. Attempted to complete [facility] authorization, but Florentina Addison could not complete. Someone will call with the authorization  number in the morning. State paper work completed except for this. Called [facility] and explained that referral would be faxed in the morning.
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
## 2011-06-12 LAB — GLUCOSE, CAPILLARY
Glucose-Capillary: 266 mg/dL — ABNORMAL HIGH (ref 70–99)
Glucose-Capillary: 197 mg/dL — ABNORMAL HIGH (ref 70–99)

## 2011-06-12 LAB — URINALYSIS, ROUTINE W REFLEX MICROSCOPIC
Glucose, UA: 250 mg/dL — AB
Ketones, ur: NEGATIVE mg/dL
Leukocytes, UA: NEGATIVE
Nitrite: NEGATIVE
Specific Gravity, Urine: 1.03 (ref 1.005–1.030)
pH: 6 (ref 5.0–8.0)

## 2011-06-12 LAB — URINE MICROSCOPIC-ADD ON

## 2011-06-12 LAB — RAPID URINE DRUG SCREEN, HOSP PERFORMED
Amphetamines: NOT DETECTED
Barbiturates: NOT DETECTED
Benzodiazepines: NOT DETECTED
Cocaine: NOT DETECTED
Opiates: NOT DETECTED
Tetrahydrocannabinol: NOT DETECTED

## 2011-06-12 MED ORDER — OLANZAPINE 5 MG PO TABS
20.0000 mg | ORAL_TABLET | Freq: Every day | ORAL | Status: DC
  Administered 2011-06-13: 20 mg via ORAL
  Administered 2011-06-13: 10 mg via ORAL
  Administered 2011-06-15 – 2011-06-20 (×6): 20 mg via ORAL
  Filled 2011-06-12: qty 2
  Filled 2011-06-12 (×2): qty 4
  Filled 2011-06-12: qty 2

## 2011-06-12 MED ORDER — INSULIN ASPART PROT & ASPART (70-30 MIX) 100 UNIT/ML ~~LOC~~ SUSP
SUBCUTANEOUS | Status: AC
  Filled 2011-06-12: qty 10

## 2011-06-12 MED ORDER — INSULIN ASPART PROT & ASPART (70-30 MIX) 100 UNIT/ML ~~LOC~~ SUSP
60.0000 [IU] | Freq: Every day | SUBCUTANEOUS | Status: DC
  Administered 2011-06-13 – 2011-06-21 (×4): 60 [IU] via SUBCUTANEOUS
  Filled 2011-06-12: qty 10

## 2011-06-12 MED ORDER — LISINOPRIL 10 MG PO TABS
40.0000 mg | ORAL_TABLET | Freq: Every day | ORAL | Status: DC
  Administered 2011-06-13 – 2011-06-21 (×8): 40 mg via ORAL
  Filled 2011-06-12 (×4): qty 4

## 2011-06-12 MED ORDER — ZIPRASIDONE MESYLATE 20 MG IM SOLR
INTRAMUSCULAR | Status: AC
  Administered 2011-06-12: 20 mg via INTRAMUSCULAR
  Filled 2011-06-12: qty 20

## 2011-06-12 MED ORDER — INSULIN ASPART PROT & ASPART (70-30 MIX) 100 UNIT/ML ~~LOC~~ SUSP
40.0000 [IU] | Freq: Every day | SUBCUTANEOUS | Status: DC
  Administered 2011-06-12 – 2011-06-20 (×5): 40 [IU] via SUBCUTANEOUS
  Filled 2011-06-12 (×2): qty 10
  Filled 2011-06-12: qty 3

## 2011-06-12 MED ORDER — METOPROLOL TARTRATE 12.5 MG HALF TABLET
12.5000 mg | ORAL_TABLET | Freq: Two times a day (BID) | ORAL | Status: DC
  Administered 2011-06-13 – 2011-06-21 (×12): 12.5 mg via ORAL
  Filled 2011-06-12 (×4): qty 0.5

## 2011-06-12 MED ORDER — METFORMIN HCL ER 500 MG PO TB24
1000.0000 mg | ORAL_TABLET | Freq: Two times a day (BID) | ORAL | Status: DC
  Administered 2011-06-13 – 2011-06-21 (×15): 1000 mg via ORAL
  Filled 2011-06-12 (×6): qty 2

## 2011-06-12 MED ORDER — HYDROCHLOROTHIAZIDE 25 MG PO TABS
25.0000 mg | ORAL_TABLET | Freq: Every morning | ORAL | Status: DC
  Administered 2011-06-13 – 2011-06-21 (×8): 25 mg via ORAL
  Filled 2011-06-12 (×4): qty 1

## 2011-06-12 MED ORDER — BENZTROPINE MESYLATE 1 MG PO TABS
0.5000 mg | ORAL_TABLET | Freq: Two times a day (BID) | ORAL | Status: DC
  Administered 2011-06-13 – 2011-06-21 (×15): 0.5 mg via ORAL
  Filled 2011-06-12 (×4): qty 1

## 2011-06-12 MED ORDER — BENZTROPINE MESYLATE 1 MG PO TABS
1.0000 mg | ORAL_TABLET | Freq: Two times a day (BID) | ORAL | Status: DC
  Administered 2011-06-13 – 2011-06-21 (×15): 1 mg via ORAL
  Filled 2011-06-12 (×2): qty 1

## 2011-06-12 MED ORDER — OLANZAPINE 5 MG PO TABS
10.0000 mg | ORAL_TABLET | Freq: Every day | ORAL | Status: DC
  Administered 2011-06-14 – 2011-06-21 (×7): 10 mg via ORAL
  Filled 2011-06-12 (×2): qty 1
  Filled 2011-06-12: qty 2

## 2011-06-12 MED ORDER — LITHIUM CARBONATE 300 MG PO CAPS
600.0000 mg | ORAL_CAPSULE | Freq: Every day | ORAL | Status: DC
  Administered 2011-06-13 – 2011-06-21 (×8): 600 mg via ORAL
  Filled 2011-06-12 (×3): qty 2

## 2011-06-12 MED ORDER — ZIPRASIDONE MESYLATE 20 MG IM SOLR
20.0000 mg | Freq: Once | INTRAMUSCULAR | Status: AC
  Administered 2011-06-12: 20 mg via INTRAMUSCULAR

## 2011-06-12 MED ORDER — SIMVASTATIN 20 MG PO TABS
20.0000 mg | ORAL_TABLET | Freq: Every day | ORAL | Status: DC
  Administered 2011-06-13 – 2011-06-20 (×8): 20 mg via ORAL
  Filled 2011-06-12 (×3): qty 1

## 2011-06-12 MED ORDER — PALIPERIDONE PALMITATE 117 MG/0.75ML IM SUSP
117.0000 mg | Freq: Once | INTRAMUSCULAR | Status: DC
  Filled 2011-06-12: qty 0.75

## 2011-06-12 MED ORDER — LITHIUM CARBONATE 300 MG PO CAPS
900.0000 mg | ORAL_CAPSULE | Freq: Every day | ORAL | Status: DC
  Administered 2011-06-13 – 2011-06-20 (×7): 900 mg via ORAL
  Filled 2011-06-12 (×4): qty 3

## 2011-06-12 NOTE — ED Notes (Signed)
Pt sitting in chair, anxious in appearance but answers questions appropriately. In NAD, calm and cooperative at this time. Pt has no requests at this time. Awaiting placement on wait list at [facility].

## 2011-06-12 NOTE — ED Notes (Signed)
Attempted to get patient to give ua specimen. She was not able to void . Nuns caps placed in commode. Patient flushed commode before ua could be obtained

## 2011-06-12 NOTE — ED Notes (Signed)
Patient given diet ginger ale to drink. 

## 2011-06-12 NOTE — ED Notes (Signed)
[facility] and stated that Emily Cordova was on their waiting list April 1st and was inadvertently  discharged  home from [facility].  [facility] was not notified of this discharge and now Emily Greb  will have to start at the bottom of the list for admission.
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
## 2011-06-12 NOTE — ED Notes (Signed)
Pt is much more calm at this time, sitting in room quietly.
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
## 2011-06-12 NOTE — ED Notes (Signed)
Sitter remains at bedside, pt safety maintained

## 2011-06-12 NOTE — ED Notes (Signed)
Patient resting quietly at this time . No distress noted. Still unable to obtain ua spaecimen

## 2011-06-12 NOTE — ED Notes (Signed)
MD at bedside. 

## 2011-06-12 NOTE — ED Notes (Signed)
Pt unable to provide urine, gave 2 cups of water and encouraged to provide sample.

## 2011-06-13 LAB — GLUCOSE, CAPILLARY

## 2011-06-13 NOTE — ED Notes (Signed)
Sitter reports pt has recently started going to sink and drinking large ammmts of water, pt observed going to bathroom multiple times, staff to check cbg

## 2011-06-13 NOTE — ED Notes (Signed)
Pharmacy aware of pt meds.

## 2011-06-13 NOTE — ED Notes (Signed)
Assisted pt to bed at this time . Pt states she is sleepy also v/s taken at this time . Pt also given ice water at this time. Misty Stanley

## 2011-06-13 NOTE — ED Notes (Signed)
Awaiting all other scheduled meds from pharmacy

## 2011-06-13 NOTE — BH Assessment (Signed)
Assessment Note   Emily Cordova is an 41 y.o. female.Emily Cordova remains  In the ED awaiting placement at [facility]. She is on the wait list which is on delay. Spoke with admissions staff this morning and there had been no change in the status. Emily Cordova remains withdrawn. She is very quite and spends time in her room looking out the door. She frequently asking when she is going home. She remains delusional, asking about her children's well being. She still fears that they have been harmed. She has remained cooperative.   Axis I: Chronic Paranoid Schizophrenia Axis II: Deferred Axis III:  Past Medical History  Diagnosis Date  . Schizophrenia   . Bipolar disorder   . Diabetes mellitus   . Hypercholesteremia   . Hypertension   . Kidney stone    Axis IV: problems with access to health care services Axis V: 11-20 some danger of hurting self or others possible OR occasionally fails to maintain minimal personal hygiene OR gross impairment in communication  Past Medical History:  Past Medical History  Diagnosis Date  . Schizophrenia   . Bipolar disorder   . Diabetes mellitus   . Hypercholesteremia   . Hypertension   . Kidney stone     Past Surgical History  Procedure Date  . Cholecystectomy   . Tubal ligation     Family History: No family history on file.  Social History:  reports that she has been smoking.  She does not have any smokeless tobacco history on file. She reports that she does not drink alcohol or use illicit drugs.  Additional Social History:    Allergies:  Allergies  Allergen Reactions  . Haldol (Haloperidol Decanoate)     Home Medications:  Medications Prior to Admission  Medication Dose Route Frequency Provider Last Rate Last Dose  . benztropine (COGENTIN) tablet 0.5 mg  0.5 mg Oral BID [name], MD   0.5 mg at 06/13/11 1610  . benztropine (COGENTIN) tablet 1 mg  1 mg Oral BID [name], MD   1 mg at 06/13/11 0839  . hydrochlorothiazide (HYDRODIURIL) tablet  25 mg  25 mg Oral q morning - 10a [name], MD   25 mg at 06/13/11 1002  . insulin aspart protamine-insulin aspart (NOVOLOG 70/30) injection 40 Units  40 Units Subcutaneous Q supper [name], MD   40 Units at 06/12/11 1827  . insulin aspart protamine-insulin aspart (NOVOLOG 70/30) injection 60 Units  60 Units Subcutaneous Q breakfast [name], MD   60 Units at 06/13/11 0841  . lisinopril (PRINIVIL,ZESTRIL) tablet 40 mg  40 mg Oral Daily [name], MD   40 mg at 06/13/11 1003  . lithium carbonate capsule 600 mg  600 mg Oral Q breakfast [name], MD   600 mg at 06/13/11 0840  . lithium carbonate capsule 900 mg  900 mg Oral QHS [name], MD      . metFORMIN (GLUCOPHAGE-XR) 24 hr tablet 1,000 mg  1,000 mg Oral BID WC [name], MD   1,000 mg at 06/13/11 1002  . metoprolol tartrate (LOPRESSOR) tablet 12.5 mg  12.5 mg Oral BID [name], MD   12.5 mg at 06/13/11 1001  . OLANZapine (ZYPREXA) tablet 10 mg  10 mg Oral Daily [name], MD      . OLANZapine (ZYPREXA) tablet 20 mg  20 mg Oral QHS [name], MD   10 mg at  06/13/11 0840  . Paliperidone Palmitate SUSP 117 mg  117 mg Intramuscular Once [name], MD      . simvastatin (ZOCOR) tablet 20 mg  20 mg Oral q1800 [name], MD      . ziprasidone (GEODON) injection 20 mg  20 mg Intramuscular Once [name], MD   20 mg at 06/12/11 1812  . DISCONTD: acetaminophen (TYLENOL) tablet 650 mg  650 mg Oral Q4H PRN Hilario Quarry, MD      . DISCONTD: benztropine (COGENTIN) tablet 1 mg  1 mg Oral BID Hurman Horn, MD   1 mg at 06/09/11 2102  . DISCONTD: cholecalciferol (VITAMIN D) tablet 800 Units  800 Units Oral q morning - 10a Hurman Horn, MD   800 Units at 06/09/11 1035  . DISCONTD: hydrochlorothiazide (HYDRODIURIL) tablet 25 mg  25 mg Oral q morning - 10a Hurman Horn, MD   25 mg at 06/09/11 1041  . DISCONTD: insulin aspart (novoLOG) injection 0-20 Units  0-20 Units Subcutaneous Q4H Hilario Quarry, MD   4 Units at 06/09/11 1756    . DISCONTD: insulin aspart protamine-insulin aspart (NOVOLOG 70/30) injection 40 Units  40 Units Subcutaneous Q supper Hurman Horn, MD   40 Units at 06/07/11 1711  . DISCONTD: insulin aspart protamine-insulin aspart (NOVOLOG 70/30) injection 60 Units  60 Units Subcutaneous Q breakfast Hurman Horn, MD   60 Units at 06/09/11 1034  . DISCONTD: lisinopril (PRINIVIL,ZESTRIL) tablet 40 mg  40 mg Oral Daily Hurman Horn, MD   40 mg at 06/09/11 1040  . DISCONTD: lithium carbonate capsule 300 mg  300 mg Oral BID WC Hurman Horn, MD   300 mg at 06/09/11 2101  . DISCONTD: LORazepam (ATIVAN) tablet 1 mg  1 mg Oral Q8H PRN Hilario Quarry, MD   1 mg at 06/06/11 1901  . DISCONTD: metFORMIN (GLUCOPHAGE-XR) 24 hr tablet 1,000 mg  1,000 mg Oral BID Hurman Horn, MD   1,000 mg at 06/09/11 2102  . DISCONTD: metoprolol tartrate (LOPRESSOR) tablet 12.5 mg  12.5 mg Oral BID Hurman Horn, MD   12.5 mg at 06/09/11 2102  . DISCONTD: OLANZapine (ZYPREXA) tablet 20 mg  20 mg Oral Daily Hurman Horn, MD   20 mg at 06/09/11 1036  . DISCONTD: omega-3 acid ethyl esters (LOVAZA) capsule 1 g  1 g Oral Daily Hurman Horn, MD   1 g at 06/09/11 1035  . DISCONTD: ondansetron (ZOFRAN) tablet 4 mg  4 mg Oral Q8H PRN Hilario Quarry, MD      . DISCONTD: simvastatin (ZOCOR) tablet 20 mg  20 mg Oral QPM Hurman Horn, MD   20 mg at 06/09/11 2102  . DISCONTD: zolpidem (AMBIEN) tablet 5 mg  5 mg Oral QHS PRN Hilario Quarry, MD       Medications Prior to Admission  Medication Sig Dispense Refill  . benztropine (COGENTIN) 0.5 MG tablet Take 0.5 mg by mouth 2 (two) times daily. Takes with 1mg  tablet twice daily to equal dose of 1.5mg       . benztropine (COGENTIN) 1 MG tablet Take 1 mg by mouth 2 (two) times daily. Takes with 0.5mg  tablet to equal 1.5mg  dose twice daily      . cholecalciferol (VITAMIN D) 400 UNITS TABS Take 800 Units by mouth every morning.      . hydrochlorothiazide (HYDRODIURIL) 25 MG tablet Take 25 mg by mouth every  morning.      . insulin aspart protamine-insulin aspart (NOVOLOG 70/30) (70-30) 100 UNIT/ML injection Inject 40-60 Units into the skin 2 (two) times daily with a meal. 60 units in the morning and 40 units in the evening      . lisinopril (PRINIVIL,ZESTRIL) 40 MG tablet Take 40 mg by mouth daily.      Marland Kitchen lithium carbonate 300 MG capsule Take 600-900 mg by mouth 2 (two) times daily. 600mg  every morning (2 capsules) and 900mg  every evening (3 capsules).      . metformin (FORTAMET) 1000 MG (OSM) 24 hr tablet Take 1,000 mg by mouth 2 (two) times daily.      . metoprolol tartrate (LOPRESSOR) 25 MG tablet Take 12.5 mg by mouth 2 (two) times daily.      Marland Kitchen OLANZapine (ZYPREXA) 10 MG tablet Take 10-20 mg by mouth 2 (two) times daily. Takes 1 tablet in the AM and takes 2 tablets at bedtime      . Omega-3 Fatty Acids 500 MG CAPS Take 1 capsule by mouth 2 (two) times daily.      . Paliperidone Palmitate (INVEGA SUSTENNA IM) Inject 117 mg into the muscle every 30 (thirty) days. Due 06/14/2011      . simvastatin (ZOCOR) 20 MG tablet Take 20 mg by mouth every evening.        OB/GYN Status:  Patient's last menstrual period was 05/02/2011.  General Assessment Data Location of Assessment: [facility] ED ACT Assessment: Yes Living Arrangements: Children Can pt return to current living arrangement?: Yes Admission Status: Involuntary Is patient capable of signing voluntary admission?: No Transfer from: Acute Hospital Referral Source: MD  Education Status Is patient currently in school?: No  Risk to self Suicidal Ideation: No Suicidal Intent: No Is patient at risk for suicide?: No Suicidal Plan?: No Access to Means: No What has been your use of drugs/alcohol within the last 12 months?: denies Previous Attempts/Gestures: No Other Self Harm Risks: none Triggers for Past Attempts: None known Intentional Self Injurious Behavior: None Family Suicide History: No Recent stressful life event(s): Recent negative  physical changes Persecutory voices/beliefs?: No Depression: Yes Depression Symptoms: Insomnia;Loss of interest in usual pleasures Substance abuse history and/or treatment for substance abuse?: No Suicide prevention information given to non-admitted patients: Not applicable  Risk to Others Homicidal Ideation: No Thoughts of Harm to Others: No Comment - Thoughts of Harm to Others: in recent past Current Homicidal Intent: No Current Homicidal Plan: No Access to Homicidal Means: No Identified Victim: unknown- History of harm to others?: Yes Assessment of Violence: In distant past Violent Behavior Description: combative Does patient have access to weapons?: No Criminal Charges Pending?: No Does patient have a court date: No  Psychosis Hallucinations: None noted Delusions: Persecutory (thinks people want to kill her or have killed family members)  Mental Status Report Appear/Hygiene: Improved Eye Contact: Fair Motor Activity: Freedom of movement;Restlessness;Shuffling Speech: Soft;Slow Level of Consciousness: Alert Mood: Depressed Affect: Blunted;Preoccupied Anxiety Level: None Thought Processes: Circumstantial Judgement: Impaired Orientation: Person;Place;Time Obsessive Compulsive Thoughts/Behaviors: Minimal  Cognitive Functioning Concentration: Decreased Memory: Recent Impaired;Remote Impaired IQ: Average Insight: Poor Impulse Control: Poor Appetite: Good Sleep: No Change Total Hours of Sleep: 4  Vegetative Symptoms: None  Prior Inpatient Therapy Prior Inpatient Therapy: Yes Prior Therapy Dates: last asdmission to [facility] for 6 months Prior Therapy Facilty/Provider(s): [facility] Reason for Treatment: psychotic  Prior Outpatient Therapy Prior Outpatient Therapy: Yes Prior Therapy Dates: 2012-2013 Prior [facility] Facilty[facility]Provider([facility]): [name] Reason for Treatment: medications;ACTT  Team            Values / Beliefs Cultural Requests During Hospitalization:  None Spiritual Requests During Hospitalization: None        Additional Information 1:1 In Past 12 Months?: Yes CIRT Risk: No Elopement Risk: No Does patient have medical clearance?: Yes     Disposition:patient to remain in the ED under IVC until she can be transferred to [facility]. Dr Ignacia Palma is in agreement with this disposition.  Disposition Disposition of Patient: Inpatient treatment program Type of inpatient treatment program: Adult  On Site Evaluation by:   Reviewed with Physician:     Jearld Pies 06/13/2011 3:09 PM

## 2011-06-13 NOTE — ED Notes (Signed)
CBG Result: [phone]

## 2011-06-13 NOTE — ED Notes (Signed)
Gave patient cup of water and diet ginger ale as requested.

## 2011-06-13 NOTE — ED Notes (Signed)
Lunch tray provided. 

## 2011-06-14 LAB — GLUCOSE, CAPILLARY
Glucose-Capillary: 205 mg/dL — ABNORMAL HIGH (ref 70–99)
Glucose-Capillary: 91 mg/dL (ref 70–99)
Glucose-Capillary: 74 mg/dL (ref 70–99)

## 2011-06-14 NOTE — Progress Notes (Signed)
0600 Patient slept little last night. No behavioral issues. Remains psychotic. Patient has a h/o of disruptive, violent behavior. Awaiting placement at [facility] at the bedside.

## 2011-06-14 NOTE — ED Notes (Signed)
Pt resting comfortably.  Sitter at bedside.  nad noted.

## 2011-06-14 NOTE — BH Assessment (Signed)
Assessment Note   [name] is an 41 y.o. female. Emily Cordova continues to wait for bed availability at [facility]. She was in the ED last week from Monday through Friday. She was released at that time. She was returned to the ED by [employer] after repeated calls . At the time of the last 2 calls she was still in the [employer] parking lot with a Engineer, water. She was delusional thinking her family was being harmed. She has continued with similar thought regarding her family while in the ED. She wants to go home  to help them. She still appears to be responding to internal stimuli. She remains unsafe to return to the community.   Axis I: Chronic Paranoid Schizophrenia Axis II: Deferred Axis III:  Past Medical History  Diagnosis Date  . Schizophrenia   . Bipolar disorder   . Diabetes mellitus   . Hypercholesteremia   . Hypertension   . Kidney stone    Axis IV: other psychosocial or environmental problems and problems with access to health care services Axis V: 11-20 some danger of hurting self or others possible OR occasionally fails to maintain minimal personal hygiene OR gross impairment in communication  Past Medical History:  Past Medical History  Diagnosis Date  . Schizophrenia   . Bipolar disorder   . Diabetes mellitus   . Hypercholesteremia   . Hypertension   . Kidney stone     Past Surgical History  Procedure Date  . Cholecystectomy   . Tubal ligation     Family History: No family history on file.  Social History:  reports that she has been smoking.  She does not have any smokeless tobacco history on file. She reports that she does not drink alcohol or use illicit drugs.  Additional Social History:    Allergies:  Allergies  Allergen Reactions  . Haldol (Haloperidol Decanoate)     Home Medications:  Medications Prior to Admission  Medication Dose Route Frequency Provider Last Rate Last Dose  . benztropine (COGENTIN) tablet 0.5 mg  0.5 mg Oral BID [name], MD   0.5  mg at 06/14/11 0936  . benztropine (COGENTIN) tablet 1 mg  1 mg Oral BID [name], MD   1 mg at 06/14/11 0936  . hydrochlorothiazide (HYDRODIURIL) tablet 25 mg  25 mg Oral q morning - 10a [name], MD   25 mg at 06/14/11 0940  . insulin aspart protamine-insulin aspart (NOVOLOG 70/30) injection 40 Units  40 Units Subcutaneous Q supper [name], MD   40 Units at 06/13/11 1824  . insulin aspart protamine-insulin aspart (NOVOLOG 70/30) injection 60 Units  60 Units Subcutaneous Q breakfast [name], MD   60 Units at 06/14/11 0941  . lisinopril (PRINIVIL,ZESTRIL) tablet 40 mg  40 mg Oral Daily [name], MD   40 mg at 06/14/11 0939  . lithium carbonate capsule 600 mg  600 mg Oral Q breakfast [name], MD   600 mg at 06/14/11 0936  . lithium carbonate capsule 900 mg  900 mg Oral QHS [name], MD   900 mg at 06/13/11 2240  . metFORMIN (GLUCOPHAGE-XR) 24 hr tablet 1,000 mg  1,000 mg Oral BID WC [name], MD   1,000 mg at 06/14/11 0939  . metoprolol tartrate (LOPRESSOR) tablet 12.5 mg  12.5 mg Oral BID [name], MD   12.5 mg at 06/14/11 0940  . OLANZapine (ZYPREXA) tablet 10 mg  10 mg Oral Daily [name], MD   10 mg at 06/14/11 0939  . OLANZapine (ZYPREXA) tablet 20 mg  20 mg Oral QHS [name], MD   20 mg at 06/13/11 2239  . Paliperidone Palmitate SUSP 117 mg  117 mg Intramuscular Once [name], MD      . simvastatin (ZOCOR) tablet 20 mg  20 mg Oral q1800 [name], MD   20 mg at 06/13/11 1822  . ziprasidone (GEODON) injection 20 mg  20 mg Intramuscular Once [name], MD   20 mg at 06/12/11 1812  . DISCONTD: acetaminophen (TYLENOL) tablet 650 mg  650 mg Oral Q4H PRN Hilario Quarry, MD      . DISCONTD: benztropine (COGENTIN) tablet 1 mg  1 mg Oral BID Hurman Horn, MD   1 mg at 06/09/11 2102  . DISCONTD: cholecalciferol (VITAMIN D) tablet 800 Units  800 Units Oral q morning - 10a Hurman Horn, MD   800 Units at 06/09/11 1035  . DISCONTD: hydrochlorothiazide  (HYDRODIURIL) tablet 25 mg  25 mg Oral q morning - 10a Hurman Horn, MD   25 mg at 06/09/11 1041  . DISCONTD: insulin aspart (novoLOG) injection 0-20 Units  0-20 Units Subcutaneous Q4H Hilario Quarry, MD   4 Units at 06/09/11 1756  . DISCONTD: insulin aspart protamine-insulin aspart (NOVOLOG 70/30) injection 40 Units  40 Units Subcutaneous Q supper Hurman Horn, MD   40 Units at 06/07/11 1711  . DISCONTD: insulin aspart protamine-insulin aspart (NOVOLOG 70/30) injection 60 Units  60 Units Subcutaneous Q breakfast Hurman Horn, MD   60 Units at 06/09/11 1034  . DISCONTD: lisinopril (PRINIVIL,ZESTRIL) tablet 40 mg  40 mg Oral Daily Hurman Horn, MD   40 mg at 06/09/11 1040  . DISCONTD: lithium carbonate capsule 300 mg  300 mg Oral BID WC Hurman Horn, MD   300 mg at 06/09/11 2101  . DISCONTD: LORazepam (ATIVAN) tablet 1 mg  1 mg Oral Q8H PRN Hilario Quarry, MD   1 mg at 06/06/11 1901  . DISCONTD: metFORMIN (GLUCOPHAGE-XR) 24 hr tablet 1,000 mg  1,000 mg Oral BID Hurman Horn, MD   1,000 mg at 06/09/11 2102  . DISCONTD: metoprolol tartrate (LOPRESSOR) tablet 12.5 mg  12.5 mg Oral BID Hurman Horn, MD   12.5 mg at 06/09/11 2102  . DISCONTD: OLANZapine (ZYPREXA) tablet 20 mg  20 mg Oral Daily Hurman Horn, MD   20 mg at 06/09/11 1036  . DISCONTD: omega-3 acid ethyl esters (LOVAZA) capsule 1 g  1 g Oral Daily Hurman Horn, MD   1 g at 06/09/11 1035  . DISCONTD: ondansetron (ZOFRAN) tablet 4 mg  4 mg Oral Q8H PRN Hilario Quarry, MD      . DISCONTD: simvastatin (ZOCOR) tablet 20 mg  20 mg Oral QPM Hurman Horn, MD   20 mg at 06/09/11 2102  . DISCONTD: zolpidem (AMBIEN) tablet 5 mg  5 mg Oral QHS PRN Hilario Quarry, MD       Medications Prior to Admission  Medication Sig Dispense Refill  . benztropine (COGENTIN) 0.5 MG tablet Take 0.5 mg by mouth 2 (two) times daily. Takes with 1mg  tablet twice daily to equal dose of 1.5mg       . benztropine (COGENTIN) 1 MG tablet Take 1 mg by mouth 2 (two) times  daily. Takes with 0.5mg  tablet to equal 1.5mg  dose  twice daily      . cholecalciferol (VITAMIN D) 400 UNITS TABS Take 800 Units by mouth every morning.      . hydrochlorothiazide (HYDRODIURIL) 25 MG tablet Take 25 mg by mouth every morning.      . insulin aspart protamine-insulin aspart (NOVOLOG 70/30) (70-30) 100 UNIT/ML injection Inject 40-60 Units into the skin 2 (two) times daily with a meal. 60 units in the morning and 40 units in the evening      . lisinopril (PRINIVIL,ZESTRIL) 40 MG tablet Take 40 mg by mouth daily.      Marland Kitchen lithium carbonate 300 MG capsule Take 600-900 mg by mouth 2 (two) times daily. 600mg  every morning (2 capsules) and 900mg  every evening (3 capsules).      . metformin (FORTAMET) 1000 MG (OSM) 24 hr tablet Take 1,000 mg by mouth 2 (two) times daily.      . metoprolol tartrate (LOPRESSOR) 25 MG tablet Take 12.5 mg by mouth 2 (two) times daily.      Marland Kitchen OLANZapine (ZYPREXA) 10 MG tablet Take 10-20 mg by mouth 2 (two) times daily. Takes 1 tablet in the AM and takes 2 tablets at bedtime      . Omega-3 Fatty Acids 500 MG CAPS Take 1 capsule by mouth 2 (two) times daily.      . Paliperidone Palmitate (INVEGA SUSTENNA IM) Inject 117 mg into the muscle every 30 (thirty) days. Due 06/14/2011      . simvastatin (ZOCOR) 20 MG tablet Take 20 mg by mouth every evening.        OB/GYN Status:  Patient's last menstrual period was 05/02/2011.  General Assessment Data Location of Assessment: [facility] ACT Assessment: Yes Living Arrangements: Children Can pt return to current living arrangement?: Yes Admission Status: Involuntary Is patient capable of signing voluntary admission?: No Transfer from: Acute Hospital Referral Source: MD  Education Status Is patient currently in school?: No  Risk to self Suicidal Ideation: No Suicidal Intent: No Is patient at risk for suicide?: No Suicidal Plan?: No Access to Means: No What has been your use of drugs/alcohol within the last 12 months?:  none Previous Attempts/Gestures: No Other Self Harm Risks: none Triggers for Past Attempts: None known Intentional Self Injurious Behavior: None Family Suicide History: No Recent stressful life event(s): Recent negative physical changes Persecutory voices/beliefs?: Yes Depression: Yes Depression Symptoms: Insomnia;Loss of interest in usual pleasures Substance abuse history and/or treatment for substance abuse?: No Suicide prevention information given to non-admitted patients: Not applicable  Risk to Others Homicidal Ideation: No Thoughts of Harm to Others: No Comment - Thoughts of Harm to Others: in recent past Current Homicidal Intent: No Current Homicidal Plan: No Access to Homicidal Means: No Identified Victim: unknown- History of harm to others?: Yes Assessment of Violence: In distant past Violent Behavior Description: combative Does patient have access to weapons?: No Criminal Charges Pending?: No Does patient have a court date: No  Psychosis Hallucinations: None noted Delusions: Persecutory (thinks people will harm her and or her familty members)  Mental Status Report Appear/Hygiene: Improved Eye Contact: Fair Motor Activity: Freedom of movement;Shuffling Speech: Soft;Slow Level of Consciousness: Alert;Restless Mood: Depressed;Suspicious Affect: Blunted;Preoccupied Anxiety Level: None Thought Processes: Circumstantial Judgement: Impaired Orientation: Person;Place;Time Obsessive Compulsive Thoughts/Behaviors: Minimal  Cognitive Functioning Concentration: Decreased Memory: Recent Impaired;Remote Impaired IQ: Average Insight: Poor Impulse Control: Poor Appetite: Good Sleep: No Change Total Hours of Sleep: 4  Vegetative Symptoms: None  Prior Inpatient Therapy Prior Inpatient Therapy: Yes Prior Therapy Dates:  last asdmission to [facility] for 6 months Prior Therapy Facilty/Provider(s): [facility] Reason for Treatment: psychotic  Prior Outpatient Therapy Prior  Outpatient Therapy: Yes Prior Therapy Dates: 2012-2013 Prior Therapy Facilty/Provider([facility]): [name] Reason for Treatment: medications;ACTT Team            Values / Beliefs Cultural Requests During Hospitalization: None Spiritual Requests During Hospitalization: None     Nutrition Screen Diet: Regular  Additional Information 1:1 In Past 12 Months?: Yes CIRT Risk: No Elopement Risk: No Does patient have medical clearance?: Yes     Disposition:Spoke with [facility] staff in the admissions office. They are still on delay, with no bed availability. They will call when they have a bed. Dr. Bebe Shaggy made aware of the situation. He is in agreement with the disposition. Disposition Disposition of Patient: Inpatient treatment program Type of inpatient treatment program: Adult  On Site Evaluation by:   Reviewed with Physician:     Jearld Pies 06/14/2011 3:05 PM

## 2011-06-15 LAB — GLUCOSE, CAPILLARY: Glucose-Capillary: 113 mg/dL — ABNORMAL HIGH (ref 70–99)

## 2011-06-15 NOTE — Progress Notes (Signed)
Patient slept through the night. No behavioral issues.Continues to await placement.

## 2011-06-15 NOTE — BH Assessment (Signed)
Assessment Note   Emily Cordova is an 41 y.o. female. Emily Cordova remains in the ED awaiting placement at [facility]. She is still delusional. She thinks people will hurt her children or herself. She will talk about hurting someone if they were to harm her children. She remains depressed. She show little or no interest in anything.  She paces at times and at times has a shuffling gait.   Axis I: Chronic Paranoid Schizophrenia Axis II: Deferred Axis III:  Past Medical History  Diagnosis Date  . Schizophrenia   . Bipolar disorder   . Diabetes mellitus   . Hypercholesteremia   . Hypertension   . Kidney stone    Axis IV: problems related to legal system/crime and problems with access to health care services Axis V: 21-30 behavior considerably influenced by delusions or hallucinations OR serious impairment in judgment, communication OR inability to function in almost all areas  Past Medical History:  Past Medical History  Diagnosis Date  . Schizophrenia   . Bipolar disorder   . Diabetes mellitus   . Hypercholesteremia   . Hypertension   . Kidney stone     Past Surgical History  Procedure Date  . Cholecystectomy   . Tubal ligation     Family History: No family history on file.  Social History:  reports that she has been smoking.  She does not have any smokeless tobacco history on file. She reports that she does not drink alcohol or use illicit drugs.  Additional Social History:    Allergies:  Allergies  Allergen Reactions  . Haldol (Haloperidol Decanoate)     Home Medications:  Medications Prior to Admission  Medication Dose Route Frequency Provider Last Rate Last Dose  . benztropine (COGENTIN) tablet 0.5 mg  0.5 mg Oral BID [name], MD   0.5 mg at 06/15/11 1015  . benztropine (COGENTIN) tablet 1 mg  1 mg Oral BID [name], MD   1 mg at 06/15/11 1015  . hydrochlorothiazide (HYDRODIURIL) tablet 25 mg  25 mg Oral q morning - 10a [name], MD   25 mg at 06/15/11 1015    . insulin aspart protamine-insulin aspart (NOVOLOG 70/30) injection 40 Units  40 Units Subcutaneous Q supper [name], MD   40 Units at 06/13/11 1824  . insulin aspart protamine-insulin aspart (NOVOLOG 70/30) injection 60 Units  60 Units Subcutaneous Q breakfast [name], MD   60 Units at 06/15/11 0856  . lisinopril (PRINIVIL,ZESTRIL) tablet 40 mg  40 mg Oral Daily [name], MD   40 mg at 06/15/11 1015  . lithium carbonate capsule 600 mg  600 mg Oral Q breakfast [name], MD   600 mg at 06/15/11 0856  . lithium carbonate capsule 900 mg  900 mg Oral QHS [name], MD   900 mg at 06/13/11 2240  . metFORMIN (GLUCOPHAGE-XR) 24 hr tablet 1,000 mg  1,000 mg Oral BID WC [name], MD   1,000 mg at 06/15/11 0856  . metoprolol tartrate (LOPRESSOR) tablet 12.5 mg  12.5 mg Oral BID [name], MD   12.5 mg at 06/15/11 1016  . OLANZapine (ZYPREXA) tablet 10 mg  10 mg Oral Daily [name], MD   10 mg at 06/15/11 1016  . OLANZapine (ZYPREXA) tablet 20 mg  20 mg Oral QHS [name], MD   20 mg at 06/13/11 2239  . Paliperidone Palmitate SUSP 117 mg  117 mg Intramuscular Once [name], MD      . simvastatin (ZOCOR) tablet 20 mg  20 mg Oral q1800 [name], MD   20 mg at 06/14/11 1913  . ziprasidone (GEODON) injection 20 mg  20 mg Intramuscular Once [name], MD   20 mg at 06/12/11 1812  . DISCONTD: acetaminophen (TYLENOL) tablet 650 mg  650 mg Oral Q4H PRN Hilario Quarry, MD      . DISCONTD: benztropine (COGENTIN) tablet 1 mg  1 mg Oral BID Hurman Horn, MD   1 mg at 06/09/11 2102  . DISCONTD: cholecalciferol (VITAMIN D) tablet 800 Units  800 Units Oral q morning - 10a Hurman Horn, MD   800 Units at 06/09/11 1035  . DISCONTD: hydrochlorothiazide (HYDRODIURIL) tablet 25 mg  25 mg Oral q morning - 10a Hurman Horn, MD   25 mg at 06/09/11 1041  . DISCONTD: insulin aspart (novoLOG) injection 0-20 Units  0-20 Units Subcutaneous Q4H Hilario Quarry, MD   4 Units at 06/09/11 1756  .  DISCONTD: insulin aspart protamine-insulin aspart (NOVOLOG 70/30) injection 40 Units  40 Units Subcutaneous Q supper Hurman Horn, MD   40 Units at 06/07/11 1711  . DISCONTD: insulin aspart protamine-insulin aspart (NOVOLOG 70/30) injection 60 Units  60 Units Subcutaneous Q breakfast Hurman Horn, MD   60 Units at 06/09/11 1034  . DISCONTD: lisinopril (PRINIVIL,ZESTRIL) tablet 40 mg  40 mg Oral Daily Hurman Horn, MD   40 mg at 06/09/11 1040  . DISCONTD: lithium carbonate capsule 300 mg  300 mg Oral BID WC Hurman Horn, MD   300 mg at 06/09/11 2101  . DISCONTD: LORazepam (ATIVAN) tablet 1 mg  1 mg Oral Q8H PRN Hilario Quarry, MD   1 mg at 06/06/11 1901  . DISCONTD: metFORMIN (GLUCOPHAGE-XR) 24 hr tablet 1,000 mg  1,000 mg Oral BID Hurman Horn, MD   1,000 mg at 06/09/11 2102  . DISCONTD: metoprolol tartrate (LOPRESSOR) tablet 12.5 mg  12.5 mg Oral BID Hurman Horn, MD   12.5 mg at 06/09/11 2102  . DISCONTD: OLANZapine (ZYPREXA) tablet 20 mg  20 mg Oral Daily Hurman Horn, MD   20 mg at 06/09/11 1036  . DISCONTD: omega-3 acid ethyl esters (LOVAZA) capsule 1 g  1 g Oral Daily Hurman Horn, MD   1 g at 06/09/11 1035  . DISCONTD: ondansetron (ZOFRAN) tablet 4 mg  4 mg Oral Q8H PRN Hilario Quarry, MD      . DISCONTD: simvastatin (ZOCOR) tablet 20 mg  20 mg Oral QPM Hurman Horn, MD   20 mg at 06/09/11 2102  . DISCONTD: zolpidem (AMBIEN) tablet 5 mg  5 mg Oral QHS PRN Hilario Quarry, MD       Medications Prior to Admission  Medication Sig Dispense Refill  . benztropine (COGENTIN) 0.5 MG tablet Take 0.5 mg by mouth 2 (two) times daily. Takes with 1mg  tablet twice daily to equal dose of 1.5mg       . benztropine (COGENTIN) 1 MG tablet Take 1 mg by mouth 2 (two) times daily. Takes with 0.5mg  tablet to equal 1.5mg  dose twice daily      . cholecalciferol (VITAMIN D) 400 UNITS TABS Take 800 Units by mouth every morning.      . hydrochlorothiazide (HYDRODIURIL) 25 MG tablet Take 25 mg by mouth every  morning.      . insulin  aspart protamine-insulin aspart (NOVOLOG 70/30) (70-30) 100 UNIT/ML injection Inject 40-60 Units into the skin 2 (two) times daily with a meal. 60 units in the morning and 40 units in the evening      . lisinopril (PRINIVIL,ZESTRIL) 40 MG tablet Take 40 mg by mouth daily.      Marland Kitchen lithium carbonate 300 MG capsule Take 600-900 mg by mouth 2 (two) times daily. 600mg  every morning (2 capsules) and 900mg  every evening (3 capsules).      . metformin (FORTAMET) 1000 MG (OSM) 24 hr tablet Take 1,000 mg by mouth 2 (two) times daily.      . metoprolol tartrate (LOPRESSOR) 25 MG tablet Take 12.5 mg by mouth 2 (two) times daily.      Marland Kitchen OLANZapine (ZYPREXA) 10 MG tablet Take 10-20 mg by mouth 2 (two) times daily. Takes 1 tablet in the AM and takes 2 tablets at bedtime      . Omega-3 Fatty Acids 500 MG CAPS Take 1 capsule by mouth 2 (two) times daily.      . Paliperidone Palmitate (INVEGA SUSTENNA IM) Inject 117 mg into the muscle every 30 (thirty) days. Due 06/14/2011      . simvastatin (ZOCOR) 20 MG tablet Take 20 mg by mouth every evening.        OB/GYN Status:  Patient's last menstrual period was 05/02/2011.  General Assessment Data Location of Assessment: [facility] ACT Assessment: Yes Living Arrangements: Children Can pt return to current living arrangement?: Yes Admission Status: Involuntary Is patient capable of signing voluntary admission?: No Transfer from: Acute Hospital Referral Source: MD  Education Status Is patient currently in school?: No  Risk to self Suicidal Ideation: No Suicidal Intent: No Is patient at risk for suicide?: No Suicidal Plan?: No Access to Means: No What has been your use of drugs/alcohol within the last 12 months?: denies Previous Attempts/Gestures: No How many times?: 0  Other Self Harm Risks: none Triggers for Past Attempts: None known Intentional Self Injurious Behavior: None Family Suicide History: No Recent stressful life event(s):  Recent negative physical changes Persecutory voices/beliefs?: Yes Depression: Yes Depression Symptoms: Insomnia;Loss of interest in usual pleasures;Feeling angry/irritable Substance abuse history and/or treatment for substance abuse?: No Suicide prevention information given to non-admitted patients: Not applicable  Risk to Others Homicidal Ideation: No Thoughts of Harm to Others:  (in recent past) Comment - Thoughts of Harm to Others: in recent past Current Homicidal Intent: No Current Homicidal Plan: No Access to Homicidal Means: Yes (recently had butcher knife at [employer]) Describe Access to Homicidal Means: had butcher knife recently at [employer] Identified Victim: unknown- History of harm to others?: Yes Assessment of Violence: In past 6-12 months Violent Behavior Description: combative Does patient have access to weapons?: No Criminal Charges Pending?: No Does patient have a court date: No  Psychosis Hallucinations: None noted Delusions: Persecutory  Mental Status Report Appear/Hygiene: Improved Eye Contact: Fair Motor Activity: Freedom of movement;Shuffling Speech: Soft;Slow Level of Consciousness: Alert;Restless Mood: Depressed;Empty Affect: Blunted;Preoccupied Anxiety Level: None Thought Processes: Circumstantial Judgement: Impaired Orientation: Person;Place;Time Obsessive Compulsive Thoughts/Behaviors: Minimal  Cognitive Functioning Concentration: Decreased Memory: Recent Impaired;Remote Impaired IQ: Average Insight: Poor Impulse Control: Poor Appetite: Good Sleep: No Change Total Hours of Sleep: 4  Vegetative Symptoms: None  Prior Inpatient Therapy Prior Inpatient Therapy: Yes Prior Therapy Dates: last asdmission to [facility] for 6 months Prior Therapy Facilty/Provider(s): [facility] Reason for Treatment: psychotic  Prior Outpatient Therapy Prior Outpatient Therapy: Yes Prior Therapy Dates: 2012-2013 Prior Therapy  [facility] Reason for Treatment: medications;ACTT Team            Values / Beliefs Cultural Requests During Hospitalization: None Spiritual Requests During Hospitalization: None     Nutrition Screen Diet: Regular  Additional Information 1:1 In Past 12 Months?: Yes CIRT Risk: No Elopement Risk: No Does patient have medical clearance?: Yes     Disposition: Spoke with Talbert Forest in the admitting office.l No beds at this current time. Patient to remain in the ED. Dr. Adriana Simas is in agreement with this plan. Disposition Disposition of Patient: Inpatient treatment program Type of inpatient treatment program: Adult  On Site Evaluation by:   Reviewed with Physician:     Jearld Pies 06/15/2011 4:12 PM

## 2011-06-16 LAB — GLUCOSE, CAPILLARY
Glucose-Capillary: 66 mg/dL — ABNORMAL LOW (ref 70–99)
Glucose-Capillary: 126 mg/dL — ABNORMAL HIGH (ref 70–99)
Glucose-Capillary: 122 mg/dL — ABNORMAL HIGH (ref 70–99)

## 2011-06-16 MED ORDER — METOPROLOL TARTRATE 50 MG PO TABS
ORAL_TABLET | ORAL | Status: AC
  Filled 2011-06-16: qty 1

## 2011-06-16 NOTE — ED Notes (Signed)
Per ella from act, pt is still [facility].

## 2011-06-16 NOTE — ED Notes (Signed)
Pt provided diet drink & saltine crackers.

## 2011-06-16 NOTE — ED Notes (Signed)
Pt resting in room.  Pt sitting in chair with table in front of her.  Pt pleasant at this time.  Wants to know when she is getting to leave.

## 2011-06-16 NOTE — ED Notes (Signed)
Pt given crackers and diet ccoke, sitter remains at bedside.

## 2011-06-16 NOTE — ED Notes (Signed)
Pt declined offer for am care at this time.

## 2011-06-16 NOTE — Progress Notes (Signed)
Patient remains psychotic. She is awaiting placement. No behavioral issues overnight.

## 2011-06-16 NOTE — ED Notes (Signed)
Pt resting calmly w/ eyes closed. Rise & fall of the chest noted. Sitter in hallway. Bed in low position, side rails up x2. NAD noted at this time.  

## 2011-06-16 NOTE — ED Notes (Signed)
Pt woke for am meal , bs 66, insulin held at this time.  Breakfast tray given, sitter at bedside. Pt denies complaints.

## 2011-06-16 NOTE — ED Notes (Signed)
Tammy Sours w/ ACT team in seeing pt at this time.

## 2011-06-16 NOTE — ED Notes (Signed)
Pt states wanting to take a bath, pt advised when personal available we would get her to the shower. No other needs voiced. Pt sitting in chair. Sitter remains in view of pt.

## 2011-06-16 NOTE — BH Assessment (Addendum)
Assessment Note   Emily Cordova is an 41 y.o. female. Reassessment of pt on wait list at [facility].  Pt has chronic schizophrenia and was committed after decompensating apparently due to not taking meds properly.  Pt reported that someone was trying to kill her when she first arrived at ED.  Today pt appears calm and answered all questions.  Pt today stated numerous times that she had been looking for her children on the day she came to [facility] and she still does not know where they are or if they are OK.  Pt did not talk of someone trying to kill her.  Pt continues to deny HI/SI along with AV hallucinations.  Pt mood was pleasant but she said she was not aware she would be returning to the hospital at [facility]    Axis I: paranoid schizophrenia Axis II: Deferred Axis III:  Past Medical History  Diagnosis Date  . Schizophrenia   . Bipolar disorder   . Diabetes mellitus   . Hypercholesteremia   . Hypertension   . Kidney stone    Axis IV: problems related to legal system/crime and problems with access to health care services Axis V: 21-30 behavior considerably influenced by delusions or hallucinations OR serious impairment in judgment, communication OR inability to function in almost all areas  Past Medical History:  Past Medical History  Diagnosis Date  . Schizophrenia   . Bipolar disorder   . Diabetes mellitus   . Hypercholesteremia   . Hypertension   . Kidney stone     Past Surgical History  Procedure Date  . Cholecystectomy   . Tubal ligation     Family History: No family history on file.  Social History:  reports that she has been smoking.  She does not have any smokeless tobacco history on file. She reports that she does not drink alcohol or use illicit drugs.  Additional Social History:  Alcohol / Drug Use History of alcohol / drug use?: No history of alcohol / drug abuse Allergies:  Allergies  Allergen Reactions  . Haldol (Haloperidol Decanoate)     Home Medications:    Medications Prior to Admission  Medication Dose Route Frequency Provider Last Rate Last Dose  . benztropine (COGENTIN) tablet 0.5 mg  0.5 mg Oral BID [name], MD   0.5 mg at 06/16/11 2214  . benztropine (COGENTIN) tablet 1 mg  1 mg Oral BID [name], MD   1 mg at 06/16/11 2212  . hydrochlorothiazide (HYDRODIURIL) tablet 25 mg  25 mg Oral q morning - 10a [name], MD   25 mg at 06/16/11 1107  . insulin aspart protamine-insulin aspart (NOVOLOG 70/30) injection 40 Units  40 Units Subcutaneous Q supper [name], MD   40 Units at 06/15/11 1721  . insulin aspart protamine-insulin aspart (NOVOLOG 70/30) injection 60 Units  60 Units Subcutaneous Q breakfast [name], MD   60 Units at 06/15/11 0856  . lisinopril (PRINIVIL,ZESTRIL) tablet 40 mg  40 mg Oral Daily [name], MD   40 mg at 06/16/11 1106  . lithium carbonate capsule 600 mg  600 mg Oral Q breakfast [name], MD   600 mg at 06/16/11 0801  . lithium carbonate capsule 900 mg  900 mg Oral QHS [name], MD   900 mg at 06/16/11 2210  . metFORMIN (GLUCOPHAGE-XR) 24 hr tablet 1,000 mg  1,000 mg Oral BID WC [name], MD  1,000 mg at 06/16/11 1819  . metoprolol (LOPRESSOR) 50 MG tablet           . metoprolol tartrate (LOPRESSOR) tablet 12.5 mg  12.5 mg Oral BID [name], MD   12.5 mg at 06/16/11 2211  . OLANZapine (ZYPREXA) tablet 10 mg  10 mg Oral Daily [name], MD   10 mg at 06/16/11 1106  . OLANZapine (ZYPREXA) tablet 20 mg  20 mg Oral QHS [name], MD   20 mg at 06/16/11 2208  . Paliperidone Palmitate SUSP 117 mg  117 mg Intramuscular Once [name], MD      . simvastatin (ZOCOR) tablet 20 mg  20 mg Oral q1800 [name], MD   20 mg at 06/16/11 1822  . ziprasidone (GEODON) injection 20 mg  20 mg Intramuscular Once [name], MD   20 mg at 06/12/11 1812  . DISCONTD: acetaminophen (TYLENOL) tablet 650 mg  650 mg Oral Q4H PRN Hilario Quarry, MD      . DISCONTD: benztropine (COGENTIN) tablet 1 mg  1 mg Oral BID  Hurman Horn, MD   1 mg at 06/09/11 2102  . DISCONTD: cholecalciferol (VITAMIN D) tablet 800 Units  800 Units Oral q morning - 10a Hurman Horn, MD   800 Units at 06/09/11 1035  . DISCONTD: hydrochlorothiazide (HYDRODIURIL) tablet 25 mg  25 mg Oral q morning - 10a Hurman Horn, MD   25 mg at 06/09/11 1041  . DISCONTD: insulin aspart (novoLOG) injection 0-20 Units  0-20 Units Subcutaneous Q4H Hilario Quarry, MD   4 Units at 06/09/11 1756  . DISCONTD: insulin aspart protamine-insulin aspart (NOVOLOG 70/30) injection 40 Units  40 Units Subcutaneous Q supper Hurman Horn, MD   40 Units at 06/07/11 1711  . DISCONTD: insulin aspart protamine-insulin aspart (NOVOLOG 70/30) injection 60 Units  60 Units Subcutaneous Q breakfast Hurman Horn, MD   60 Units at 06/09/11 1034  . DISCONTD: lisinopril (PRINIVIL,ZESTRIL) tablet 40 mg  40 mg Oral Daily Hurman Horn, MD   40 mg at 06/09/11 1040  . DISCONTD: lithium carbonate capsule 300 mg  300 mg Oral BID WC Hurman Horn, MD   300 mg at 06/09/11 2101  . DISCONTD: LORazepam (ATIVAN) tablet 1 mg  1 mg Oral Q8H PRN Hilario Quarry, MD   1 mg at 06/06/11 1901  . DISCONTD: metFORMIN (GLUCOPHAGE-XR) 24 hr tablet 1,000 mg  1,000 mg Oral BID Hurman Horn, MD   1,000 mg at 06/09/11 2102  . DISCONTD: metoprolol tartrate (LOPRESSOR) tablet 12.5 mg  12.5 mg Oral BID Hurman Horn, MD   12.5 mg at 06/09/11 2102  . DISCONTD: OLANZapine (ZYPREXA) tablet 20 mg  20 mg Oral Daily Hurman Horn, MD   20 mg at 06/09/11 1036  . DISCONTD: omega-3 acid ethyl esters (LOVAZA) capsule 1 g  1 g Oral Daily Hurman Horn, MD   1 g at 06/09/11 1035  . DISCONTD: ondansetron (ZOFRAN) tablet 4 mg  4 mg Oral Q8H PRN Hilario Quarry, MD      . DISCONTD: simvastatin (ZOCOR) tablet 20 mg  20 mg Oral QPM Hurman Horn, MD   20 mg at 06/09/11 2102  . DISCONTD: zolpidem (AMBIEN) tablet 5 mg  5 mg Oral QHS PRN Hilario Quarry, MD       Medications Prior to Admission  Medication Sig Dispense Refill    .  benztropine (COGENTIN) 0.5 MG tablet Take 0.5 mg by mouth 2 (two) times daily. Takes with 1mg  tablet twice daily to equal dose of 1.5mg       . benztropine (COGENTIN) 1 MG tablet Take 1 mg by mouth 2 (two) times daily. Takes with 0.5mg  tablet to equal 1.5mg  dose twice daily      . cholecalciferol (VITAMIN D) 400 UNITS TABS Take 800 Units by mouth every morning.      . hydrochlorothiazide (HYDRODIURIL) 25 MG tablet Take 25 mg by mouth every morning.      . insulin aspart protamine-insulin aspart (NOVOLOG 70/30) (70-30) 100 UNIT/ML injection Inject 40-60 Units into the skin 2 (two) times daily with a meal. 60 units in the morning and 40 units in the evening      . lisinopril (PRINIVIL,ZESTRIL) 40 MG tablet Take 40 mg by mouth daily.      Marland Kitchen lithium carbonate 300 MG capsule Take 600-900 mg by mouth 2 (two) times daily. 600mg  every morning (2 capsules) and 900mg  every evening (3 capsules).      . metformin (FORTAMET) 1000 MG (OSM) 24 hr tablet Take 1,000 mg by mouth 2 (two) times daily.      . metoprolol tartrate (LOPRESSOR) 25 MG tablet Take 12.5 mg by mouth 2 (two) times daily.      Marland Kitchen OLANZapine (ZYPREXA) 10 MG tablet Take 10-20 mg by mouth 2 (two) times daily. Takes 1 tablet in the AM and takes 2 tablets at bedtime      . Omega-3 Fatty Acids 500 MG CAPS Take 1 capsule by mouth 2 (two) times daily.      . Paliperidone Palmitate (INVEGA SUSTENNA IM) Inject 117 mg into the muscle every 30 (thirty) days. Due 06/14/2011      . simvastatin (ZOCOR) 20 MG tablet Take 20 mg by mouth every evening.        OB/GYN Status:  Patient's last menstrual period was 05/02/2011.  General Assessment Data Location of Assessment: [facility] ACT Assessment: Yes Living Arrangements: Children Can pt return to current living arrangement?: Yes Admission Status: Involuntary Is patient capable of signing voluntary admission?: No Transfer from: Acute Hospital Referral Source: MD  Education Status Is patient currently in  school?: No  Risk to self Suicidal Ideation: No Suicidal Intent: No Is patient at risk for suicide?: No Suicidal Plan?: No Access to Means: No What has been your use of drugs/alcohol within the last 12 months?: denies use Previous Attempts/Gestures: Yes How many times?: 1  Other Self Harm Risks: none Triggers for Past Attempts: Unknown Intentional Self Injurious Behavior: None Family Suicide History: No Recent stressful life event(s): Other (Comment) (Pt reports she is worried about her kids) Persecutory voices/beliefs?: No Depression: No Depression Symptoms: Insomnia;Loss of interest in usual pleasures;Feeling angry/irritable Substance abuse history and/or treatment for substance abuse?: No Suicide prevention information given to non-admitted patients: Not applicable  Risk to Others Homicidal Ideation: No Thoughts of Harm to Others: No Comment - Thoughts of Harm to Others: in recent past Current Homicidal Intent: No Current Homicidal Plan: No Access to Homicidal Means: No Describe Access to Homicidal Means: had butcher knife recently at [employer] Identified Victim: unknown- History of harm to others?: Yes (per previous assessment.  PT denies) Assessment of Violence: In distant past Violent Behavior Description: previous problems in ED Does patient have access to weapons?: No Criminal Charges Pending?: Yes Describe Pending Criminal Charges: fighting? Does patient have a court date: Yes  Psychosis Hallucinations: None noted Delusions:  Persecutory  Mental Status Report Appear/Hygiene: Other (Comment) (casual) Eye Contact: Good Motor Activity: Unremarkable Speech: Soft Level of Consciousness: Alert Mood: Other (Comment);Silly (worried about kids) Affect: Preoccupied Anxiety Level: Minimal Thought Processes: Relevant Judgement: Impaired Orientation: Person;Place;Time Obsessive Compulsive Thoughts/Behaviors: Minimal  Cognitive Functioning Concentration:  Decreased Memory: Recent Impaired;Remote Impaired IQ: Average Insight: Poor Impulse Control: Poor Appetite: Fair Weight Loss: 20  (pt uncertain ) Weight Gain: 0  Sleep: Decreased Total Hours of Sleep: 4  Vegetative Symptoms: None  Prior Inpatient Therapy Prior Inpatient Therapy: Yes Prior Therapy Dates: [facility]dmission to [facility] for 6 months Prior Therapy Facilty/Provider(s): [facility] Reason for Treatment: psychotic  Prior Outpatient Therapy Prior Outpatient Therapy: Yes Prior Therapy Dates: 2012-2013 Prior Therapy Facilty/Provider(s): [name] Reason for Treatment: medications;ACTT Team          Abuse/Neglect Assessment (Assessment to be complete while patient is alone) Physical Abuse: Denies Verbal Abuse: Denies Sexual Abuse: Denies Exploitation of patient/patient's resources: Denies Self-Neglect: Denies Values / Beliefs Cultural Requests During Hospitalization: None Spiritual Requests During Hospitalization: None     Nutrition Screen Diet: Regular  Additional Information 1:1 In Past 12 Months?: Yes CIRT Risk: Yes Elopement Risk: Yes Does patient have medical clearance?: Yes     Disposition: Contacted Lynn at [facility] who reported that they did not have authorization for pt.  Contacted Chris at [facility] who said authorization was completed on 4/7, #[id number] for 7 days.  Gave this to [employer].  Pt remains on wait list. Disposition Disposition of Patient: Inpatient treatment program Type of inpatient treatment program: Adult  On Site Evaluation by:   Reviewed with Physician:     Lorri Frederick 06/16/2011 10:42 PM

## 2011-06-17 LAB — GLUCOSE, CAPILLARY: Glucose-Capillary: 145 mg/dL — ABNORMAL HIGH (ref 70–99)

## 2011-06-17 MED ORDER — INSULIN ASPART PROT & ASPART (70-30 MIX) 100 UNIT/ML ~~LOC~~ SUSP
SUBCUTANEOUS | Status: AC
  Filled 2011-06-17: qty 10

## 2011-06-17 NOTE — ED Notes (Signed)
Pt resting calmly w/ eyes closed. Rise & fall of the chest noted. Bed in low position, side rails up x2. NAD noted at this time.  

## 2011-06-17 NOTE — ED Notes (Signed)
Pt offered shower - pt states she just wants to wash up in room.  Pt doing so at this time.  Clean paper scrubs provided.  Replaced stretcher with hospital bed for pt comfort.  Cleaned linen provided.  Pt calm, cooperative, coherent. Sitter at bedside.  nad noted.

## 2011-06-17 NOTE — ED Notes (Signed)
Received report from [facility]. NAD at this time. Sitter remains at bedside. Will continue to monitor.

## 2011-06-17 NOTE — ED Notes (Signed)
Pt awakened to verbal stimuli to take morning meds.  Breakfast tray heated for pt.  Pt fell back asleep after taking meds.  nad noted.  Sitter at bedside.

## 2011-06-17 NOTE — Progress Notes (Signed)
Patient continues to await placement at [facility]. No behavioral issues overnight.

## 2011-06-17 NOTE — ED Notes (Addendum)
Pt sitting on side of bed eating lunch tray.  nad noted.  Sitter at bedside.

## 2011-06-17 NOTE — ED Notes (Signed)
Received report from Missy Sabins, rN

## 2011-06-17 NOTE — Progress Notes (Signed)
Patient remains waiting for [facility] bed. Re-evaluated by ACT. Remains psychotic. Slept through the night. No behavioral issues.

## 2011-06-17 NOTE — ED Notes (Signed)
Pt sleeping at this time.  Awakened to verbal stimuli.  States does not want breakfast at this time, she wants to sleep.  0800 meds held until pt is ready to eat.  Sitter at bedside.  Pt calm, cooperative.  nad noted.

## 2011-06-17 NOTE — ED Notes (Signed)
Pt sitting in chair eating dinner tray.  Pt calm, cooperative.  nad noted.  Sitter at bedside.

## 2011-06-17 NOTE — ED Notes (Signed)
Pt ate 100% of meal tray.  Pt up to bathroom with sitter.  nad noted.

## 2011-06-18 LAB — GLUCOSE, CAPILLARY
Glucose-Capillary: 79 mg/dL (ref 70–99)
Glucose-Capillary: 126 mg/dL — ABNORMAL HIGH (ref 70–99)

## 2011-06-18 NOTE — BH Assessment (Addendum)
Assessment Note   Emily Cordova is an 41 y.o. female. This is reassessment of pt on wait list at [facility].  Pt on involuntary commitment and was brought to [facility] ED after being picked up from a [employer] where she was found with a butcher knife talking about someone trying to harm her children, one of which pt said was in the basement at the [employer].  Pt appears to be doing better upon assessment today.  She was calm and cooperative and answered all questions.  Pt did not report any SI/HI/AV.  She did not say anything about people trying to kill her or harm her children.  Her only question remains that she has been unable to speak to her children since arriving at ED and she is worried about them.  Jeani Hawking RN reports her children are grown.  Explained to pt that we are still waiting for a bed at [facility].  Pt was able to describe how the ACT team meets with 3 times per week to monitor how she is doing and help with meds.  Axis I: paranoid schizophrenia Axis II: deferred Axis III:  Past Medical History  Diagnosis Date  . Schizophrenia   . Bipolar disorder   . Diabetes mellitus   . Hypercholesteremia   . Hypertension   . Kidney stone    Axis IV: none reported Axis V: 21-30 behavior considerably influenced by delusions or hallucinations OR serious impairment in judgment, communication OR inability to function in almost all areas  Past Medical History:  Past Medical History  Diagnosis Date  . Schizophrenia   . Bipolar disorder   . Diabetes mellitus   . Hypercholesteremia   . Hypertension   . Kidney stone     Past Surgical History  Procedure Date  . Cholecystectomy   . Tubal ligation     Family History: No family history on file.  Social History:  reports that she has been smoking.  She does not have any smokeless tobacco history on file. She reports that she does not drink alcohol or use illicit drugs.  Additional Social History:  Alcohol / Drug Use History of alcohol / drug  use?: No history of alcohol / drug abuse Allergies:  Allergies  Allergen Reactions  . Haldol (Haloperidol Decanoate)     Home Medications:  Medications Prior to Admission  Medication Dose Route Frequency Provider Last Rate Last Dose  . benztropine (COGENTIN) tablet 0.5 mg  0.5 mg Oral BID [name], MD   0.5 mg at 06/18/11 0823  . benztropine (COGENTIN) tablet 1 mg  1 mg Oral BID [name], MD   1 mg at 06/18/11 0823  . hydrochlorothiazide (HYDRODIURIL) tablet 25 mg  25 mg Oral q morning - 10a [name], MD   25 mg at 06/18/11 0824  . insulin aspart protamine-insulin aspart (NOVOLOG 70/30) injection 40 Units  40 Units Subcutaneous Q supper [name], MD   40 Units at 06/17/11 1807  . insulin aspart protamine-insulin aspart (NOVOLOG 70/30) injection 60 Units  60 Units Subcutaneous Q breakfast [name], MD   60 Units at 06/15/11 0856  . lisinopril (PRINIVIL,ZESTRIL) tablet 40 mg  40 mg Oral Daily [name], MD   40 mg at 06/18/11 0824  . lithium carbonate capsule 600 mg  600 mg Oral Q breakfast [name], MD   600 mg at 06/18/11 1610  . lithium carbonate capsule 900 mg  900 mg Oral  QHS [name], MD   900 mg at 06/17/11 2220  . metFORMIN (GLUCOPHAGE-XR) 24 hr tablet 1,000 mg  1,000 mg Oral BID WC [name], MD   1,000 mg at 06/18/11 1610  . metoprolol (LOPRESSOR) 50 MG tablet           . metoprolol tartrate (LOPRESSOR) tablet 12.5 mg  12.5 mg Oral BID [name], MD   12.5 mg at 06/17/11 1011  . OLANZapine (ZYPREXA) tablet 10 mg  10 mg Oral Daily [name], MD   10 mg at 06/18/11 0825  . OLANZapine (ZYPREXA) tablet 20 mg  20 mg Oral QHS [name], MD   20 mg at 06/17/11 2220  . Paliperidone Palmitate SUSP 117 mg  117 mg Intramuscular Once [name], MD      . simvastatin (ZOCOR) tablet 20 mg  20 mg Oral q1800 [name], MD   20 mg at 06/17/11 1807  . ziprasidone (GEODON) injection 20 mg  20 mg Intramuscular Once [name], MD   20 mg at 06/12/11 1812  . DISCONTD:  acetaminophen (TYLENOL) tablet 650 mg  650 mg Oral Q4H PRN Hilario Quarry, MD      . DISCONTD: benztropine (COGENTIN) tablet 1 mg  1 mg Oral BID Hurman Horn, MD   1 mg at 06/09/11 2102  . DISCONTD: cholecalciferol (VITAMIN D) tablet 800 Units  800 Units Oral q morning - 10a Hurman Horn, MD   800 Units at 06/09/11 1035  . DISCONTD: hydrochlorothiazide (HYDRODIURIL) tablet 25 mg  25 mg Oral q morning - 10a Hurman Horn, MD   25 mg at 06/09/11 1041  . DISCONTD: insulin aspart (novoLOG) injection 0-20 Units  0-20 Units Subcutaneous Q4H Hilario Quarry, MD   4 Units at 06/09/11 1756  . DISCONTD: insulin aspart protamine-insulin aspart (NOVOLOG 70/30) injection 40 Units  40 Units Subcutaneous Q supper Hurman Horn, MD   40 Units at 06/07/11 1711  . DISCONTD: insulin aspart protamine-insulin aspart (NOVOLOG 70/30) injection 60 Units  60 Units Subcutaneous Q breakfast Hurman Horn, MD   60 Units at 06/09/11 1034  . DISCONTD: lisinopril (PRINIVIL,ZESTRIL) tablet 40 mg  40 mg Oral Daily Hurman Horn, MD   40 mg at 06/09/11 1040  . DISCONTD: lithium carbonate capsule 300 mg  300 mg Oral BID WC Hurman Horn, MD   300 mg at 06/09/11 2101  . DISCONTD: LORazepam (ATIVAN) tablet 1 mg  1 mg Oral Q8H PRN Hilario Quarry, MD   1 mg at 06/06/11 1901  . DISCONTD: metFORMIN (GLUCOPHAGE-XR) 24 hr tablet 1,000 mg  1,000 mg Oral BID Hurman Horn, MD   1,000 mg at 06/09/11 2102  . DISCONTD: metoprolol tartrate (LOPRESSOR) tablet 12.5 mg  12.5 mg Oral BID Hurman Horn, MD   12.5 mg at 06/09/11 2102  . DISCONTD: OLANZapine (ZYPREXA) tablet 20 mg  20 mg Oral Daily Hurman Horn, MD   20 mg at 06/09/11 1036  . DISCONTD: omega-3 acid ethyl esters (LOVAZA) capsule 1 g  1 g Oral Daily Hurman Horn, MD   1 g at 06/09/11 1035  . DISCONTD: ondansetron (ZOFRAN) tablet 4 mg  4 mg Oral Q8H PRN Hilario Quarry, MD      . DISCONTD: simvastatin (ZOCOR) tablet 20 mg  20 mg Oral QPM Hurman Horn, MD   20 mg at 06/09/11 2102  .  DISCONTD: zolpidem (AMBIEN) tablet 5 mg  5 mg Oral QHS PRN Hilario Quarry, MD       Medications Prior to Admission  Medication Sig Dispense Refill  . benztropine (COGENTIN) 0.5 MG tablet Take 0.5 mg by mouth 2 (two) times daily. Takes with 1mg  tablet twice daily to equal dose of 1.5mg       . benztropine (COGENTIN) 1 MG tablet Take 1 mg by mouth 2 (two) times daily. Takes with 0.5mg  tablet to equal 1.5mg  dose twice daily      . cholecalciferol (VITAMIN D) 400 UNITS TABS Take 800 Units by mouth every morning.      . hydrochlorothiazide (HYDRODIURIL) 25 MG tablet Take 25 mg by mouth every morning.      . insulin aspart protamine-insulin aspart (NOVOLOG 70/30) (70-30) 100 UNIT/ML injection Inject 40-60 Units into the skin 2 (two) times daily with a meal. 60 units in the morning and 40 units in the evening      . lisinopril (PRINIVIL,ZESTRIL) 40 MG tablet Take 40 mg by mouth daily.      Marland Kitchen lithium carbonate 300 MG capsule Take 600-900 mg by mouth 2 (two) times daily. 600mg  every morning (2 capsules) and 900mg  every evening (3 capsules).      . metformin (FORTAMET) 1000 MG (OSM) 24 hr tablet Take 1,000 mg by mouth 2 (two) times daily.      . metoprolol tartrate (LOPRESSOR) 25 MG tablet Take 12.5 mg by mouth 2 (two) times daily.      Marland Kitchen OLANZapine (ZYPREXA) 10 MG tablet Take 10-20 mg by mouth 2 (two) times daily. Takes 1 tablet in the AM and takes 2 tablets at bedtime      . Omega-3 Fatty Acids 500 MG CAPS Take 1 capsule by mouth 2 (two) times daily.      . Paliperidone Palmitate (INVEGA SUSTENNA IM) Inject 117 mg into the muscle every 30 (thirty) days. Due 06/14/2011      . simvastatin (ZOCOR) 20 MG tablet Take 20 mg by mouth every evening.        OB/GYN Status:  Patient's last menstrual period was 05/02/2011.  General Assessment Data Location of Assessment: [facility] ACT Assessment: Yes Living Arrangements: Children (adult children, per RN) Can pt return to current living arrangement?: Yes Admission  Status: Involuntary Is patient capable of signing voluntary admission?: No Transfer from: Acute Hospital Referral Source: MD  Education Status Is patient currently in school?: No  Risk to self Suicidal Ideation: No Suicidal Intent: No Is patient at risk for suicide?: No Suicidal Plan?: No Access to Means: No What has been your use of drugs/alcohol within the last 12 months?: denies Previous Attempts/Gestures: No How many times?: 1  Other Self Harm Risks: none Triggers for Past Attempts: Unknown Intentional Self Injurious Behavior: None Family Suicide History: No Recent stressful life event(s): Other (Comment) (pt continues to say only stressor is location of her childre) Persecutory voices/beliefs?: No Depression: No Depression Symptoms: Insomnia;Loss of interest in usual pleasures;Feeling angry/irritable Substance abuse history and/or treatment for substance abuse?: No Suicide prevention information given to non-admitted patients: Not applicable  Risk to Others Homicidal Ideation: No Thoughts of Harm to Others: No Comment - Thoughts of Harm to Others:  (Pt was found with butcher knife at [facility] on 4/7) Current Homicidal Intent: No Current Homicidal Plan: No Access to Homicidal Means: No Describe Access to Homicidal Means: had butcher knife recently at [employer] Identified Victim: unknown- History of harm to others?: Yes Assessment  of Violence: In distant past Violent Behavior Description: fights Does patient have access to weapons?: No Criminal Charges Pending?: Yes Describe Pending Criminal Charges: pt reports charge for fighting from 06/2010 Does patient have a court date: Yes Court Date:  (May 2013)  Psychosis Hallucinations: None noted Delusions: Persecutory  Mental Status Report Appear/Hygiene: Other (Comment) (casual) Eye Contact: Good Motor Activity: Unremarkable Speech: Logical/coherent Level of Consciousness: Alert Mood: Other (Comment)  (pleasant) Affect: Appropriate to circumstance Anxiety Level: Minimal Thought Processes: Relevant Judgement: Impaired Orientation: Person;Place;Time Obsessive Compulsive Thoughts/Behaviors: Minimal  Cognitive Functioning Concentration: Decreased Memory: Recent Intact;Remote Intact IQ: Average Insight: Poor Impulse Control: Poor Appetite: Good Weight Loss: 20  (pt uncertain ) Weight Gain: 0  Sleep: No Change Total Hours of Sleep: 4  Vegetative Symptoms: None  Prior Inpatient Therapy Prior Inpatient Therapy: Yes Prior Therapy Dates: [facility] asdmission to [facility] for 6 months Prior Therapy Facilty/Provider(s): [facility] Reason for Treatment: psychotic  Prior Outpatient Therapy Prior Outpatient Therapy: Yes Prior Therapy Dates: 2012-2013 Prior Therapy Facilty/Provider(s): [name] Reason for Treatment: medications;ACTT Team          Abuse/Neglect Assessment (Assessment to be complete while patient is alone) Physical Abuse: Denies Verbal Abuse: Denies Sexual Abuse: Denies Exploitation of patient/patient's resources: Denies Self-Neglect: Denies Values / Beliefs Cultural Requests During Hospitalization: None Spiritual Requests During Hospitalization: None     Nutrition Screen Diet: Regular  Additional Information 1:1 In Past 12 Months?: Yes CIRT Risk: Yes Elopement Risk: Yes Does patient have medical clearance?: Yes     Disposition: Contacted Ms Willa Rough at [facility], pt remains on wait list.  No update as to her position on the list.  Ms Willa Rough did note that there have been no adult discharges this entire weekend. Dr Manus Gunning informed.  Discussed commitment papers with Tommy Murphy--Ms Viana requires the affadavit/petition and a custody order which he obtains through the clerk of court during Monday-Friday business hours.  Ms Oldenkamp's custody order expires tomorrow and he will do another on at that time. Disposition Disposition of Patient: Inpatient treatment program Type of  inpatient treatment program: Adult  On Site Evaluation by:   Reviewed with Physician:     Lorri Frederick 06/18/2011 2:50 PM

## 2011-06-18 NOTE — ED Notes (Signed)
Patient remains sleeping in bed at this time. Rise and fall of chest noted. Sitter at entrance to room.

## 2011-06-19 LAB — GLUCOSE, CAPILLARY: Glucose-Capillary: 121 mg/dL — ABNORMAL HIGH (ref 70–99)

## 2011-06-19 NOTE — ED Notes (Signed)
Pt sitting up in chair in room, states that she feels better, requesting supper tray, supper tray has been ordered for pt.

## 2011-06-19 NOTE — ED Notes (Signed)
Pt sleeping, will arouse, sitter at bedside,

## 2011-06-19 NOTE — ED Notes (Signed)
Pt bathed herself in her room per her request, bed linen changed.

## 2011-06-19 NOTE — ED Notes (Addendum)
Patient was given breakfast.  

## 2011-06-19 NOTE — ED Notes (Signed)
Pt sleeping, will arouse, pt cooperative at present time, delay explained to pt, sitter at bedside,

## 2011-06-19 NOTE — ED Notes (Signed)
Pt eating diner tray

## 2011-06-19 NOTE — ED Notes (Signed)
Pt ambulatory to restroom, sitter at bedside,  

## 2011-06-19 NOTE — ED Notes (Signed)
Pharmacy notified of need of pt medication

## 2011-06-19 NOTE — BH Assessment (Signed)
Assessment Note   Emily Cordova is an 41 y.o. female. Ms Havey remains under IVC and waits in the [place] [facility] for a bed at [facility]. She is still paranoid and at times responds to internal stimuli. Due to the dangerousness of the events leading to her commitment  She cannot return to the community safely. She was taken into custody by [place] PD when she was in the [employer] parking lot with a Engineer, water.  Axis I: Chronic Paranoid Schizophrenia Axis II: Deferred Axis III:  Past Medical History  Diagnosis Date  . Schizophrenia   . Bipolar disorder   . Diabetes mellitus   . Hypercholesteremia   . Hypertension   . Kidney stone    Axis IV: problems related to legal system/crime and problems with access to health care services Axis V: 21-30 behavior considerably influenced by delusions or hallucinations OR serious impairment in judgment, communication OR inability to function in almost all areas  Past Medical History:  Past Medical History  Diagnosis Date  . Schizophrenia   . Bipolar disorder   . Diabetes mellitus   . Hypercholesteremia   . Hypertension   . Kidney stone     Past Surgical History  Procedure Date  . Cholecystectomy   . Tubal ligation     Family History: No family history on file.  Social History:  reports that she has been smoking.  She does not have any smokeless tobacco history on file. She reports that she does not drink alcohol or use illicit drugs.  Additional Social History:  Alcohol / Drug Use History of alcohol / drug use?: No history of alcohol / drug abuse Allergies:  Allergies  Allergen Reactions  . Haldol (Haloperidol Decanoate)     Home Medications:  Medications Prior to Admission  Medication Dose Route Frequency Provider Last Rate Last Dose  . benztropine (COGENTIN) tablet 0.5 mg  0.5 mg Oral BID [name], MD   0.5 mg at 06/19/11 1059  . benztropine (COGENTIN) tablet 1 mg  1 mg Oral BID [name], MD   1 mg  at 06/19/11 1059  . hydrochlorothiazide (HYDRODIURIL) tablet 25 mg  25 mg Oral q morning - 10a [name], MD   25 mg at 06/19/11 1100  . insulin aspart protamine-insulin aspart (NOVOLOG 70/30) injection 40 Units  40 Units Subcutaneous Q supper [name], MD   40 Units at 06/17/11 1807  . insulin aspart protamine-insulin aspart (NOVOLOG 70/30) injection 60 Units  60 Units Subcutaneous Q breakfast [name], MD   60 Units at 06/15/11 0856  . lisinopril (PRINIVIL,ZESTRIL) tablet 40 mg  40 mg Oral Daily [name], MD   40 mg at 06/19/11 1100  . lithium carbonate capsule 600 mg  600 mg Oral Q breakfast [name], MD   600 mg at 06/19/11 0910  . lithium carbonate capsule 900 mg  900 mg Oral QHS [name], MD   900 mg at 06/18/11 2145  . metFORMIN (GLUCOPHAGE-XR) 24 hr tablet 1,000 mg  1,000 mg Oral BID WC [name], MD   1,000 mg at 06/19/11 0908  . metoprolol (LOPRESSOR) 50 MG tablet           . metoprolol tartrate (LOPRESSOR) tablet 12.5 mg  12.5 mg Oral BID [name], MD   12.5 mg at 06/19/11 1102  . OLANZapine (ZYPREXA) tablet 10 mg  10 mg Oral Daily Iva Hildred Laser,  MD   10 mg at 06/19/11 1101  . OLANZapine (ZYPREXA) tablet 20 mg  20 mg Oral QHS [name], MD   20 mg at 06/18/11 2145  . Paliperidone Palmitate SUSP 117 mg  117 mg Intramuscular Once [name], MD      . simvastatin (ZOCOR) tablet 20 mg  20 mg Oral q1800 [name], MD   20 mg at 06/18/11 1848  . ziprasidone (GEODON) injection 20 mg  20 mg Intramuscular Once [name], MD   20 mg at 06/12/11 1812  . DISCONTD: acetaminophen (TYLENOL) tablet 650 mg  650 mg Oral Q4H PRN Hilario Quarry, MD      . DISCONTD: benztropine (COGENTIN) tablet 1 mg  1 mg Oral BID Hurman Horn, MD   1 mg at 06/09/11 2102  . DISCONTD: cholecalciferol (VITAMIN D) tablet 800 Units  800 Units Oral q morning - 10a Hurman Horn, MD   800 Units at 06/09/11 1035  . DISCONTD: hydrochlorothiazide (HYDRODIURIL) tablet 25 mg  25 mg Oral q morning - 10a  Hurman Horn, MD   25 mg at 06/09/11 1041  . DISCONTD: insulin aspart (novoLOG) injection 0-20 Units  0-20 Units Subcutaneous Q4H Hilario Quarry, MD   4 Units at 06/09/11 1756  . DISCONTD: insulin aspart protamine-insulin aspart (NOVOLOG 70/30) injection 40 Units  40 Units Subcutaneous Q supper Hurman Horn, MD   40 Units at 06/07/11 1711  . DISCONTD: insulin aspart protamine-insulin aspart (NOVOLOG 70/30) injection 60 Units  60 Units Subcutaneous Q breakfast Hurman Horn, MD   60 Units at 06/09/11 1034  . DISCONTD: lisinopril (PRINIVIL,ZESTRIL) tablet 40 mg  40 mg Oral Daily Hurman Horn, MD   40 mg at 06/09/11 1040  . DISCONTD: lithium carbonate capsule 300 mg  300 mg Oral BID WC Hurman Horn, MD   300 mg at 06/09/11 2101  . DISCONTD: LORazepam (ATIVAN) tablet 1 mg  1 mg Oral Q8H PRN Hilario Quarry, MD   1 mg at 06/06/11 1901  . DISCONTD: metFORMIN (GLUCOPHAGE-XR) 24 hr tablet 1,000 mg  1,000 mg Oral BID Hurman Horn, MD   1,000 mg at 06/09/11 2102  . DISCONTD: metoprolol tartrate (LOPRESSOR) tablet 12.5 mg  12.5 mg Oral BID Hurman Horn, MD   12.5 mg at 06/09/11 2102  . DISCONTD: OLANZapine (ZYPREXA) tablet 20 mg  20 mg Oral Daily Hurman Horn, MD   20 mg at 06/09/11 1036  . DISCONTD: omega-3 acid ethyl esters (LOVAZA) capsule 1 g  1 g Oral Daily Hurman Horn, MD   1 g at 06/09/11 1035  . DISCONTD: ondansetron (ZOFRAN) tablet 4 mg  4 mg Oral Q8H PRN Hilario Quarry, MD      . DISCONTD: simvastatin (ZOCOR) tablet 20 mg  20 mg Oral QPM Hurman Horn, MD   20 mg at 06/09/11 2102  . DISCONTD: zolpidem (AMBIEN) tablet 5 mg  5 mg Oral QHS PRN Hilario Quarry, MD       Medications Prior to Admission  Medication Sig Dispense Refill  . benztropine (COGENTIN) 0.5 MG tablet Take 0.5 mg by mouth 2 (two) times daily. Takes with 1mg  tablet twice daily to equal dose of 1.5mg       . benztropine (COGENTIN) 1 MG tablet Take 1 mg by mouth 2 (two) times daily. Takes with 0.5mg  tablet to equal 1.5mg  dose  twice daily      .  cholecalciferol (VITAMIN D) 400 UNITS TABS Take 800 Units by mouth every morning.      . hydrochlorothiazide (HYDRODIURIL) 25 MG tablet Take 25 mg by mouth every morning.      . insulin aspart protamine-insulin aspart (NOVOLOG 70/30) (70-30) 100 UNIT/ML injection Inject 40-60 Units into the skin 2 (two) times daily with a meal. 60 units in the morning and 40 units in the evening      . lisinopril (PRINIVIL,ZESTRIL) 40 MG tablet Take 40 mg by mouth daily.      Marland Kitchen lithium carbonate 300 MG capsule Take 600-900 mg by mouth 2 (two) times daily. 600mg  every morning (2 capsules) and 900mg  every evening (3 capsules).      . metformin (FORTAMET) 1000 MG (OSM) 24 hr tablet Take 1,000 mg by mouth 2 (two) times daily.      . metoprolol tartrate (LOPRESSOR) 25 MG tablet Take 12.5 mg by mouth 2 (two) times daily.      Marland Kitchen OLANZapine (ZYPREXA) 10 MG tablet Take 10-20 mg by mouth 2 (two) times daily. Takes 1 tablet in the AM and takes 2 tablets at bedtime      . Omega-3 Fatty Acids 500 MG CAPS Take 1 capsule by mouth 2 (two) times daily.      . Paliperidone Palmitate (INVEGA SUSTENNA IM) Inject 117 mg into the muscle every 30 (thirty) days. Due 06/14/2011      . simvastatin (ZOCOR) 20 MG tablet Take 20 mg by mouth every evening.        OB/GYN Status:  Patient's last menstrual period was 05/02/2011.  General Assessment Data Location of Assessment: [facility] ACT Assessment: Yes Living Arrangements: Children (adult children, per RN) Can pt return to current living arrangement?: Yes Admission Status: Involuntary Is patient capable of signing voluntary admission?: No Transfer from: Acute Hospital Referral Source: MD  Education Status Is patient currently in school?: No  Risk to self Suicidal Ideation: No Suicidal Intent: No Is patient at risk for suicide?: No Suicidal Plan?: No Access to Means: No What has been your use of drugs/alcohol within the last 12 months?: denies Previous  Attempts/Gestures: No How many times?: 1  Other Self Harm Risks: none Triggers for Past Attempts: Unknown Intentional Self Injurious Behavior: None Family Suicide History: No Recent stressful life event(s): Other (Comment) (pt continues to say only stressor is location of her childre) Persecutory voices/beliefs?: No Depression: No Depression Symptoms: Insomnia;Loss of interest in usual pleasures;Feeling angry/irritable Substance abuse history and/or treatment for substance abuse?: No Suicide prevention information given to non-admitted patients: Not applicable  Risk to Others Homicidal Ideation: No Thoughts of Harm to Others: No Comment - Thoughts of Harm to Others:  (Pt was found with butcher knife at [facility] on 4/7) Current Homicidal Intent: No Current Homicidal Plan: No Access to Homicidal Means: No Describe Access to Homicidal Means: had butcher knife recently at [employer] Identified Victim: unknown- History of harm to others?: Yes Assessment of Violence: In distant past Violent Behavior Description: fights Does patient have access to weapons?: No Criminal Charges Pending?: Yes Describe Pending Criminal Charges: pt reports charge for fighting from 06/2010 Does patient have a court date: Yes Court Date:  (May 2013)  Psychosis Hallucinations: None noted Delusions: Persecutory  Mental Status Report Appear/Hygiene: Other (Comment) (casual) Eye Contact: Good Motor Activity: Unremarkable Speech: Logical/coherent Level of Consciousness: Alert Mood: Other (Comment) (pleasant) Affect: Appropriate to circumstance Anxiety Level: Minimal Thought Processes: Relevant Judgement: Impaired Orientation: Person;Place;Time Obsessive Compulsive Thoughts/Behaviors: Minimal  Cognitive  Functioning Concentration: Decreased Memory: Recent Intact;Remote Intact IQ: Average Insight: Poor Impulse Control: Poor Appetite: Good Weight Loss: 20  (pt uncertain ) Weight Gain: 0  Sleep: No  Change Total Hours of Sleep: 4  Vegetative Symptoms: None  Prior Inpatient Therapy Prior Inpatient Therapy: Yes Prior Therapy Dates: last asdmission to [facility] for 6 months Prior Therapy Facilty/Provider(s): [facility] Reason for Treatment: psychotic  Prior Outpatient Therapy Prior Outpatient Therapy: Yes Prior Therapy Dates: 2012-2013 Prior Therapy Facilty/Provider([facility]): [name] Reason for Treatment: medications;ACTT Team          Abuse/Neglect Assessment (Assessment to be complete while patient is alone) Physical Abuse: Denies Verbal Abuse: Denies Sexual Abuse: Denies Exploitation of patient/patient's resources: Denies Self-Neglect: Denies Values / Beliefs Cultural Requests During Hospitalization: None Spiritual Requests During Hospitalization: None     Nutrition Screen Diet: Regular  Additional Information 1:1 In Past 12 Months?: Yes CIRT Risk: Yes Elopement Risk: Yes Does patient have medical clearance?: Yes     Disposition:New IVC forms completed and faxed to the [facility] of Court in order that a custody order can be completed. These forms will be returned to the ED later today by sheriff's deputy. [facility] and discussed previous 7 day authorization. No new authorization needed. [facility] will contact [facility] to change the authorization dates. Called [facility] and spoke with admissions, no beds available at this time. Dr. Adriana Simas notified of this. He is in agreement with the disposition plan.  Disposition Disposition of Patient: Inpatient treatment program Type of inpatient treatment program: Adult  On Site Evaluation by:   Reviewed with Physician:     Jearld Pies 06/19/2011 12:37 PM

## 2011-06-20 MED ORDER — BENZTROPINE MESYLATE 1 MG PO TABS
ORAL_TABLET | ORAL | Status: AC
  Filled 2011-06-20: qty 2

## 2011-06-20 NOTE — ED Notes (Signed)
Pt up to bathroom. Lunch tray given.

## 2011-06-20 NOTE — ED Notes (Signed)
Pt eating supper. Request old lunch tray be left in her room

## 2011-06-20 NOTE — ED Notes (Signed)
[facility]

## 2011-06-20 NOTE — ED Notes (Signed)
Pt's insulin held due to CBG 104 and pt not eating all of breakfast

## 2011-06-20 NOTE — ED Notes (Signed)
Pt lying in bed. Does not want to get up and eat. Keeps dozing off. Sitter at bedside

## 2011-06-20 NOTE — ED Notes (Signed)
Pt beginning to wake up more. States she would like to have some ginger ale to drink. Pt given ginger ale but did not drink . Dozing again

## 2011-06-20 NOTE — ED Notes (Signed)
Patient remains in bed with eyes closed. No distress noted or voiced. Sitter at side

## 2011-06-20 NOTE — BH Assessment (Signed)
Assessment Note   Emily Cordova is an 41 y.o. female. The patient remains in the [facility] ED awaiting a bed at [facility].  She remains psychotic. She remains on IVC. Today the patient is withdraw. She has remained in bed and appears to be sleeping. She has not left her room, she has not asked to bath, which is normal for her. Her sugars have been low for a period of time and meds were withheld.  Axis I: Chronic Paranoid Schizophrenia Axis II: Deferred Axis III:  Past Medical History  Diagnosis Date  . Schizophrenia   . Bipolar disorder   . Diabetes mellitus   . Hypercholesteremia   . Hypertension   . Kidney stone    Axis IV: other psychosocial or environmental problems, problems related to social environment and problems with access to health care services Axis V: 21-30 behavior considerably influenced by delusions or hallucinations OR serious impairment in judgment, communication OR inability to function in almost all areas  Past Medical History:  Past Medical History  Diagnosis Date  . Schizophrenia   . Bipolar disorder   . Diabetes mellitus   . Hypercholesteremia   . Hypertension   . Kidney stone     Past Surgical History  Procedure Date  . Cholecystectomy   . Tubal ligation     Family History: No family history on file.  Social History:  reports that she has been smoking.  She does not have any smokeless tobacco history on file. She reports that she does not drink alcohol or use illicit drugs.  Additional Social History:  Alcohol / Drug Use History of alcohol / drug use?: No history of alcohol / drug abuse Allergies:  Allergies  Allergen Reactions  . Haldol (Haloperidol Decanoate)     Home Medications:  Medications Prior to Admission  Medication Dose Route Frequency Provider Last Rate Last Dose  . benztropine (COGENTIN) tablet 0.5 mg  0.5 mg Oral BID [name], MD   0.5 mg at 06/19/11 2211  . benztropine (COGENTIN) tablet 1 mg  1 mg Oral BID [name], MD    1 mg at 06/19/11 2214  . hydrochlorothiazide (HYDRODIURIL) tablet 25 mg  25 mg Oral q morning - 10a [name], MD   25 mg at 06/19/11 1100  . insulin aspart protamine-insulin aspart (NOVOLOG 70/30) injection 40 Units  40 Units Subcutaneous Q supper [name], MD   40 Units at 06/17/11 1807  . insulin aspart protamine-insulin aspart (NOVOLOG 70/30) injection 60 Units  60 Units Subcutaneous Q breakfast [name], MD   60 Units at 06/15/11 0856  . lisinopril (PRINIVIL,ZESTRIL) tablet 40 mg  40 mg Oral Daily [name], MD   40 mg at 06/19/11 1100  . lithium carbonate capsule 600 mg  600 mg Oral Q breakfast [name], MD   600 mg at 06/19/11 0910  . lithium carbonate capsule 900 mg  900 mg Oral QHS [name], MD   900 mg at 06/19/11 2212  . metFORMIN (GLUCOPHAGE-XR) 24 hr tablet 1,000 mg  1,000 mg Oral BID WC [name], MD   1,000 mg at 06/19/11 1845  . metoprolol (LOPRESSOR) 50 MG tablet           . metoprolol tartrate (LOPRESSOR) tablet 12.5 mg  12.5 mg Oral BID [name], MD   12.5 mg at 06/19/11 2214  . OLANZapine (ZYPREXA) tablet 10 mg  10 mg Oral Daily  [name], MD   10 mg at 06/19/11 1101  . OLANZapine (ZYPREXA) tablet 20 mg  20 mg Oral QHS [name], MD   20 mg at 06/19/11 2213  . Paliperidone Palmitate SUSP 117 mg  117 mg Intramuscular Once [name], MD      . simvastatin (ZOCOR) tablet 20 mg  20 mg Oral q1800 [name], MD   20 mg at 06/19/11 1845  . ziprasidone (GEODON) injection 20 mg  20 mg Intramuscular Once [name], MD   20 mg at 06/12/11 1812  . DISCONTD: acetaminophen (TYLENOL) tablet 650 mg  650 mg Oral Q4H PRN Hilario Quarry, MD      . DISCONTD: benztropine (COGENTIN) tablet 1 mg  1 mg Oral BID Hurman Horn, MD   1 mg at 06/09/11 2102  . DISCONTD: cholecalciferol (VITAMIN D) tablet 800 Units  800 Units Oral q morning - 10a Hurman Horn, MD   800 Units at 06/09/11 1035  . DISCONTD: hydrochlorothiazide (HYDRODIURIL) tablet 25 mg  25 mg Oral q morning -  10a Hurman Horn, MD   25 mg at 06/09/11 1041  . DISCONTD: insulin aspart (novoLOG) injection 0-20 Units  0-20 Units Subcutaneous Q4H Hilario Quarry, MD   4 Units at 06/09/11 1756  . DISCONTD: insulin aspart protamine-insulin aspart (NOVOLOG 70/30) injection 40 Units  40 Units Subcutaneous Q supper Hurman Horn, MD   40 Units at 06/07/11 1711  . DISCONTD: insulin aspart protamine-insulin aspart (NOVOLOG 70/30) injection 60 Units  60 Units Subcutaneous Q breakfast Hurman Horn, MD   60 Units at 06/09/11 1034  . DISCONTD: lisinopril (PRINIVIL,ZESTRIL) tablet 40 mg  40 mg Oral Daily Hurman Horn, MD   40 mg at 06/09/11 1040  . DISCONTD: lithium carbonate capsule 300 mg  300 mg Oral BID WC Hurman Horn, MD   300 mg at 06/09/11 2101  . DISCONTD: LORazepam (ATIVAN) tablet 1 mg  1 mg Oral Q8H PRN Hilario Quarry, MD   1 mg at 06/06/11 1901  . DISCONTD: metFORMIN (GLUCOPHAGE-XR) 24 hr tablet 1,000 mg  1,000 mg Oral BID Hurman Horn, MD   1,000 mg at 06/09/11 2102  . DISCONTD: metoprolol tartrate (LOPRESSOR) tablet 12.5 mg  12.5 mg Oral BID Hurman Horn, MD   12.5 mg at 06/09/11 2102  . DISCONTD: OLANZapine (ZYPREXA) tablet 20 mg  20 mg Oral Daily Hurman Horn, MD   20 mg at 06/09/11 1036  . DISCONTD: omega-3 acid ethyl esters (LOVAZA) capsule 1 g  1 g Oral Daily Hurman Horn, MD   1 g at 06/09/11 1035  . DISCONTD: ondansetron (ZOFRAN) tablet 4 mg  4 mg Oral Q8H PRN Hilario Quarry, MD      . DISCONTD: simvastatin (ZOCOR) tablet 20 mg  20 mg Oral QPM Hurman Horn, MD   20 mg at 06/09/11 2102  . DISCONTD: zolpidem (AMBIEN) tablet 5 mg  5 mg Oral QHS PRN Hilario Quarry, MD       Medications Prior to Admission  Medication Sig Dispense Refill  . benztropine (COGENTIN) 0.5 MG tablet Take 0.5 mg by mouth 2 (two) times daily. Takes with 1mg  tablet twice daily to equal dose of 1.5mg       . benztropine (COGENTIN) 1 MG tablet Take 1 mg by mouth 2 (two) times daily. Takes with 0.5mg  tablet to equal 1.5mg  dose  twice daily      .  cholecalciferol (VITAMIN D) 400 UNITS TABS Take 800 Units by mouth every morning.      . hydrochlorothiazide (HYDRODIURIL) 25 MG tablet Take 25 mg by mouth every morning.      . insulin aspart protamine-insulin aspart (NOVOLOG 70/30) (70-30) 100 UNIT/ML injection Inject 40-60 Units into the skin 2 (two) times daily with a meal. 60 units in the morning and 40 units in the evening      . lisinopril (PRINIVIL,ZESTRIL) 40 MG tablet Take 40 mg by mouth daily.      Marland Kitchen lithium carbonate 300 MG capsule Take 600-900 mg by mouth 2 (two) times daily. 600mg  every morning (2 capsules) and 900mg  every evening (3 capsules).      . metformin (FORTAMET) 1000 MG (OSM) 24 hr tablet Take 1,000 mg by mouth 2 (two) times daily.      . metoprolol tartrate (LOPRESSOR) 25 MG tablet Take 12.5 mg by mouth 2 (two) times daily.      Marland Kitchen OLANZapine (ZYPREXA) 10 MG tablet Take 10-20 mg by mouth 2 (two) times daily. Takes 1 tablet in the AM and takes 2 tablets at bedtime      . Omega-3 Fatty Acids 500 MG CAPS Take 1 capsule by mouth 2 (two) times daily.      . Paliperidone Palmitate (INVEGA SUSTENNA IM) Inject 117 mg into the muscle every 30 (thirty) days. Due 06/14/2011      . simvastatin (ZOCOR) 20 MG tablet Take 20 mg by mouth every evening.        OB/GYN Status:  Patient's last menstrual period was 05/02/2011.  General Assessment Data Location of Assessment: [facility] ACT Assessment: Yes Living Arrangements: Children (adult children, per RN) Can pt return to current living arrangement?: Yes Admission Status: Involuntary Is patient capable of signing voluntary admission?: No Transfer from: Acute Hospital Referral Source: MD  Education Status Is patient currently in school?: No  Risk to self Suicidal Ideation: No Suicidal Intent: No Is patient at risk for suicide?: No Suicidal Plan?: No Access to Means: No What has been your use of drugs/alcohol within the last 12 months?: denies Previous  Attempts/Gestures: No How many times?: 1  Other Self Harm Risks: none Triggers for Past Attempts: Unknown Intentional Self Injurious Behavior: None Family Suicide History: No Recent stressful life event(s): Other (Comment) (pt continues to say only stressor is location of her childre) Persecutory voices/beliefs?: No Depression: No Depression Symptoms: Insomnia;Loss of interest in usual pleasures;Feeling angry/irritable Substance abuse history and/or treatment for substance abuse?: No Suicide prevention information given to non-admitted patients: Not applicable  Risk to Others Homicidal Ideation: No Thoughts of Harm to Others: No Comment - Thoughts of Harm to Others:  (Pt was found with butcher knife at [facility] on 4/7) Current Homicidal Intent: No Current Homicidal Plan: No Access to Homicidal Means: No Describe Access to Homicidal Means: had butcher knife recently at [employer] Identified Victim: unknown- History of harm to others?: Yes Assessment of Violence: In distant past Violent Behavior Description: fights Does patient have access to weapons?: No Criminal Charges Pending?: Yes Describe Pending Criminal Charges: pt reports charge for fighting from 06/2010 Does patient have a court date: Yes Court Date:  (May 2013)  Psychosis Hallucinations: None noted Delusions: Persecutory  Mental Status Report Appear/Hygiene: Other (Comment) (casual) Eye Contact: Good Motor Activity: Unremarkable Speech: Logical/coherent Level of Consciousness: Alert Mood: Other (Comment) (pleasant) Affect: Appropriate to circumstance Anxiety Level: Minimal Thought Processes: Relevant Judgement: Impaired Orientation: Person;Place;Time Obsessive Compulsive Thoughts/Behaviors: Minimal  Cognitive  Functioning Concentration: Decreased Memory: Recent Intact;Remote Intact IQ: Average Insight: Poor Impulse Control: Poor Appetite: Good Weight Loss: 20  (pt uncertain ) Weight Gain: 0  Sleep: No  Change Total Hours of Sleep: 4  Vegetative Symptoms: None  Prior Inpatient Therapy Prior Inpatient Therapy: Yes Prior Therapy Dates: last asdmission to [facility] for 6 months Prior Therapy Facilty/Provider(s): [facility] Reason for Treatment: psychotic  Prior Outpatient Therapy Prior Outpatient Therapy: Yes Prior Therapy Dates: 2012-2013 Prior Therapy Facilty/Provider([facility]): [name] Reason for Treatment: medications;ACTT Team          Abuse/Neglect Assessment (Assessment to be complete while patient is alone) Physical Abuse: Denies Verbal Abuse: Denies Sexual Abuse: Denies Exploitation of patient/patient's resources: Denies Self-Neglect: Denies Values / Beliefs Cultural Requests During Hospitalization: None Spiritual Requests During Hospitalization: None     Nutrition Screen Diet: Regular  Additional Information 1:1 In Past 12 Months?: Yes CIRT Risk: Yes Elopement Risk: Yes Does patient have medical clearance?: Yes     Disposition: The patient is still medically clear for discharge to [facility] when a bed is available. Spoke with Junious Dresser in admitting at [facility], patient is at the top of wait list but there are no discharges at this time. [facility] staff will call when bed is available. Dr Estell Harpin is in agreement with this plan. Disposition Disposition of Patient: Inpatient treatment program Type of inpatient treatment program: Adult  On Site Evaluation by:   Reviewed with Physician:     Jake Shark [facility] 06/20/2011 11:25 AM

## 2011-06-20 NOTE — ED Provider Notes (Signed)
6:19 AM Filed Vitals:   06/19/11 0742  BP: 116/61  Pulse: 61  Temp: 98.2 F (36.8 C)  Resp: 20   6:36 AM The patient is under involuntary commitment at this time.  The patient has been in the ER for approximately 9 days.  I've asked for the psychiatrist to evaluate the patient in the emergency department for stability as well as for any medication recommendations.  Patient denies homicidal or suicidal thoughts at this time.  She appears oriented.  She is a diabetic and she is being medicated with her home medications and we are checking twice daily but sugars.  She is without complaint at this time.   [facility]  Lyanne Co, MD 06/20/11 [phone]

## 2011-06-20 NOTE — ED Notes (Signed)
Nurse offered pt to have shower and wash her hair. Pt got almost to shower and changed her mind. States she will wash off in her room.

## 2011-06-20 NOTE — ED Notes (Signed)
Pt ate about 50% of dinner tray. Sitting in chair sleeping now. Pt has been quiet and cooperative all day. Sitter remains at bedside

## 2011-06-20 NOTE — ED Notes (Signed)
Pt sitting in chair. Snack of peanut butter and crackers given to pt. Sitter remains with pt

## 2011-06-20 NOTE — ED Notes (Signed)
Patient resting quietly and no distress noted or voiced.

## 2011-06-21 LAB — GLUCOSE, CAPILLARY: Glucose-Capillary: 74 mg/dL (ref 70–99)

## 2011-06-21 NOTE — ED Notes (Signed)
Patient resting with eyes closed.  Lying in bed on stomach.

## 2011-06-21 NOTE — ED Notes (Signed)
Patient lying in bed with eyes closed.  Respirations even and unlabored; equal rise and fall of chest noted. 

## 2011-06-21 NOTE — ED Notes (Signed)
Pt accepted to [facility]. Pt sleeping at present. Awaiting [place] [employer] to come and transport patient.

## 2011-06-21 NOTE — ED Notes (Signed)
Pt sleeping sitting up in chair. No complaints at present. Denies suicidal or homicidal thoughts.

## 2011-06-21 NOTE — ED Notes (Signed)
Patient resting comfortably in bed on stomach with eyes closed.  Respirations even and unlabored.

## 2011-06-21 NOTE — ED Notes (Signed)
Patient resting comfortably in bed with eyes closed.  Respirations even and unlabored.   

## 2011-06-21 NOTE — BH Assessment (Addendum)
Assessment Note   Emily Cordova is an 41 y.o. female. Patient remains in the [facility] ED awaiting a bed at [facility]. She remains delusional with paranoid thoughts. She has withdrawn to her room for the most part. She requested that her old lunch tray remain in her room when meals were served last night. She remains IVC.  Axis I: Chronic Paranoid Schizophrenia Axis II: Deferred Axis III:  Past Medical History  Diagnosis Date  . Schizophrenia   . Bipolar disorder   . Diabetes mellitus   . Hypercholesteremia   . Hypertension   . Kidney stone    Axis IV: other psychosocial or environmental problems, problems related to legal system/crime, problems related to social environment and problems with access to health care services Axis V: 11-20 some danger of hurting self or others possible OR occasionally fails to maintain minimal personal hygiene OR gross impairment in communication  Past Medical History:  Past Medical History  Diagnosis Date  . Schizophrenia   . Bipolar disorder   . Diabetes mellitus   . Hypercholesteremia   . Hypertension   . Kidney stone     Past Surgical History  Procedure Date  . Cholecystectomy   . Tubal ligation     Family History: No family history on file.  Social History:  reports that she has been smoking.  She does not have any smokeless tobacco history on file. She reports that she does not drink alcohol or use illicit drugs.  Additional Social History:  Alcohol / Drug Use History of alcohol / drug use?: No history of alcohol / drug abuse Allergies:  Allergies  Allergen Reactions  . Haldol (Haloperidol Decanoate)     Home Medications:  Medications Prior to Admission  Medication Dose Route Frequency Provider Last Rate Last Dose  . benztropine (COGENTIN) tablet 0.5 mg  0.5 mg Oral BID [name], MD   0.5 mg at 06/21/11 1002  . benztropine (COGENTIN) tablet 1 mg  1 mg Oral BID [name], MD   1 mg at 06/21/11 0956  . hydrochlorothiazide  (HYDRODIURIL) tablet 25 mg  25 mg Oral q morning - 10a [name], MD   25 mg at 06/21/11 0956  . insulin aspart protamine-insulin aspart (NOVOLOG 70/30) injection 40 Units  40 Units Subcutaneous Q supper [name], MD   40 Units at 06/20/11 1757  . insulin aspart protamine-insulin aspart (NOVOLOG 70/30) injection 60 Units  60 Units Subcutaneous Q breakfast [name], MD   60 Units at 06/21/11 0806  . lisinopril (PRINIVIL,ZESTRIL) tablet 40 mg  40 mg Oral Daily [name], MD   40 mg at 06/21/11 0954  . lithium carbonate capsule 600 mg  600 mg Oral Q breakfast [name], MD   600 mg at 06/21/11 0807  . lithium carbonate capsule 900 mg  900 mg Oral QHS [name], MD   900 mg at 06/20/11 2253  . metFORMIN (GLUCOPHAGE-XR) 24 hr tablet 1,000 mg  1,000 mg Oral BID WC [name], MD   1,000 mg at 06/21/11 0807  . metoprolol (LOPRESSOR) 50 MG tablet           . metoprolol tartrate (LOPRESSOR) tablet 12.5 mg  12.5 mg Oral BID [name], MD   12.5 mg at 06/21/11 0952  . OLANZapine (ZYPREXA) tablet 10 mg  10 mg Oral Daily [name], MD   10 mg at 06/21/11 0957  . OLANZapine (  ZYPREXA) tablet 20 mg  20 mg Oral QHS [name], MD   20 mg at 06/20/11 2256  . Paliperidone Palmitate SUSP 117 mg  117 mg Intramuscular Once [name], MD      . simvastatin (ZOCOR) tablet 20 mg  20 mg Oral q1800 [name], MD   20 mg at 06/20/11 1757  . ziprasidone (GEODON) injection 20 mg  20 mg Intramuscular Once [name], MD   20 mg at 06/12/11 1812  . DISCONTD: acetaminophen (TYLENOL) tablet 650 mg  650 mg Oral Q4H PRN Hilario Quarry, MD      . DISCONTD: benztropine (COGENTIN) tablet 1 mg  1 mg Oral BID Hurman Horn, MD   1 mg at 06/09/11 2102  . DISCONTD: cholecalciferol (VITAMIN D) tablet 800 Units  800 Units Oral q morning - 10a Hurman Horn, MD   800 Units at 06/09/11 1035  . DISCONTD: hydrochlorothiazide (HYDRODIURIL) tablet 25 mg  25 mg Oral q morning - 10a Hurman Horn, MD   25 mg at 06/09/11 1041    . DISCONTD: insulin aspart (novoLOG) injection 0-20 Units  0-20 Units Subcutaneous Q4H Hilario Quarry, MD   4 Units at 06/09/11 1756  . DISCONTD: insulin aspart protamine-insulin aspart (NOVOLOG 70/30) injection 40 Units  40 Units Subcutaneous Q supper Hurman Horn, MD   40 Units at 06/07/11 1711  . DISCONTD: insulin aspart protamine-insulin aspart (NOVOLOG 70/30) injection 60 Units  60 Units Subcutaneous Q breakfast Hurman Horn, MD   60 Units at 06/09/11 1034  . DISCONTD: lisinopril (PRINIVIL,ZESTRIL) tablet 40 mg  40 mg Oral Daily Hurman Horn, MD   40 mg at 06/09/11 1040  . DISCONTD: lithium carbonate capsule 300 mg  300 mg Oral BID WC Hurman Horn, MD   300 mg at 06/09/11 2101  . DISCONTD: LORazepam (ATIVAN) tablet 1 mg  1 mg Oral Q8H PRN Hilario Quarry, MD   1 mg at 06/06/11 1901  . DISCONTD: metFORMIN (GLUCOPHAGE-XR) 24 hr tablet 1,000 mg  1,000 mg Oral BID Hurman Horn, MD   1,000 mg at 06/09/11 2102  . DISCONTD: metoprolol tartrate (LOPRESSOR) tablet 12.5 mg  12.5 mg Oral BID Hurman Horn, MD   12.5 mg at 06/09/11 2102  . DISCONTD: OLANZapine (ZYPREXA) tablet 20 mg  20 mg Oral Daily Hurman Horn, MD   20 mg at 06/09/11 1036  . DISCONTD: omega-3 acid ethyl esters (LOVAZA) capsule 1 g  1 g Oral Daily Hurman Horn, MD   1 g at 06/09/11 1035  . DISCONTD: ondansetron (ZOFRAN) tablet 4 mg  4 mg Oral Q8H PRN Hilario Quarry, MD      . DISCONTD: simvastatin (ZOCOR) tablet 20 mg  20 mg Oral QPM Hurman Horn, MD   20 mg at 06/09/11 2102  . DISCONTD: zolpidem (AMBIEN) tablet 5 mg  5 mg Oral QHS PRN Hilario Quarry, MD       Medications Prior to Admission  Medication Sig Dispense Refill  . benztropine (COGENTIN) 0.5 MG tablet Take 0.5 mg by mouth 2 (two) times daily. Takes with 1mg  tablet twice daily to equal dose of 1.5mg       . benztropine (COGENTIN) 1 MG tablet Take 1 mg by mouth 2 (two) times daily. Takes with 0.5mg  tablet to equal 1.5mg  dose twice daily      . cholecalciferol (VITAMIN D)  400 UNITS TABS Take 800 Units  by mouth every morning.      . hydrochlorothiazide (HYDRODIURIL) 25 MG tablet Take 25 mg by mouth every morning.      . insulin aspart protamine-insulin aspart (NOVOLOG 70/30) (70-30) 100 UNIT/ML injection Inject 40-60 Units into the skin 2 (two) times daily with a meal. 60 units in the morning and 40 units in the evening      . lisinopril (PRINIVIL,ZESTRIL) 40 MG tablet Take 40 mg by mouth daily.      Marland Kitchen lithium carbonate 300 MG capsule Take 600-900 mg by mouth 2 (two) times daily. 600mg  every morning (2 capsules) and 900mg  every evening (3 capsules).      . metformin (FORTAMET) 1000 MG (OSM) 24 hr tablet Take 1,000 mg by mouth 2 (two) times daily.      . metoprolol tartrate (LOPRESSOR) 25 MG tablet Take 12.5 mg by mouth 2 (two) times daily.      Marland Kitchen OLANZapine (ZYPREXA) 10 MG tablet Take 10-20 mg by mouth 2 (two) times daily. Takes 1 tablet in the AM and takes 2 tablets at bedtime      . Omega-3 Fatty Acids 500 MG CAPS Take 1 capsule by mouth 2 (two) times daily.      . Paliperidone Palmitate (INVEGA SUSTENNA IM) Inject 117 mg into the muscle every 30 (thirty) days. Due 06/14/2011      . simvastatin (ZOCOR) 20 MG tablet Take 20 mg by mouth every evening.        OB/GYN Status:  Patient's last menstrual period was 05/02/2011.  General Assessment Data Location of Assessment: [facility] ACT Assessment: Yes Living Arrangements: Children (adult children, per RN) Can pt return to current living arrangement?: Yes Admission Status: Involuntary Is patient capable of signing voluntary admission?: No Transfer from: Acute Hospital Referral Source: MD  Education Status Is patient currently in school?: No  Risk to self Suicidal Ideation: No Suicidal Intent: No Is patient at risk for suicide?: No Suicidal Plan?: No Access to Means: No What has been your use of drugs/alcohol within the last 12 months?: denies Previous Attempts/Gestures: No How many times?: 1  Other Self  Harm Risks: none Triggers for Past Attempts: Unknown Intentional Self Injurious Behavior: None Family Suicide History: No Recent stressful life event(s): Other (Comment) (pt continues to say only stressor is location of her childre) Persecutory voices/beliefs?: No Depression: No Depression Symptoms: Insomnia;Loss of interest in usual pleasures;Feeling angry/irritable Substance abuse history and/or treatment for substance abuse?: No Suicide prevention information given to non-admitted patients: Not applicable  Risk to Others Homicidal Ideation: No Thoughts of Harm to Others: No Comment - Thoughts of Harm to Others:  (Pt was found with butcher knife at [facility] on 4/7) Current Homicidal Intent: No Current Homicidal Plan: No Access to Homicidal Means: No Describe Access to Homicidal Means: had butcher knife recently at [employer] Identified Victim: unknown- History of harm to others?: Yes Assessment of Violence: In distant past Violent Behavior Description: fights Does patient have access to weapons?: No Criminal Charges Pending?: Yes Describe Pending Criminal Charges: pt reports charge for fighting from 06/2010 Does patient have a court date: Yes Court Date:  (May 2013)  Psychosis Hallucinations: None noted Delusions: Persecutory  Mental Status Report Appear/Hygiene: Other (Comment) (casual) Eye Contact: Good Motor Activity: Unremarkable Speech: Logical/coherent Level of Consciousness: Alert Mood: Other (Comment) (pleasant) Affect: Appropriate to circumstance Anxiety Level: Minimal Thought Processes: Relevant Judgement: Impaired Orientation: Person;Place;Time Obsessive Compulsive Thoughts/Behaviors: Minimal  Cognitive Functioning Concentration: Decreased Memory: Recent Intact;Remote Intact IQ: Average  Insight: Poor Impulse Control: Poor Appetite: Good Weight Loss: 20  (pt uncertain ) Weight Gain: 0  Sleep: No Change Total Hours of Sleep: 4  Vegetative Symptoms:  None  Prior Inpatient Therapy Prior Inpatient Therapy: Yes Prior Therapy Dates: last asdmission to [facility] for 6 months Prior Therapy Facilty/Provider(s): [facility] Reason for Treatment: psychotic  Prior Outpatient Therapy Prior Outpatient Therapy: Yes Prior Therapy Dates: 2012-2013 Prior Therapy Facilty/Provider(s): [name] Reason for Treatment: medications;ACTT Team          Abuse/Neglect Assessment (Assessment to be complete while patient is alone) Physical Abuse: Denies Verbal Abuse: Denies Sexual Abuse: Denies Exploitation of patient/patient's resources: Denies Self-Neglect: Denies Values / Beliefs Cultural Requests During Hospitalization: None Spiritual Requests During Hospitalization: None     Nutrition Screen Diet: Regular  Additional Information 1:1 In Past 12 Months?: Yes CIRT Risk: Yes Elopement Risk: Yes Does patient have medical clearance?: Yes     Disposition: Called [facility] and spoke with [facility] in admitting. She informed Clinical research associate that Ms. Forgey remains on the wait list. She added that there were no beds available today at this time. She will call if a bed opens up. Information relayed to Dr Dione Booze. He is in agreement with this plan.Contacted by Junious Dresser  Of admitting at [facility]. She is requesting medications given by ED staff to [facility] for the last 3 days. Information gathered. Also enclosed copies of  Current IVC paper work. Current vitals were also included. Later received conformation that [facility] will now accept Ms. Schweigert. [facility] called to transport. Dr Adriana Simas in agreement with the disposition.  Disposition Disposition of Patient: Inpatient treatment program Type of inpatient treatment program: Adult  On Site Evaluation by:   Reviewed with Physician:     Jake Sh[facility] 06/21/2011 10:05 AM

## 2011-10-22 ENCOUNTER — Emergency Department (HOSPITAL_COMMUNITY): Payer: Medicaid Other

## 2011-10-22 ENCOUNTER — Inpatient Hospital Stay (HOSPITAL_COMMUNITY)
Admission: EM | Admit: 2011-10-22 | Discharge: 2011-11-02 | DRG: 917 | Disposition: A | Discharge status: Home / Self Care | Payer: Medicaid Other | Attending: Internal Medicine | Admitting: Internal Medicine

## 2011-10-22 ENCOUNTER — Encounter (HOSPITAL_COMMUNITY): Payer: Self-pay | Admitting: Emergency Medicine

## 2011-10-22 DIAGNOSIS — E7800 Pure hypercholesterolemia, unspecified: Secondary | ICD-10-CM | POA: Diagnosis present

## 2011-10-22 DIAGNOSIS — T56891A Toxic effect of other metals, accidental (unintentional), initial encounter: Secondary | ICD-10-CM | POA: Diagnosis present

## 2011-10-22 DIAGNOSIS — E1165 Type 2 diabetes mellitus with hyperglycemia: Secondary | ICD-10-CM | POA: Diagnosis present

## 2011-10-22 DIAGNOSIS — F209 Schizophrenia, unspecified: Secondary | ICD-10-CM | POA: Diagnosis present

## 2011-10-22 DIAGNOSIS — T438X4A Poisoning by other psychotropic drugs, undetermined, initial encounter: Principal | ICD-10-CM | POA: Diagnosis present

## 2011-10-22 DIAGNOSIS — I10 Essential (primary) hypertension: Secondary | ICD-10-CM | POA: Diagnosis present

## 2011-10-22 DIAGNOSIS — E860 Dehydration: Secondary | ICD-10-CM | POA: Diagnosis present

## 2011-10-22 DIAGNOSIS — Z79899 Other long term (current) drug therapy: Secondary | ICD-10-CM

## 2011-10-22 DIAGNOSIS — F29 Unspecified psychosis not due to a substance or known physiological condition: Secondary | ICD-10-CM

## 2011-10-22 DIAGNOSIS — F319 Bipolar disorder, unspecified: Secondary | ICD-10-CM | POA: Diagnosis present

## 2011-10-22 DIAGNOSIS — R4182 Altered mental status, unspecified: Secondary | ICD-10-CM | POA: Diagnosis present

## 2011-10-22 DIAGNOSIS — R0902 Hypoxemia: Secondary | ICD-10-CM | POA: Diagnosis present

## 2011-10-22 DIAGNOSIS — T438X1A Poisoning by other psychotropic drugs, accidental (unintentional), initial encounter: Principal | ICD-10-CM | POA: Diagnosis present

## 2011-10-22 DIAGNOSIS — F17200 Nicotine dependence, unspecified, uncomplicated: Secondary | ICD-10-CM | POA: Diagnosis present

## 2011-10-22 DIAGNOSIS — Z2239 Carrier of other specified bacterial diseases: Secondary | ICD-10-CM

## 2011-10-22 DIAGNOSIS — IMO0002 Reserved for concepts with insufficient information to code with codable children: Secondary | ICD-10-CM | POA: Diagnosis present

## 2011-10-22 DIAGNOSIS — Z794 Long term (current) use of insulin: Secondary | ICD-10-CM

## 2011-10-22 DIAGNOSIS — I959 Hypotension, unspecified: Secondary | ICD-10-CM | POA: Diagnosis present

## 2011-10-22 DIAGNOSIS — T43591A Poisoning by other antipsychotics and neuroleptics, accidental (unintentional), initial encounter: Secondary | ICD-10-CM | POA: Diagnosis present

## 2011-10-22 DIAGNOSIS — J9600 Acute respiratory failure, unspecified whether with hypoxia or hypercapnia: Secondary | ICD-10-CM | POA: Diagnosis not present

## 2011-10-22 DIAGNOSIS — E669 Obesity, unspecified: Secondary | ICD-10-CM | POA: Diagnosis present

## 2011-10-22 DIAGNOSIS — J9601 Acute respiratory failure with hypoxia: Secondary | ICD-10-CM

## 2011-10-22 DIAGNOSIS — IMO0001 Reserved for inherently not codable concepts without codable children: Secondary | ICD-10-CM | POA: Diagnosis present

## 2011-10-22 DIAGNOSIS — N289 Disorder of kidney and ureter, unspecified: Secondary | ICD-10-CM | POA: Diagnosis present

## 2011-10-22 DIAGNOSIS — R197 Diarrhea, unspecified: Secondary | ICD-10-CM

## 2011-10-22 LAB — GLUCOSE, CAPILLARY: Glucose-Capillary: 181 mg/dL — ABNORMAL HIGH (ref 70–99)

## 2011-10-22 LAB — BASIC METABOLIC PANEL
BUN: 31 mg/dL — ABNORMAL HIGH (ref 6–23)
CO2: 23 mEq/L (ref 19–32)
Chloride: 102 mEq/L (ref 96–112)
Creatinine, Ser: 1.79 mg/dL — ABNORMAL HIGH (ref 0.50–1.10)
Glucose, Bld: 179 mg/dL — ABNORMAL HIGH (ref 70–99)

## 2011-10-22 LAB — CBC WITH DIFFERENTIAL/PLATELET
HCT: 37.5 % (ref 36.0–46.0)
Hemoglobin: 12.6 g/dL (ref 12.0–15.0)
Lymphocytes Relative: 13 % (ref 12–46)
Lymphs Abs: 1.6 10*3/uL (ref 0.7–4.0)
MCV: 91.7 fL (ref 78.0–100.0)
Monocytes Absolute: 0.6 10*3/uL (ref 0.1–1.0)
Monocytes Relative: 5 % (ref 3–12)
Neutro Abs: 10.1 10*3/uL — ABNORMAL HIGH (ref 1.7–7.7)
WBC: 12.9 10*3/uL — ABNORMAL HIGH (ref 4.0–10.5)

## 2011-10-22 LAB — URINE MICROSCOPIC-ADD ON

## 2011-10-22 LAB — URINALYSIS, ROUTINE W REFLEX MICROSCOPIC
Glucose, UA: NEGATIVE mg/dL
Hgb urine dipstick: NEGATIVE
Specific Gravity, Urine: >1.03 — ABNORMAL HIGH (ref 1.005–1.030)

## 2011-10-22 LAB — RAPID URINE DRUG SCREEN, HOSP PERFORMED
Amphetamines: NOT DETECTED
Benzodiazepines: NOT DETECTED
Opiates: NOT DETECTED

## 2011-10-22 LAB — LITHIUM LEVEL
Lithium Lvl: 4.08 mEq/L (ref 0.80–1.40)
Lithium Lvl: 3.92 mEq/L (ref 0.80–1.40)

## 2011-10-22 LAB — ETHANOL: Alcohol, Ethyl (B): <11 mg/dL (ref 0–11)

## 2011-10-22 LAB — POCT I-STAT 3, ART BLOOD GAS (G3+)
pCO2 arterial: 38.3 mmHg (ref 35.0–45.0)
pH, Arterial: 7.36 (ref 7.350–7.450)

## 2011-10-22 LAB — HEPATIC FUNCTION PANEL
ALT: 23 U/L (ref 0–35)
AST: 14 U/L (ref 0–37)
Bilirubin, Direct: 0.1 mg/dL (ref 0.0–0.3)

## 2011-10-22 MED ORDER — SODIUM CHLORIDE 0.9 % IV SOLN
1000.0000 mL | INTRAVENOUS | Status: DC
  Administered 2011-10-24: 1000 mL via INTRAVENOUS

## 2011-10-22 MED ORDER — METFORMIN HCL ER 500 MG PO TB24
1000.0000 mg | ORAL_TABLET | Freq: Two times a day (BID) | ORAL | Status: DC
  Filled 2011-10-22 (×5): qty 2

## 2011-10-22 MED ORDER — INSULIN ASPART PROT & ASPART (70-30 MIX) 100 UNIT/ML ~~LOC~~ SUSP
40.0000 [IU] | Freq: Two times a day (BID) | SUBCUTANEOUS | Status: DC
  Filled 2011-10-22: qty 3

## 2011-10-22 MED ORDER — IBUPROFEN 600 MG PO TABS
600.0000 mg | ORAL_TABLET | Freq: Three times a day (TID) | ORAL | Status: DC | PRN
  Filled 2011-10-22: qty 1

## 2011-10-22 MED ORDER — ASPIRIN 81 MG PO CHEW
324.0000 mg | CHEWABLE_TABLET | ORAL | Status: AC
  Administered 2011-10-23: 324 mg via ORAL
  Filled 2011-10-22: qty 4

## 2011-10-22 MED ORDER — BENZTROPINE MESYLATE 1 MG PO TABS: 1.0000 mg | ORAL_TABLET | Freq: Two times a day (BID) | ORAL | Status: DC

## 2011-10-22 MED ORDER — ZOLPIDEM TARTRATE 5 MG PO TABS: 10.0000 mg | ORAL_TABLET | Freq: Every evening | ORAL | Status: DC | PRN

## 2011-10-22 MED ORDER — BENZTROPINE MESYLATE 0.5 MG PO TABS: 0.5000 mg | ORAL_TABLET | Freq: Two times a day (BID) | ORAL | Status: DC

## 2011-10-22 MED ORDER — METFORMIN HCL ER 500 MG PO TB24
ORAL_TABLET | ORAL | Status: AC
  Filled 2011-10-22: qty 2

## 2011-10-22 MED ORDER — LITHIUM CARBONATE 300 MG PO CAPS
600.0000 mg | ORAL_CAPSULE | Freq: Every morning | ORAL | Status: DC
  Filled 2011-10-22 (×2): qty 2

## 2011-10-22 MED ORDER — ASPIRIN 300 MG RE SUPP
300.0000 mg | RECTAL | Status: AC
  Filled 2011-10-22: qty 1

## 2011-10-22 MED ORDER — OLANZAPINE 10 MG PO TABS
10.0000 mg | ORAL_TABLET | Freq: Every morning | ORAL | Status: DC
  Filled 2011-10-22 (×2): qty 1

## 2011-10-22 MED ORDER — LISINOPRIL 40 MG PO TABS
40.0000 mg | ORAL_TABLET | Freq: Every day | ORAL | Status: DC
  Filled 2011-10-22 (×3): qty 1

## 2011-10-22 MED ORDER — LORAZEPAM 1 MG PO TABS: 1.0000 mg | ORAL_TABLET | Freq: Three times a day (TID) | ORAL | Status: DC | PRN

## 2011-10-22 MED ORDER — ONDANSETRON HCL 4 MG PO TABS: 4.0000 mg | ORAL_TABLET | Freq: Three times a day (TID) | ORAL | Status: DC | PRN

## 2011-10-22 MED ORDER — SIMVASTATIN 20 MG PO TABS
20.0000 mg | ORAL_TABLET | Freq: Every evening | ORAL | Status: DC
  Filled 2011-10-22 (×2): qty 1

## 2011-10-22 MED ORDER — INSULIN NPH (HUMAN) (ISOPHANE) 100 UNIT/ML ~~LOC~~ SUSP
60.0000 [IU] | Freq: Every day | SUBCUTANEOUS | Status: DC
  Filled 2011-10-22: qty 10

## 2011-10-22 MED ORDER — LITHIUM CARBONATE ER 450 MG PO TBCR: 900.0000 mg | EXTENDED_RELEASE_TABLET | Freq: Every evening | ORAL | Status: DC

## 2011-10-22 MED ORDER — ALUM & MAG HYDROXIDE-SIMETH 200-200-20 MG/5ML PO SUSP
30.0000 mL | ORAL | Status: DC | PRN
  Filled 2011-10-22: qty 30

## 2011-10-22 MED ORDER — HEPARIN SODIUM (PORCINE) 5000 UNIT/ML IJ SOLN
5000.0000 [IU] | Freq: Three times a day (TID) | INTRAMUSCULAR | Status: DC
  Administered 2011-10-23 – 2011-11-02 (×32): 5000 [IU] via SUBCUTANEOUS
  Filled 2011-10-22 (×37): qty 1

## 2011-10-22 MED ORDER — METOPROLOL TARTRATE 12.5 MG HALF TABLET
12.5000 mg | ORAL_TABLET | Freq: Two times a day (BID) | ORAL | Status: DC
  Filled 2011-10-22 (×5): qty 1

## 2011-10-22 MED ORDER — SODIUM CHLORIDE 0.9 % IV SOLN
1000.0000 mL | Freq: Once | INTRAVENOUS | Status: AC
  Administered 2011-10-22: 1000 mL via INTRAVENOUS

## 2011-10-22 MED ORDER — ACETAMINOPHEN 325 MG PO TABS
650.0000 mg | ORAL_TABLET | ORAL | Status: DC | PRN
  Administered 2011-10-24 (×2): 650 mg via ORAL
  Filled 2011-10-22: qty 2

## 2011-10-22 MED ORDER — OLANZAPINE 10 MG PO TABS
20.0000 mg | ORAL_TABLET | Freq: Every day | ORAL | Status: DC
  Filled 2011-10-22 (×2): qty 2

## 2011-10-22 MED ORDER — LOPERAMIDE HCL 2 MG PO CAPS
4.0000 mg | ORAL_CAPSULE | Freq: Once | ORAL | Status: AC
  Administered 2011-10-22: 4 mg via ORAL
  Filled 2011-10-22: qty 2

## 2011-10-22 MED ORDER — NICOTINE 21 MG/24HR TD PT24
21.0000 mg | MEDICATED_PATCH | Freq: Every day | TRANSDERMAL | Status: DC
  Administered 2011-10-22: 21 mg via TRANSDERMAL
  Filled 2011-10-22: qty 1

## 2011-10-22 MED ORDER — HYDROCHLOROTHIAZIDE 25 MG PO TABS
25.0000 mg | ORAL_TABLET | Freq: Every morning | ORAL | Status: DC
  Filled 2011-10-22 (×3): qty 1

## 2011-10-22 MED ORDER — SODIUM CHLORIDE 0.9 % IV SOLN
INTRAVENOUS | Status: DC
  Administered 2011-10-22 – 2011-10-24 (×4): via INTRAVENOUS

## 2011-10-22 MED ORDER — OLANZAPINE 10 MG PO TABS
10.0000 mg | ORAL_TABLET | Freq: Two times a day (BID) | ORAL | Status: DC
  Filled 2011-10-22 (×5): qty 2

## 2011-10-22 MED ORDER — INSULIN NPH (HUMAN) (ISOPHANE) 100 UNIT/ML ~~LOC~~ SUSP: 35.0000 [IU] | Freq: Every day | SUBCUTANEOUS | Status: DC

## 2011-10-22 MED ORDER — SODIUM CHLORIDE 0.9 % IV SOLN: 250.0000 mL | INTRAVENOUS | Status: DC | PRN

## 2011-10-22 MED ORDER — LITHIUM CARBONATE 300 MG PO CAPS
600.0000 mg | ORAL_CAPSULE | Freq: Two times a day (BID) | ORAL | Status: DC
  Filled 2011-10-22 (×5): qty 3

## 2011-10-22 NOTE — ED Notes (Signed)
MD informed of pts increased temp

## 2011-10-22 NOTE — ED Notes (Signed)
Pt repositioned in bed. Daughter has stepped out for lunch. Pt was able to set up in bed to aid in scooting back. VSS.

## 2011-10-22 NOTE — ED Notes (Signed)
Pt medicated. Pt unable to carry conversation. Pt stating, "Do you remember me?" then, begins laughing, followed by closing her eyes to rest. Unable to answer questions appropriately.

## 2011-10-22 NOTE — ED Notes (Signed)
Dr. Ignacia Palma made aware that clarified med list has been entered by Pharm. Tech after speaking w/[employer] rep.  He will review.

## 2011-10-22 NOTE — ED Notes (Signed)
Pt still sleeping. VSS. Daughter remains at bedside.

## 2011-10-22 NOTE — ED Notes (Signed)
Pt moved to room 4 for monitoring. Lab currently at bedside.

## 2011-10-22 NOTE — Progress Notes (Signed)
7:41 PM  Date: 10/22/2011  Rate: 67  Rhythm: normal sinus rhythm  QRS Axis: normal  Intervals: normal  ST/T Wave abnormalities: nonspecific T wave changes  Conduction Disutrbances:none  Narrative Interpretation: Abnormal EKG  Old EKG Reviewed: unchanged

## 2011-10-22 NOTE — [facility] Counselor (Signed)
Emily Cordova, ACT counselor at [facility], submitted Pt for admission. Emily Cordova, [facility] confirmed an appropriate bed is not currently available. Gave clinical report to Dr. Sudie Grumbling who will accept Pt provided 1) lithium level is not elevated and 2) a 400 hall bed is available. Communicated this information to Emily Cordova.  Emily Cordova Emily Cordova, LPC

## 2011-10-22 NOTE — ED Notes (Signed)
[facility] to see pt.

## 2011-10-22 NOTE — ED Notes (Signed)
Ems called for n/v/d x 4 days. Pt started shaking this morning. Pt alert/ but disoriented to most. cbg in route 215. Diaphoretic/slightly pale. Pt laughing at times.

## 2011-10-22 NOTE — Progress Notes (Signed)
6:34 PM Medication orders rewritten to get correct morning and evening doses of insulin, lithium, and Zyprexa.

## 2011-10-22 NOTE — ED Notes (Signed)
Pt dressed, bed linens changed due to linens being in the floor. Small amount of loose stool noted on pts bottom and cleaned. Not enough to collect specimen.

## 2011-10-22 NOTE — BH Assessment (Signed)
Assessment Note   JAYONNA MEYERING is an 41 y.o. female. PT WAS BROUGHT IN BY DAUGHTER FOR MEDICAL CLEARANCE RELATING TO MEDICAL ISSUES & ALSO EXPRESSED PT HAS BEEN EXHIBITING CONFUSED BEHAVIOR. Pt's daughter reports that she went to [facility] on Wed (10/18/11) and the doctor wants to decrease her Zyprexa dosage to 10 mg. Daughter thinks that the change in medication may have side effects, such as her shaking. Her shaking has gotten so bad she took away her car keys. Pt was cleared to be discharged but nurse found pt sitting at the edge of bed, laughing inappropriately without any clothes on. Pt has not expressed any ideation or hallucination but Daughter is very concerned due to pt's behavior change in the last month. Pt has a hx of hospitalizations & TX. EDP wants pt admitted for mental health stabilization.  Axis I: Schizoaffective Disorder Axis II: Deferred Axis III:  Past Medical History  Diagnosis Date  . Schizophrenia   . Bipolar disorder   . Diabetes mellitus   . Hypercholesteremia   . Hypertension   . Kidney stone    Axis IV: problems with access to health care services Axis V: 21-30 behavior considerably influenced by delusions or hallucinations OR serious impairment in judgment, communication OR inability to function in almost all areas  Past Medical History:  Past Medical History  Diagnosis Date  . Schizophrenia   . Bipolar disorder   . Diabetes mellitus   . Hypercholesteremia   . Hypertension   . Kidney stone     Past Surgical History  Procedure Date  . Cholecystectomy   . Tubal ligation     Family History: History reviewed. No pertinent family history.  Social History:  reports that she has been smoking.  She does not have any smokeless tobacco history on file. She reports that she does not drink alcohol or use illicit drugs.  Additional Social History:     CIWA: CIWA-Ar BP: 118/50 mmHg Pulse Rate: 67  COWS:    Allergies:  Allergies  Allergen Reactions    . Haldol (Haloperidol Decanoate)     Home Medications:  (Not in a hospital admission)  OB/GYN Status:  No LMP recorded.  General Assessment Data Location of Assessment: [facility] ACT Assessment: Yes Living Arrangements: Other relatives;Children Can pt return to current living arrangement?: Yes Admission Status: Voluntary Is patient capable of signing voluntary admission?: Yes Transfer from: Acute Hospital Referral Source: MD     Risk to self Suicidal Ideation: No Suicidal Intent: No Is patient at risk for suicide?: No Suicidal Plan?: No Access to Means: No What has been your use of drugs/alcohol within the last 12 months?: NA Previous Attempts/Gestures: No How many times?: 0  Other Self Harm Risks: NA Triggers for Past Attempts: Unpredictable Intentional Self Injurious Behavior: None Family Suicide History: Yes Recent stressful life event(s): Other (Comment) Persecutory voices/beliefs?: Yes Depression Symptoms: Loss of interest in usual pleasures;Insomnia Substance abuse history and/or treatment for substance abuse?: No Suicide prevention information given to non-admitted patients: Not applicable  Risk to Others Homicidal Ideation: No Thoughts of Harm to Others: No Current Homicidal Intent: No Current Homicidal Plan: No Access to Homicidal Means: No Identified Victim: NA History of harm to others?: No Assessment of Violence: None Noted Violent Behavior Description: COOPERATIVE Does patient have access to weapons?: No Criminal Charges Pending?: No Does patient have a court date: No  Psychosis Hallucinations: None noted Delusions: Grandiose (INAPPROPIRATE LAUGHING, TAKING OFF CLOTHES)  Mental Status Report Appear/Hygiene: Bizarre  Eye Contact: Poor Motor Activity: Psychomotor retardation Speech: Logical/coherent Level of Consciousness: Alert Mood: Anhedonia Affect: Appropriate to circumstance Anxiety Level: None Thought Processes:  Coherent;Circumstantial;Flight of Ideas Judgement: Unimpaired Orientation: Person;Place;Situation Obsessive Compulsive Thoughts/Behaviors: Severe  Cognitive Functioning Concentration: Decreased Memory: Remote Intact;Recent Intact IQ: Average Insight: Poor Impulse Control: Poor Appetite: Fair Weight Gain: 0  Sleep: Decreased Total Hours of Sleep: 3  Vegetative Symptoms: None  ADLScreening [facility] Assessment Services) Patient's cognitive ability adequate to safely complete daily activities?: Yes Patient able to express need for assistance with ADLs?: Yes Independently performs ADLs?: Yes (appropriate for developmental age)  Abuse/Neglect [facility]) Physical Abuse: Denies Verbal Abuse: Denies Sexual Abuse: Denies  Prior Inpatient Therapy Prior Inpatient Therapy: Yes Prior Therapy Dates: UNK Prior Therapy Facilty/Provider(s): [facility], [facility], [facility] Reason for Treatment: STABILIZATION  Prior Outpatient Therapy Prior Outpatient Therapy: Yes Prior Therapy Dates: UNK Prior Therapy Facilty/Provider(s): [facility] Reason for Treatment: MED MANAGEMENT  ADL Screening (condition at time of admission) Patient's cognitive ability adequate to safely complete daily activities?: Yes Patient able to express need for assistance with ADLs?: Yes Independently performs ADLs?: Yes (appropriate for developmental age)       Abuse/Neglect Assessment (Assessment to be complete while patient is alone) Physical Abuse: Denies Verbal Abuse: Denies Sexual Abuse: Denies Values / Beliefs Cultural Requests During Hospitalization: None Spiritual Requests During Hospitalization: None        Additional Information 1:1 In Past 12 Months?: No CIRT Risk: No Elopement Risk: No Does patient have medical clearance?: Yes     Disposition:  Disposition Disposition of Patient: Inpatient treatment program;Referred to ([facility], [facility]) Type of inpatient treatment program: Adult  On Site Evaluation  by:   Reviewed with Physician:     Waldron Session 10/22/2011 1:38 PM

## 2011-10-22 NOTE — ED Notes (Signed)
Pt unable to answer questions appropriately. Daughter states she took pt to doctor last week and they states she was withdrawal from zyprexa. Daughter states loss of bowel and bladder control also and has noticed "a knot" to the back of pts neck.

## 2011-10-22 NOTE — Progress Notes (Signed)
Case discussed with Dr. Lamar Laundry --> will accept in transfer to ICU at [facility].

## 2011-10-22 NOTE — ED Notes (Signed)
Pt having difficulty managing secretions Pt suctioned by [facility]

## 2011-10-22 NOTE — Progress Notes (Signed)
9:30 PM Repeat lithium level was 3.9.  Call to Dr. Ouida Sills, covering for Dr. [name] --> will need transfer to [facility] for dialysis.  Call to Delano Metz, M.D., nephrologist, who requested that I call Critical Care to admit her to ICU.

## 2011-10-22 NOTE — Progress Notes (Signed)
Reviewed pt's lab and her lithium level is over 4.  Exam shows her to be unresponsive.  Call to [facility] --> recommended repeat lithium level, EKG, and increasing fluids to 250 ml per hour normal saline.  Suspect lithium toxicity.

## 2011-10-22 NOTE — ED Notes (Signed)
CRITICAL VALUE ALERT  Critical value received:  Lilthium level=3.92  Date of notification:  10/22/11  Time of notification:  2015  Critical value read back:yes  Nurse who received alert:  Jinny Sanders, RN  MD notified (1st page):    Time of first page:    MD notified (2nd page):  Time of second page:  Responding MD:  Dr. Ignacia Palma notified via phone in the department.  No new orders given.  Time MD responded:  2015

## 2011-10-22 NOTE — ED Notes (Signed)
Pt climbed out of bed, stripped naked and in bedside chair. Pt laughing uncontrollably at times.

## 2011-10-22 NOTE — ED Provider Notes (Signed)
History    This chart was scribed for [name], MD, MD by [name]. The patient was seen in room APA04 and the patient's care was started at 9:29AM.   [name]: [id number]  Arrival date & time 10/22/11  0905   First MD Initiated Contact with Patient 10/22/11 0912      Chief Complaint  Patient presents with  . Vomiting  . Diarrhea   Level 5 caveat for confusion  (Consider location/radiation/quality/duration/timing/severity/associated sxs/prior treatment) Patient is a 41 y.o. female presenting with diarrhea. The history is provided by a relative.  Diarrhea The primary symptoms include diarrhea.   Emily Cordova is a 41 y.o. female who presents to the Emergency Department BIB daughter due to persistent diarrhea onset 2 weeks ago, lethargy and confusion onset 2 weeks ago. She states she is sleeping a lot. Pt's stool has been loose and watery. Pt's daughter reports pt having incontinence, and have incontinance when coughing. Denies having vomiting and nausea. Pt's daughter reports that she went to [facility] on Wed (10/18/11) and the doctor wants to decrease her Zyprexa dosage to 10 mg. Daughter thinks that the change in medication may have side effects, such as her shaking. Her shaking has gotten so bad she took away her car keys.  . Daughter denies pt having fever or recent antibiotic usage. Daughter reports that she has knot on back. Pt has had normal food intake and has taken medication as directed.Daughter states [name] puts her medications in a weekly medication dispenser and patient takes it correctly. Pt has hx of diabetes. They have not checked her glucose recently.   PCP is Dr. [name] GYN Dr [name] seen last month  Past Medical History  Diagnosis Date  . Schizophrenia   . Bipolar disorder   . Diabetes mellitus   . Hypercholesteremia   . Hypertension   . Kidney stone     Past Surgical History  Procedure Date  . Cholecystectomy   . Tubal ligation     History reviewed.  No pertinent family history.  History  Substance Use Topics  . Smoking status: Current Everyday Smoker -- 1.0 packs/day  . Smokeless tobacco: Not on file  . Alcohol Use: No  lives with daughter  OB History    Grav Para Term Preterm Abortions TAB SAB Ect Mult Living                  Review of Systems  Unable to perform ROS: Mental status change  Gastrointestinal: Positive for diarrhea.  Psychiatric/Behavioral: Positive for confusion.    Allergies  Haldol  Home Medications   Current Outpatient Rx  Name Route Sig Dispense Refill  . BENZTROPINE MESYLATE 0.5 MG PO TABS Oral Take 0.5 mg by mouth 2 (two) times daily. Takes with 1mg  tablet twice daily to equal dose of 1.5mg     . BENZTROPINE MESYLATE 1 MG PO TABS Oral Take 1 mg by mouth 2 (two) times daily. Takes with 0.5mg  tablet to equal 1.5mg  dose twice daily    . CHOLECALCIFEROL 400 UNITS PO TABS Oral Take 800 Units by mouth every morning.    Marland Kitchen HYDROCHLOROTHIAZIDE 25 MG PO TABS Oral Take 25 mg by mouth every morning.    . INSULIN ASPART PROT & ASPART (70-30) 100 UNIT/ML [place] SUSP Subcutaneous Inject 40-60 Units into the skin 2 (two) times daily with a meal. 60 units in the morning and 40 units in the evening    . LISINOPRIL 40 MG PO TABS Oral Take  40 mg by mouth daily.    Marland Kitchen LITHIUM CARBONATE 300 MG PO CAPS Oral Take 600-900 mg by mouth 2 (two) times daily. 600mg  every morning (2 capsules) and 900mg  every evening (3 capsules).    . METFORMIN HCL ER (OSM) 1000 MG PO TB24 Oral Take 1,000 mg by mouth 2 (two) times daily.    Marland Kitchen METOPROLOL TARTRATE 25 MG PO TABS Oral Take 12.5 mg by mouth 2 (two) times daily.    Marland Kitchen OLANZAPINE 10 MG PO TABS Oral Take 10-20 mg by mouth 2 (two) times daily. Takes 1 tablet in the AM and takes 2 tablets at bedtime    . OMEGA-3 FATTY ACIDS 500 MG PO CAPS Oral Take 1 capsule by mouth 2 (two) times daily.    Hinda Glatter SUSTENNA IM Intramuscular Inject 117 mg into the muscle every 30 (thirty) days. Due 06/14/2011    .  SIMVASTATIN 20 MG PO TABS Oral Take 20 mg by mouth every evening.      BP 128/63  Pulse 72  Temp 100.3 F (37.9 C) (Oral)  Resp 22  SpO2 94%  Vital signs normal except fever   Physical Exam  Nursing note and vitals reviewed. Constitutional: She appears well-developed and well-nourished.  Non-toxic appearance. She does not appear ill. No distress.  HENT:  Head: Normocephalic and atraumatic.  Right Ear: External ear normal.  Left Ear: External ear normal.  Nose: Nose normal. No mucosal edema or rhinorrhea.  Mouth/Throat: Mucous membranes are normal. No dental abscesses or uvula swelling.       lips are dry   Eyes: Conjunctivae and EOM are normal. Pupils are equal, round, and reactive to light.  Neck: Normal range of motion and full passive range of motion without pain. Neck supple.  Cardiovascular: Regular rhythm.  Exam reveals no gallop and no friction rub.   Murmur (late) heard.  Crescendo systolic murmur is present  Pulmonary/Chest: Breath sounds normal. No respiratory distress. She has no wheezes. She has no rhonchi. She has no rales. She exhibits no tenderness and no crepitus.       Breathing deeply, sometimes snooring  Abdominal: Soft. Normal appearance and bowel sounds are normal. She exhibits no distension. There is tenderness. There is no rebound and no guarding.       ? Mild tenderness without localization  Musculoskeletal: Normal range of motion. She exhibits no edema and no tenderness.       Moves all extremities well.   Neurological: She has normal strength. No cranial nerve deficit.       Not oriented  Pt is drowsy but arouses then falls back to sleep.     Skin: Skin is warm, dry and intact. No rash noted. No erythema. No pallor.  Psychiatric: Her speech is normal. Her mood appears not anxious.       Affect is flat     ED Course  Procedures (including critical care time)   Medications  0.9 %  sodium chloride infusion (1000 mL Intravenous New Bag/Given  10/22/11 1018)    Followed by  0.9 %  sodium chloride infusion (not administered)  loperamide (IMODIUM) capsule 4 mg (4 mg Oral Given 10/22/11 1353)    DIAGNOSTIC STUDIES: Oxygen Saturation is 94% on room air, adequate by my interpretation.    COORDINATION OF CARE:  10:59AM Recheck pt. Pt is disoriented. She does not answer appropriately but knows that she is at [facility]. She is sleeping but awakes to answer some questions.  13:10 Nurse reports patient got out of bed and was sitting in the bedside chair naked. She reports patient bursts out laughing for no reason. Had small loose stool in bed.  13:14 Samara Deist, ACT will evaluate patient.  Results for orders placed during the hospital encounter of 10/22/11  CBC WITH DIFFERENTIAL      Component Value Range   WBC 12.9 (*) 4.0 - 10.5 K/uL   RBC 4.09  3.87 - 5.11 MIL/uL   Hemoglobin 12.6  12.0 - 15.0 g/dL   HCT 14.7  82.9 - 56.2 %   MCV 91.7  78.0 - 100.0 fL   MCH 30.8  26.0 - 34.0 pg   MCHC 33.6  30.0 - 36.0 g/dL   RDW 13.0  86.5 - 78.4 %   Platelets 333  150 - 400 K/uL   Neutrophils Relative 78 (*) 43 - 77 %   Neutro Abs 10.1 (*) 1.7 - 7.7 K/uL   Lymphocytes Relative 13  12 - 46 %   Lymphs Abs 1.6  0.7 - 4.0 K/uL   Monocytes Relative 5  3 - 12 %   Monocytes Absolute 0.6  0.1 - 1.0 K/uL   Eosinophils Relative 4  0 - 5 %   Eosinophils Absolute 0.5  0.0 - 0.7 K/uL   Basophils Relative 0  0 - 1 %   Basophils Absolute 0.0  0.0 - 0.1 K/uL  BASIC METABOLIC PANEL      Component Value Range   Sodium 134 (*) 135 - 145 mEq/L   Potassium 3.5  3.5 - 5.1 mEq/L   Chloride 102  96 - 112 mEq/L   CO2 23  19 - 32 mEq/L   Glucose, Bld 179 (*) 70 - 99 mg/dL   BUN 31 (*) 6 - 23 mg/dL   Creatinine, Ser 6.96 (*) 0.50 - 1.10 mg/dL   Calcium 29.5 (*) 8.4 - 10.5 mg/dL   GFR calc non Af Amer 34 (*) >90 mL/min   GFR calc Af Amer 39 (*) >90 mL/min  URINALYSIS, ROUTINE W REFLEX MICROSCOPIC      Component Value Range   Color, Urine YELLOW  YELLOW    APPearance CLOUDY (*) CLEAR   Specific Gravity, Urine >1.030 (*) 1.005 - 1.030   pH 6.0  5.0 - 8.0   Glucose, UA NEGATIVE  NEGATIVE mg/dL   Hgb urine dipstick NEGATIVE  NEGATIVE   Bilirubin Urine SMALL (*) NEGATIVE   Ketones, ur TRACE (*) NEGATIVE mg/dL   Protein, ur 284 (*) NEGATIVE mg/dL   Urobilinogen, UA 0.2  0.0 - 1.0 mg/dL   Nitrite NEGATIVE  NEGATIVE   Leukocytes, UA NEGATIVE  NEGATIVE  CULTURE, BLOOD (ROUTINE X 2)      Component Value Range   Specimen Description BLOOD LEFT ARM     Special Requests BOTTLES DRAWN AEROBIC ONLY 8CC BOTTLE     Culture PENDING     Report Status PENDING    CULTURE, BLOOD (ROUTINE X 2)      Component Value Range   Specimen Description BLOOD LEFT HAND     Special Requests       Value: BOTTLES DRAWN AEROBIC AND ANAEROBIC 6CC EACH BOTTLE   Culture PENDING     Report Status PENDING    AMMONIA      Component Value Range   Ammonia 44  11 - 60 umol/L  SALICYLATE LEVEL      Component Value Range   Salicylate Lvl <2.0 (*) 2.8 - 20.0 mg/dL  ACETAMINOPHEN LEVEL      Component Value Range   Acetaminophen (Tylenol), Serum <15.0  10 - 30 ug/mL  ETHANOL      Component Value Range   Alcohol, Ethyl (B) <11  0 - 11 mg/dL  URINE RAPID DRUG SCREEN (HOSP PERFORMED)      Component Value Range   Opiates NONE DETECTED  NONE DETECTED   Cocaine NONE DETECTED  NONE DETECTED   Benzodiazepines NONE DETECTED  NONE DETECTED   Amphetamines NONE DETECTED  NONE DETECTED   Tetrahydrocannabinol NONE DETECTED  NONE DETECTED   Barbiturates NONE DETECTED  NONE DETECTED  TROPONIN I      Component Value Range   Troponin I <0.30  <0.30 ng/mL  GLUCOSE, CAPILLARY      Component Value Range   Glucose-Capillary 181 (*) 70 - 99 mg/dL  HEPATIC FUNCTION PANEL      Component Value Range   Total Protein 6.7  6.0 - 8.3 g/dL   Albumin 3.7  3.5 - 5.2 g/dL   AST 14  0 - 37 U/L   ALT 23  0 - 35 U/L   Alkaline Phosphatase 196 (*) 39 - 117 U/L   Total Bilirubin 0.3  0.3 - 1.2 mg/dL     Bilirubin, Direct 0.1  0.0 - 0.3 mg/dL   Indirect Bilirubin 0.2 (*) 0.3 - 0.9 mg/dL  POCT PREGNANCY, URINE      Component Value Range   Preg Test, Ur NEGATIVE  NEGATIVE  URINE MICROSCOPIC-ADD ON      Component Value Range   Squamous Epithelial / LPF FEW (*) RARE   WBC, UA 0-2  <3 WBC/hpf   RBC / HPF 0-2  <3 RBC/hpf   Bacteria, UA MANY (*) RARE   Urine-Other AMORPHOUS URATES/PHOSPHATES     Laboratory interpretation all normal except for leukocytosis, hyperglycemia, new renal insufficiency   Dg Chest Port 1 View  10/22/2011  *RADIOLOGY REPORT*  Clinical Data: Chest congestion.  Altered mental status. Persistent shaking.  PORTABLE CHEST - 1 VIEW  Comparison: 05/15/2010  Findings: Mildly degraded exam due to AP portable technique and patient body habitus.  Midline trachea.  Normal heart size for level of inspiration.  No congestive failure.  No pleural effusion or pneumothorax.  Mildly low lung volumes, without focal opacity.  IMPRESSION: Cardiomegaly and low lung volumes, without acute disease.  Original Report Authenticated By: Consuello Bossier, M.D.     Date: 10/22/2011  Rate: 66  Rhythm: normal sinus rhythm  QRS Axis: normal  Intervals: normal  ST/T Wave abnormalities: nonspecific T wave changes  Conduction Disutrbances:nonspecific intraventricular conduction delay  Narrative Interpretation:   Old EKG Reviewed: changes noted from 05/11/2010    1. Psychosis   2. Diarrhea    Disposition per ACT   Devoria Albe, MD, FACEP   MDM  I personally performed the services described in this documentation, which was scribed in my presence. The recorded information has been reviewed and considered.  Devoria Albe, MD, Armando Gang      [name], MD 10/22/11 [phone]

## 2011-10-22 NOTE — ED Notes (Addendum)
Call daughter [place] informed dgt of transfer to [facility] and need for dialysis, Ms Tiburcio Pea agrees to transfer

## 2011-10-22 NOTE — Progress Notes (Signed)
eLink Physician-Brief Progress Note Patient Name: Emily Cordova DOB: 1970-07-29 MRN: [id number]  Date of Service  10/22/2011   HPI/Events of Note   Arrived lithium OD Will call renal ,needs hd  eICU Interventions  Dc metformin, acei, hctz, arf abg x 1, place oral aireay, may need ett        Nelda Bucks. 10/22/2011, 11:38 PM

## 2011-10-22 NOTE — ED Notes (Signed)
Emily Cordova Pts dgt informed of Room number 2104

## 2011-10-22 NOTE — ED Notes (Signed)
CRITICAL VALUE ALERT  Critical value received:  Lithium 4.08  Date of notification: 10/22/11  Time of notification:  1630  Critical value read back:yes  Nurse who received alert:  Lawernce Ion RN  MD notified (1st page):  [phone]  Time of first page:   MD notified (2nd page):  Time of second page:  Responding MD: Elwyn Lade  Time MD responded:  [phone]

## 2011-10-22 NOTE — ED Notes (Signed)
Patients daughter asked to be notified when a decision is made as to where the patient will be placed.  She can be contacted at [phone]

## 2011-10-22 NOTE — ED Notes (Signed)
[facility] currently at bedside. Will medicate pt after assessment.

## 2011-10-22 NOTE — ED Notes (Signed)
Patient incontinent of urine and stool.  Cleaned w/soap/water. Changed linens.  During this process, pt. Did not wake up from deep sleep.

## 2011-10-23 ENCOUNTER — Encounter (HOSPITAL_COMMUNITY): Payer: Self-pay | Admitting: Obstetrics and Gynecology

## 2011-10-23 ENCOUNTER — Inpatient Hospital Stay (HOSPITAL_COMMUNITY): Payer: Medicaid Other

## 2011-10-23 DIAGNOSIS — J9601 Acute respiratory failure with hypoxia: Secondary | ICD-10-CM | POA: Diagnosis present

## 2011-10-23 DIAGNOSIS — T6591XA Toxic effect of unspecified substance, accidental (unintentional), initial encounter: Secondary | ICD-10-CM

## 2011-10-23 DIAGNOSIS — F29 Unspecified psychosis not due to a substance or known physiological condition: Secondary | ICD-10-CM

## 2011-10-23 DIAGNOSIS — T56894A Toxic effect of other metals, undetermined, initial encounter: Secondary | ICD-10-CM

## 2011-10-23 DIAGNOSIS — T65891A Toxic effect of other specified substances, accidental (unintentional), initial encounter: Secondary | ICD-10-CM

## 2011-10-23 DIAGNOSIS — J9600 Acute respiratory failure, unspecified whether with hypoxia or hypercapnia: Secondary | ICD-10-CM

## 2011-10-23 DIAGNOSIS — R197 Diarrhea, unspecified: Secondary | ICD-10-CM

## 2011-10-23 DIAGNOSIS — R0902 Hypoxemia: Secondary | ICD-10-CM

## 2011-10-23 LAB — CBC
HCT: 34 % — ABNORMAL LOW (ref 36.0–46.0)
MCHC: 32.4 g/dL (ref 30.0–36.0)
MCV: 92.9 fL (ref 78.0–100.0)
Platelets: 294 10*3/uL (ref 150–400)
RDW: 12.7 % (ref 11.5–15.5)

## 2011-10-23 LAB — BASIC METABOLIC PANEL
Chloride: 101 mEq/L (ref 96–112)
GFR calc Af Amer: 68 mL/min — ABNORMAL LOW (ref >90)
Potassium: 3.5 mEq/L (ref 3.5–5.1)
Sodium: 138 mEq/L (ref 135–145)

## 2011-10-23 LAB — LITHIUM LEVEL: Lithium Lvl: 1.33 mEq/L (ref 0.80–1.40)

## 2011-10-23 LAB — PHOSPHORUS: Phosphorus: 2.6 mg/dL (ref 2.3–4.6)

## 2011-10-23 LAB — PROTIME-INR: INR: 1.03 (ref 0.00–1.49)

## 2011-10-23 LAB — GLUCOSE, CAPILLARY
Glucose-Capillary: 103 mg/dL — ABNORMAL HIGH (ref 70–99)
Glucose-Capillary: 116 mg/dL — ABNORMAL HIGH (ref 70–99)
Glucose-Capillary: 177 mg/dL — ABNORMAL HIGH (ref 70–99)
Glucose-Capillary: 159 mg/dL — ABNORMAL HIGH (ref 70–99)
Glucose-Capillary: 130 mg/dL — ABNORMAL HIGH (ref 70–99)

## 2011-10-23 LAB — POCT I-STAT 3, ART BLOOD GAS (G3+)
Bicarbonate: 23.9 mEq/L (ref 20.0–24.0)
TCO2: 25 mmol/L (ref 0–100)
pH, Arterial: 7.322 — ABNORMAL LOW (ref 7.350–7.450)
pO2, Arterial: 120 mmHg — ABNORMAL HIGH (ref 80.0–100.0)

## 2011-10-23 LAB — ALT: ALT: 17 U/L (ref 0–35)

## 2011-10-23 LAB — CLOSTRIDIUM DIFFICILE BY PCR: Toxigenic C. Difficile by PCR: NEGATIVE

## 2011-10-23 LAB — APTT: aPTT: 27 seconds (ref 24–37)

## 2011-10-23 MED ORDER — SODIUM CHLORIDE 0.9 % IV SOLN: 100.0000 mL | INTRAVENOUS | Status: DC | PRN

## 2011-10-23 MED ORDER — BIOTENE DRY MOUTH MT LIQD
15.0000 mL | Freq: Four times a day (QID) | OROMUCOSAL | Status: DC
  Administered 2011-10-23 – 2011-10-29 (×26): 15 mL via OROMUCOSAL

## 2011-10-23 MED ORDER — LIDOCAINE HCL (PF) 1 % IJ SOLN
5.0000 mL | INTRAMUSCULAR | Status: DC | PRN
  Administered 2011-10-23: 5 mL via INTRADERMAL

## 2011-10-23 MED ORDER — PROPOFOL 10 MG/ML IV EMUL
INTRAVENOUS | Status: AC
  Filled 2011-10-23: qty 100

## 2011-10-23 MED ORDER — NEPRO/CARBSTEADY PO LIQD: 237.0000 mL | ORAL | Status: DC | PRN

## 2011-10-23 MED ORDER — CHLORHEXIDINE GLUCONATE 0.12 % MT SOLN
15.0000 mL | Freq: Two times a day (BID) | OROMUCOSAL | Status: DC
  Administered 2011-10-23 – 2011-10-29 (×13): 15 mL via OROMUCOSAL
  Filled 2011-10-23 (×11): qty 15

## 2011-10-23 MED ORDER — ETOMIDATE 2 MG/ML IV SOLN
10.0000 mg | Freq: Once | INTRAVENOUS | Status: AC
  Administered 2011-10-23: 10 mg via INTRAVENOUS

## 2011-10-23 MED ORDER — PROPOFOL 10 MG/ML IV EMUL
5.0000 ug/kg/min | INTRAVENOUS | Status: DC
  Administered 2011-10-23 (×3): 15 ug/kg/min via INTRAVENOUS
  Administered 2011-10-24: 45 ug/kg/min via INTRAVENOUS
  Administered 2011-10-24: 25 ug/kg/min via INTRAVENOUS
  Administered 2011-10-24: 30 ug/kg/min via INTRAVENOUS
  Filled 2011-10-23 (×7): qty 100

## 2011-10-23 MED ORDER — LORAZEPAM 2 MG/ML IJ SOLN
4.0000 mg | Freq: Once | INTRAMUSCULAR | Status: AC
  Administered 2011-10-23: 4 mg via INTRAVENOUS

## 2011-10-23 MED ORDER — INSULIN ASPART 100 UNIT/ML ~~LOC~~ SOLN
1.0000 [IU] | SUBCUTANEOUS | Status: DC
  Administered 2011-10-23: 1 [IU] via SUBCUTANEOUS
  Administered 2011-10-24 (×3): 3 [IU] via SUBCUTANEOUS
  Administered 2011-10-24: 1 [IU] via SUBCUTANEOUS
  Administered 2011-10-24: 3 [IU] via SUBCUTANEOUS
  Administered 2011-10-25: 1 [IU] via SUBCUTANEOUS
  Administered 2011-10-25: 4 [IU] via SUBCUTANEOUS

## 2011-10-23 MED ORDER — WHITE PETROLATUM GEL
Status: AC
  Administered 2011-10-23: 19:00:00
  Filled 2011-10-23: qty 5

## 2011-10-23 MED ORDER — LIDOCAINE HCL (PF) 1 % IJ SOLN
INTRAMUSCULAR | Status: AC
  Administered 2011-10-23: 5 mL via INTRADERMAL
  Filled 2011-10-23: qty 5

## 2011-10-23 MED ORDER — LORAZEPAM 2 MG/ML IJ SOLN
INTRAMUSCULAR | Status: AC
  Administered 2011-10-23: 4 mg via INTRAVENOUS
  Filled 2011-10-23: qty 3

## 2011-10-23 MED ORDER — INSULIN ASPART 100 UNIT/ML ~~LOC~~ SOLN
1.0000 [IU] | SUBCUTANEOUS | Status: DC
  Administered 2011-10-23: 3 [IU] via SUBCUTANEOUS

## 2011-10-23 MED ORDER — HEPARIN SODIUM (PORCINE) 1000 UNIT/ML DIALYSIS: 1000.0000 [IU] | INTRAMUSCULAR | Status: DC | PRN

## 2011-10-23 MED ORDER — LIDOCAINE-PRILOCAINE 2.5-2.5 % EX CREA: 1.0000 "application " | TOPICAL_CREAM | CUTANEOUS | Status: DC | PRN

## 2011-10-23 MED ORDER — HEPARIN SODIUM (PORCINE) 1000 UNIT/ML IJ SOLN
2.4000 mL | Freq: Once | INTRAMUSCULAR | Status: AC
  Administered 2011-10-23: 2400 [IU] via INTRAVENOUS

## 2011-10-23 MED ORDER — PANTOPRAZOLE SODIUM 40 MG IV SOLR
40.0000 mg | INTRAVENOUS | Status: DC
  Administered 2011-10-23 – 2011-10-24 (×2): 40 mg via INTRAVENOUS
  Filled 2011-10-23 (×3): qty 40

## 2011-10-23 MED ORDER — ALTEPLASE 2 MG IJ SOLR: 2.0000 mg | Freq: Once | INTRAMUSCULAR | Status: AC | PRN

## 2011-10-23 MED ORDER — ROCURONIUM BROMIDE 50 MG/5ML IV SOLN
50.0000 mg | Freq: Once | INTRAVENOUS | Status: AC
  Administered 2011-10-23: 50 mg via INTRAVENOUS

## 2011-10-23 MED ORDER — PENTAFLUOROPROP-TETRAFLUOROETH EX AERO: 1.0000 "application " | INHALATION_SPRAY | CUTANEOUS | Status: DC | PRN

## 2011-10-23 MED ORDER — HEPARIN SODIUM (PORCINE) 1000 UNIT/ML IJ SOLN
INTRAMUSCULAR | Status: AC
  Administered 2011-10-23: 2400 [IU] via INTRAVENOUS
  Filled 2011-10-23: qty 3

## 2011-10-23 MED ORDER — LORAZEPAM 2 MG/ML IJ SOLN
INTRAMUSCULAR | Status: AC
  Administered 2011-10-23: 4 mg via INTRAVENOUS
  Filled 2011-10-23: qty 2

## 2011-10-23 NOTE — Significant Event (Signed)
CRITICAL VALUE ALERT  Critical value received: Lithium toxicity 1.67  Date of notification:  10/22/11  Time of notification:  1845  Critical value read back:yes  Nurse who received alert:  Milinda Cave, RN  MD notified (1st page):  Dr. Marin Shutter  Time of first page:  1900  MD notified (2nd page):  Time of second page:  Responding MD:  Dr. Marin Shutter  Time MD responded:  1900

## 2011-10-23 NOTE — Progress Notes (Signed)
Pt. Placed on brown contact precautions because of watery profuse diarrhea. I sent down a c. Diff sample.

## 2011-10-23 NOTE — Progress Notes (Signed)
eLink Physician-Brief Progress Note Patient Name: KHALIDAH HERBOLD DOB: 1970-06-16 MRN: [id number]  Date of Service  10/23/2011   HPI/Events of Note   abg reviewed  eICU Interventions  vetn MV adjusted up, peep increased to get to goal 50% abg reviewed witgh RT      FEINSTEIN,DANIEL J. 10/23/2011, 3:47 AM

## 2011-10-23 NOTE — Progress Notes (Signed)
Name: CHERESE Cordova MRN: [id number] DOB: [birth year]    LOS: 1  Referring Provider:  OSH Reason for Referral:  Lithium toxicity need for dialysis  PULMONARY / CRITICAL CARE MEDICINE  HPI:  Ms. Emily Cordova is a 41 y/o woman with an extensive past psychiatric history who presented to an outside hospital with altered behavior.  She was found to have a lithium level of 4 and was transferred to [facility] to initiate dialysis.  According to her daughter her psychiatrist has been titrating her psychiatric medications recently.  Upon arrival in the ICU she will briefly open her eyes to physical stimulus but will not maintain alertness.   Brief patient description:  42 y/o obese pt with lithium overdose requiring dialyis  Events Since Admission: Intubation 8/18 for AMS Dialysis catheter placement RIJ 8/18  Current Status: Critical  Vital Signs: Temp:  [98.9 F (37.2 C)-100.3 F (37.9 C)] 99.1 F (37.3 C) (08/19 0400) Pulse Rate:  [61-77] 65  (08/19 0830) Resp:  [13-27] 25  (08/19 0830) BP: (84-133)/(33-74) 100/41 mmHg (08/19 0830) SpO2:  [91 %-100 %] 100 % (08/19 0830) FiO2 (%):  [49.6 %-100 %] 50.3 % (08/19 0830) Weight:  [112.2 kg (247 lb 5.7 oz)-117 kg (257 lb 15 oz)] 112.2 kg (247 lb 5.7 oz) (08/19 0200)  Physical Examination: General:  Obese woman, non-responsive. Neuro:  PERRL, opens eyes to physical stimuli HEENT: [place]/AT, PERRL, EOM-I and MMM. Neck:  supple Cardiovascular:  NRRR, II/VII systolic murmur, loudest at LUSB Lungs:  CTAB, no wrr Abdomen:  Obese, NTND, +BS, no appreciable HSM Musculoskeletal:  No joint abnormalities, no c/c/e Skin:  No rash  Principal Problem:  *Lithium toxicity Active Problems:  Bipolar 1 disorder  Altered mental status  ASSESSMENT AND PLAN  PULMONARY  Lab 10/23/11 0331 10/22/11 2347  PHART 7.322* 7.360  PCO2ART 46.3* 38.3  PO2ART 120.0* 77.0*  HCO3 23.9 21.6  O2SAT 98.0 95.0   Ventilator Settings: Vent Mode:  [-] PRVC FiO2 (%):  [49.6  %-100 %] 50.3 % Set Rate:  [14 bmp-18 bmp] 18 bmp Vt Set:  [410 mL-450 mL] 410 mL PEEP:  [5 cmH20-8 cmH20] 8 cmH20 Plateau Pressure:  [22 cmH20] 22 cmH20 CXR:  ETT in good position, dialysis catheter in RIJ in good position, no obvious infiltrates ETT:  7.5 at 24 at the lip  A:  Intubated for airway protection secondary to altered mental status P:   Airway protective ventilation and hypoxemia. Decrease FiO2 to 40 and PEEP to 5. SBT in AM.  CARDIOVASCULAR  Lab 10/22/11 0947  TROPONINI <0.30  LATICACIDVEN --  PROBNP --   ECG:  Sinus rhythm Lines: RIJ trialysis catheter  A: Normal sinus rhythm, normotensive P:  Continuous cardio-pulmonary monitoring  RENAL  Lab 10/23/11 0240 10/22/11 0914  NA -- 134*  K -- 3.5  CL -- 102  CO2 -- 23  BUN -- 31*  CREATININE 1.85* 1.79*  CALCIUM -- 11.0*  MG -- --  PHOS -- --   Intake/Output      08/18 0701 - 08/19 0700 08/19 0701 - 08/20 0700   I.V. (mL/kg) 3323.5 (29.6)    Total Intake(mL/kg) 3323.5 (29.6)    Stool 510    Total Output 510    Net +2813.5         Stool Occurrence 2 x     Foley:  In place 10/22/11  A:  ARF 2/2 lithium toxicity requiring dialysis for lithium removal P:   Dialysis catheter placed HD completed.  Defer to renal for next dialysis session and lithium levels.  GASTROINTESTINAL  Lab 10/23/11 0240 10/22/11 1000  AST -- 14  ALT 17 23  ALKPHOS -- 196*  BILITOT -- 0.3  PROT -- 6.7  ALBUMIN -- 3.7    A:  History of diarrhea for the last 4 days P:   No recent history of antibiotics to suggest C. Dif Stool for C. diff.   HEMATOLOGIC  Lab 10/23/11 0500 10/23/11 0240 10/22/11 0914  HGB 11.0* -- 12.6  HCT 34.0* -- 37.5  PLT 294 -- 333  INR -- 1.03 --  APTT -- 27 --   A:  No current problems P:  Follow daily H/H, platelets Heparin for DVT prophylaxis  INFECTIOUS  Lab 10/23/11 0500 10/22/11 0914  WBC 20.1* 12.9*  PROCALCITON -- --   Cultures: none Antibiotics: non3  A:  No  current signs or symptoms of infection P:   Monitor for signs and symptoms of infection  ENDOCRINE  Lab 10/23/11 0814 10/23/11 0350 10/22/11 2327 10/22/11 1959 10/22/11 0943  GLUCAP 116* 103* 117* 110* 181*   A:  DM previously on metformin   P:   Stop metformin with elevated creatinine (contraindicated if Cr >1.4 in women)  NEUROLOGIC  A:  AMS, psych history P:   AMS due to lithium toxicity Once extubated consult with psychiatry for help with psych meds.  These had been titrated by patients psychiatrist recently.  CC time 45 min.  Alyson Reedy, M.D. [facility] Pulmonary/Critical Care Medicine. Pager: [phone]. After hours pager: [phone].

## 2011-10-23 NOTE — Progress Notes (Signed)
Pt. Taken off contact because c. Diff sample is negative.

## 2011-10-23 NOTE — H&P (Signed)
Name: Emily Cordova MRN: [id number] DOB: 06-21-1970    LOS: 1  Referring Provider:  OSH Reason for Referral:  Lithium toxicity need for dialysis  PULMONARY / CRITICAL CARE MEDICINE  HPI:  Emily Cordova is a 41 y/o woman with an extensive past psychiatric history who presented to an outside hospital with altered behavior.  She was found to have a lithium level of 4 and was transferred to [facility] to initiate dialysis.  According to her daughter her psychiatrist has been titrating her psychiatric medications recently.  Upon arrival in the ICU she will briefly open her eyes to physical stimulus but will not maintain alertness.   Past Medical History  Diagnosis Date  . Schizophrenia   . Bipolar disorder   . Diabetes mellitus   . Hypercholesteremia   . Hypertension   . Kidney stone    Past Surgical History  Procedure Date  . Cholecystectomy   . Tubal ligation    Prior to Admission medications   Medication Sig Start Date End Date Taking? Authorizing Provider  Calcium 1250 MG TABS Take 1,250 mg by mouth 2 (two) times daily.   Yes Historical Provider, MD  CHOLECALCIFEROL PO Take 6,000 Units by mouth daily.   Yes Historical Provider, MD  Insulin Isophane & Regular (HUMULIN 70/30 PEN [place]) Inject 35-60 Units into the skin 2 (two) times daily. Patient uses 60 units at breakfast and 35 units at dinner   Yes Historical Provider, MD  metFORMIN (GLUCOPHAGE) 1000 MG tablet Take 1,000 mg by mouth 2 (two) times daily.   Yes Historical Provider, MD  benztropine (COGENTIN) 0.5 MG tablet Take 0.5 mg by mouth 2 (two) times daily. Takes with 1mg  tablet twice daily to equal dose of 1.5mg     Historical Provider, MD  benztropine (COGENTIN) 1 MG tablet Take 1 mg by mouth 2 (two) times daily. Takes with 0.5mg  tablet to equal 1.5mg  dose twice daily    Historical Provider, MD  insulin aspart protamine-insulin aspart (NOVOLOG 70/30) (70-30) 100 UNIT/ML injection Inject 40-60 Units into the skin 2 (two) times daily  with a meal. 60 units in the morning and 40 units in the evening    Historical Provider, MD  lithium carbonate 300 MG capsule Take 600-900 mg by mouth 2 (two) times daily. 600mg  every morning (2 capsules) and 900mg  every evening (3 capsules).    Historical Provider, MD  metoprolol tartrate (LOPRESSOR) 25 MG tablet Take 12.5 mg by mouth 2 (two) times daily.    Historical Provider, MD  OLANZapine (ZYPREXA) 10 MG tablet Take 10-20 mg by mouth 2 (two) times daily. Takes 1 tablet in the AM and takes 2 tablets at bedtime    Historical Provider, MD  Omega-3 Fatty Acids 500 MG CAPS Take 1 capsule by mouth 2 (two) times daily.    Historical Provider, MD   Allergies Allergies  Allergen Reactions  . Etomidate Other (See Comments)    Severe Myoclonus: SE  . Haldol (Haloperidol Decanoate)     Family History History reviewed. No pertinent family history. Social History  reports that she has been smoking.  She does not have any smokeless tobacco history on file. She reports that she does not drink alcohol or use illicit drugs.  Review Of Systems:  Could not be obtained as patient is obtunded  Brief patient description:  41 y/o obese pt with lithium overdose requiring dialyis  Events Since Admission: Intubation 8/18 for AMS Dialysis catheter placement RIJ 8/18  Current Status:  Vital  Signs: Temp:  [98.9 F (37.2 C)-100.3 F (37.9 C)] 99.1 F (37.3 C) (08/19 0400) Pulse Rate:  [61-77] 66  (08/19 0500) Resp:  [13-25] 25  (08/19 0500) BP: (84-133)/(35-74) 102/42 mmHg (08/19 0500) SpO2:  [91 %-100 %] 100 % (08/19 0500) FiO2 (%):  [49.9 %-100 %] 49.9 % (08/19 0500) Weight:  [112.2 kg (247 lb 5.7 oz)-117 kg (257 lb 15 oz)] 112.2 kg (247 lb 5.7 oz) (08/19 0200)  Physical Examination: General:  Obese woman, non-responsive  Neuro:  PERRL, opens eyes to physical stimuli, does not maintain alertness, does not follow commans HEENT:  Large tongue, OP clear Neck:  supple Cardiovascular:  NRRR, II/VII  systolic murmur, loudest at LUSB Lungs:  CTAB, no wrr Abdomen:  Obese, NTND, +BS, no appreciable HSM Musculoskeletal:  No joint abnormalities, no c/c/e Skin:  No rash  Principal Problem:  *Lithium toxicity Active Problems:  Altered mental status  Bipolar 1 disorder   ASSESSMENT AND PLAN  PULMONARY  Lab 10/23/11 0331 10/22/11 2347  PHART 7.322* 7.360  PCO2ART 46.3* 38.3  PO2ART 120.0* 77.0*  HCO3 23.9 21.6  O2SAT 98.0 95.0   Ventilator Settings: Vent Mode:  [-] PRVC FiO2 (%):  [49.9 %-100 %] 49.9 % Set Rate:  [14 bmp-18 bmp] 18 bmp Vt Set:  [410 mL-450 mL] 410 mL PEEP:  [5 cmH20-8 cmH20] 8 cmH20 Plateau Pressure:  [22 cmH20] 22 cmH20 CXR:  ETT in good position, dialysis catheter in RIJ in good position, no obvious infiltrates ETT:  7.5 at 24 at the lip  A:  Intubated for airway protection secondary to altered mental status P:   Airway protective ventilation Wean ventilator settings as able when mental status improves  CARDIOVASCULAR  Lab 10/22/11 0947  TROPONINI <0.30  LATICACIDVEN --  PROBNP --   ECG:  Sinus rhythm Lines: RIJ trialysis catheter  A: Normal sinus rhythm, normotensive P:  Continuous cardio-pulmonary monitoring  RENAL  Lab 10/23/11 0240 10/22/11 0914  NA -- 134*  K -- 3.5  CL -- 102  CO2 -- 23  BUN -- 31*  CREATININE 1.85* 1.79*  CALCIUM -- 11.0*  MG -- --  PHOS -- --   Intake/Output      08/18 0701 - 08/19 0700   I.V. (mL/kg) 2542 (22.7)   Total Intake(mL/kg) 2542 (22.7)   Stool 60   Total Output 60   Net +2482       Stool Occurrence 2 x    Foley:  In place 10/22/11  A:  ARF 2/2 lithium toxicity requiring dialysis for lithium removal P:   Dialysis catheter placed Will start HD for lithium toxicity, plan for 4 hours of dialysis, then repeat lithium level.  If still elevated will repeat dialysis. Appreciate nephrology involvement  GASTROINTESTINAL  Lab 10/23/11 0240 10/22/11 1000  AST -- 14  ALT 17 23  ALKPHOS -- 196*   BILITOT -- 0.3  PROT -- 6.7  ALBUMIN -- 3.7    A:  History of diarrhea for the last 4 days P:   No recent history of antibiotics to suggest C. Dif If does not resolve will send stool studies and C. Dif.   HEMATOLOGIC  Lab 10/23/11 0240 10/22/11 0914  HGB -- 12.6  HCT -- 37.5  PLT -- 333  INR 1.03 --  APTT 27 --   A:  No current problems P:  Follow daily H/H, platelets Heparin for DVT prophylaxis  INFECTIOUS  Lab 10/22/11 0914  WBC 12.9*  PROCALCITON --  Cultures: none Antibiotics: non3  A:  No current signs or symptoms of infection P:   Monitor for signs and symptoms of infection  ENDOCRINE  Lab 10/23/11 0350 10/22/11 2327 10/22/11 1959 10/22/11 0943  GLUCAP 103* 117* 110* 181*   A:  DM previously on metformin   P:   Follow glucoses, if persistantly elevated will start basal/bolus and ssi regimen Stop metformin with elevated creatinine (contraindicated if Cr >1.4 in women)  NEUROLOGIC  A:  AMS, psych history P:   AMS due to lithium toxicity Once extubated consult with psychiatry for help with psych meds.  These had been titrated by patients psychiatrist recently.  BEST PRACTICE / DISPOSITION Level of Care:  ICU Primary Service:  PCCM Consultants:  nephrology Code Status:  full Diet:  npo DVT Px:  heparin GI Px:  pepcid Skin Integrity:  good Social / Family:  Daughter at bedside  Carolan Clines., M.D. Pulmonary and Critical Care Medicine [facility] Pager: [phone]  10/23/2011, 5:05 AM  I spent 45 minutes of critical care time with this patient outside of procedures which are documented elsewhere.

## 2011-10-23 NOTE — Procedures (Signed)
Venous Dialysis Catheter Insertion Procedure Note Emily Cordova [id number] 20-May-1970  Procedure: Insertion of Central Venous Catheter Indications: Emergent dialysis for drug toxicity  Procedure Details Consent: Risks of procedure as well as the alternatives and risks of each were explained to the (patient/caregiver).  Consent for procedure obtained. Time Out: Verified patient identification, verified procedure, site/side was marked, verified correct patient position, special equipment/implants available, medications/allergies/relevent history reviewed, required imaging and test results available.  Performed  Maximum sterile technique was used including antiseptics, cap, gloves, gown, hand hygiene, mask and sheet. Skin prep: Chlorhexidine; local anesthetic administered A antimicrobial bonded/coated trialysis 15cm catheter was placed in the right internal jugular vein using the Seldinger technique.  Evaluation Blood flow good Complications: No apparent complications Patient did tolerate procedure well. Chest X-ray ordered to verify placement.  CXR: normal.  Emily Cordova. 10/23/2011, 5:03 AM

## 2011-10-23 NOTE — Procedures (Signed)
On hemodialysis via internal jugular catheter for Lithium Toxicity.   We will follow up post hemodialysis Lithium level and decide about need for repeat hemodialysis. Emily Cordova C

## 2011-10-23 NOTE — Procedures (Signed)
Intubation Procedure Note Emily Cordova [id number] [birth year]  Procedure: Intubation Indications: Airway protection and maintenance  Procedure Details Consent: Risks of procedure as well as the alternatives and risks of each were explained to the (patient/caregiver).  Consent for procedure obtained. Time Out: Verified patient identification, verified procedure, site/side was marked, verified correct patient position, special equipment/implants available, medications/allergies/relevent history reviewed, required imaging and test results available.  Performed  Maximum sterile technique was used including gloves and mask.  The patient was intubated with the glide scope size 3 blade Patient was initially sedated with 10mg  etomidate.  This caused significant myoclonus and desaturation to the high 70s.  This was treated with a total of 8mg  ativan.  The patient was then sedated with 70mg  and 50mg  rocuronium.    Evaluation Hemodynamic Status: BP stable throughout; O2 sats: transiently fell during during procedure Patient's Current Condition: stable Complications: Complications of myoclonus as listed above Patient did tolerate procedure well with the exceptions listed above. Chest X-ray ordered to verify placement.  CXR: tube position acceptable.   Federick Levene K. 10/23/2011

## 2011-10-23 NOTE — Consult Note (Signed)
Emily Cordova 10/23/2011 Emily Cordova Requesting Physician:  Dr. Tyson Cordova  Reason for Consult:  Lithium toxicity HPI: The patient is a 41 y.o. year-old with hx of bipolar disorder, schizophrenia, DM, HTN and HL who presented to ED this am brought by family with reports of bowel and bladder incontinence and concerns for Zyprexa withdrawal. History obtained from the chart. She was confused. She had been having lots of diarrhea and sleeping a lot. These problems had been going on for about 2 weeks. Labs eventually showed a lithium level of 4.0 with repeat of 3.92 and patient has been admitted to MICU. Renal service asked to see patient for acute HD.  Creat is 1.79.  IVF"s are at 250 mL/hr.   Patient now intubated, on vent and sedated. Does not provide any history.   Creatinine, Ser  Date/Time Value Range Status  10/22/2011  9:14 AM 1.79* 0.50 - 1.10 mg/dL Final  03/11/1094  0:45 PM 0.49* 0.50 - 1.10 mg/dL Final  4/0/9811  9:14 PM 0.60  0.50 - 1.10 mg/dL Final  7/82/9562  1:30 AM 1.04  0.50 - 1.10 mg/dL Final  8/65/7846 96:29 AM 0.70  0.4 - 1.2 mg/dL Final  07/06/8411  2:44 PM 0.62  0.4 - 1.2 mg/dL Final  0/03/270  5:36 AM 0.74  0.4 - 1.2 mg/dL Final  08/07/4032  7:42 PM 0.62  0.4 - 1.2 mg/dL Final  07/12/5636 75:64 AM 0.68  0.4 - 1.2 mg/dL Final  33/29/5188  4:16 PM 0.60  0.4 - 1.2 mg/dL Final  60/63/0160  1:09 PM 0.74  0.4 - 1.2 mg/dL Final  32/35/5732  2:02 PM 0.61  0.4 - 1.2 mg/dL Final  54/04/7060 37:62 AM 0.64  0.4 - 1.2 mg/dL Final  10/07/1515  6:16 AM 0.96  0.4 - 1.2 mg/dL Final  0/09/3708  6:26 AM 0.73  0.4 - 1.2 mg/dL Final  11/07/8544  2:70 PM 0.74  0.4 - 1.2 mg/dL Final  3/50/0938 18:29 PM 0.69  0.4 - 1.2 mg/dL Final  9/37/1696 78:93 PM 0.63  0.4 - 1.2 mg/dL Final  81/0/1751  0:25 PM 0.63   Final    Past Medical History:  Past Medical History  Diagnosis Date  . Schizophrenia   . Bipolar disorder   . Diabetes mellitus   . Hypercholesteremia   . Hypertension   . Kidney stone       Past Surgical History:  Past Surgical History  Procedure Date  . Cholecystectomy   . Tubal ligation     Family History: History reviewed. No pertinent family history. Social History:  reports that she has been smoking.  She does not have any smokeless tobacco history on file. She reports that she does not drink alcohol or use illicit drugs.  Allergies:  Allergies  Allergen Reactions  . Haldol (Haloperidol Decanoate)     Home medications: Prior to Admission medications   Medication Sig Start Date End Date Taking? Authorizing Provider  Calcium 1250 MG TABS Take 1,250 mg by mouth 2 (two) times daily.   Yes Historical Provider, MD  CHOLECALCIFEROL PO Take 6,000 Units by mouth daily.   Yes Historical Provider, MD  Insulin Isophane & Regular (HUMULIN 70/30 PEN [place]) Inject 35-60 Units into the skin 2 (two) times daily. Patient uses 60 units at breakfast and 35 units at dinner   Yes Historical Provider, MD  metFORMIN (GLUCOPHAGE) 1000 MG tablet Take 1,000 mg by mouth 2 (two) times daily.   Yes Historical Provider, MD  benztropine (COGENTIN)  0.5 MG tablet Take 0.5 mg by mouth 2 (two) times daily. Takes with 1mg  tablet twice daily to equal dose of 1.5mg     Historical Provider, MD  benztropine (COGENTIN) 1 MG tablet Take 1 mg by mouth 2 (two) times daily. Takes with 0.5mg  tablet to equal 1.5mg  dose twice daily    Historical Provider, MD  insulin aspart protamine-insulin aspart (NOVOLOG 70/30) (70-30) 100 UNIT/ML injection Inject 40-60 Units into the skin 2 (two) times daily with a meal. 60 units in the morning and 40 units in the evening    Historical Provider, MD  lithium carbonate 300 MG capsule Take 600-900 mg by mouth 2 (two) times daily. 600mg  every morning (2 capsules) and 900mg  every evening (3 capsules).    Historical Provider, MD  metoprolol tartrate (LOPRESSOR) 25 MG tablet Take 12.5 mg by mouth 2 (two) times daily.    Historical Provider, MD  OLANZapine (ZYPREXA) 10 MG tablet Take  10-20 mg by mouth 2 (two) times daily. Takes 1 tablet in the AM and takes 2 tablets at bedtime    Historical Provider, MD  Omega-3 Fatty Acids 500 MG CAPS Take 1 capsule by mouth 2 (two) times daily.    Historical Provider, MD    Inpatient medications:    . sodium chloride  1,000 mL Intravenous Once  . aspirin  324 mg Oral NOW   Or  . aspirin  300 mg Rectal NOW  . heparin      . heparin  5,000 Units Subcutaneous Q8H  . lidocaine      . loperamide  4 mg Oral Once  . LORazepam      . LORazepam      . propofol      . DISCONTD: benztropine  0.5 mg Oral BID  . DISCONTD: benztropine  1 mg Oral BID  . DISCONTD: hydrochlorothiazide  25 mg Oral q morning - 10a  . DISCONTD: insulin aspart protamine-insulin aspart  40-60 Units Subcutaneous BID WC  . DISCONTD: insulin NPH  35 Units Subcutaneous QHS  . DISCONTD: insulin NPH  60 Units Subcutaneous QAC breakfast  . DISCONTD: lisinopril  40 mg Oral Daily  . DISCONTD: lithium carbonate  900 mg Oral Nightly  . DISCONTD: lithium carbonate  600 mg Oral q morning - 10a  . DISCONTD: lithium carbonate  600-900 mg Oral BID  . DISCONTD: metformin  1,000 mg Oral BID  . DISCONTD: metoprolol tartrate  12.5 mg Oral BID  . DISCONTD: nicotine  21 mg Transdermal Daily  . DISCONTD: OLANZapine  10 mg Oral q morning - 10a  . DISCONTD: OLANZapine  10-20 mg Oral BID  . DISCONTD: OLANZapine  20 mg Oral QHS  . DISCONTD: simvastatin  20 mg Oral QPM    Review of Systems  Not available.     Labs: Basic Metabolic Panel:  Lab 10/22/11 1610  NA 134*  K 3.5  CL 102  CO2 23  GLUCOSE 179*  BUN 31*  CREATININE 1.79*  ALB --  CALCIUM 11.0*  PHOS --   Liver Function Tests:  Lab 10/22/11 1000  AST 14  ALT 23  ALKPHOS 196*  BILITOT 0.3  PROT 6.7  ALBUMIN 3.7   No results found for this basename: LIPASE:3,AMYLASE:3 in the last 168 hours  Lab 10/22/11 0947  AMMONIA 44   CBC:  Lab 10/22/11 0914  WBC 12.9*  NEUTROABS 10.1*  HGB 12.6  HCT 37.5    MCV 91.7  PLT 333   PT/INR: @labrcntip (inr:5)  Cardiac Enzymes:  Lab 10/22/11 0947  CKTOTAL --  CKMB --  CKMBINDEX --  TROPONINI <0.30   CBG:  Lab 10/22/11 2327 10/22/11 1959 10/22/11 0943  GLUCAP 117* 110* 181*   Xrays/Other Studies: Dg Chest Port 1 View  10/22/2011  *RADIOLOGY REPORT*  Clinical Data: Chest congestion.  Altered mental status. Persistent shaking.  PORTABLE CHEST - 1 VIEW  Comparison: 05/15/2010  Findings: Mildly degraded exam due to AP portable technique and patient body habitus.  Midline trachea.  Normal heart size for level of inspiration.  No congestive failure.  No pleural effusion or pneumothorax.  Mildly low lung volumes, without focal opacity.  IMPRESSION: Cardiomegaly and low lung volumes, without acute disease.  Original Report Authenticated By: Consuello Bossier, M.Cordova.    Physical Exam:  Blood pressure 127/57, pulse 77, temperature 99 F (37.2 C), temperature source Oral, resp. rate 24, weight 117 kg (257 lb 15 oz), SpO2 94.00%.  Gen: on vent, sedated Skin: no rash, cyanosis HEENT:  EOMI, sclera anicteric, throat with ETT in place Neck: no JVD, no bruits or LAN Chest: clear bilat, no wheeze, no rales Heart: regular, no rub or gallop, no murmur Abdomen: soft, obese, +BS, no mass or HSM Ext: no joint effusion, trace LE edema Neuro: unresponsive, on vent and sedated   Impression/Plan 1. Lithium toxicity w altered MS- recommend acute HD x 4 hours, then repeat level 1 hr after HD. More than one HD session may be necessary.  Discussed with primary team and poison control center.  2. Renal insufficiency- creat was normal at 0.5 in April, creat 1.7 today.  Observe with IVF's. UA 100 prot, 0-2 rbc. Doubt intrinsic renal disease.  3. Hx schizophrenia and bipolar disorder 4. HTN- takes ACEI, metoprolol and HCTZ. On hold for now.   Will follow.    Vinson Moselle  MD [facility] [phone] pgr    [phone] cell 10/23/2011, 12:51 AM

## 2011-10-24 ENCOUNTER — Inpatient Hospital Stay (HOSPITAL_COMMUNITY): Payer: Medicaid Other

## 2011-10-24 LAB — GLUCOSE, CAPILLARY
Glucose-Capillary: 130 mg/dL — ABNORMAL HIGH (ref 70–99)
Glucose-Capillary: 151 mg/dL — ABNORMAL HIGH (ref 70–99)
Glucose-Capillary: 188 mg/dL — ABNORMAL HIGH (ref 70–99)
Glucose-Capillary: 194 mg/dL — ABNORMAL HIGH (ref 70–99)

## 2011-10-24 LAB — BASIC METABOLIC PANEL
CO2: 20 mEq/L (ref 19–32)
Chloride: 106 mEq/L (ref 96–112)
GFR calc Af Amer: 56 mL/min — ABNORMAL LOW (ref >90)
Potassium: 3.4 mEq/L — ABNORMAL LOW (ref 3.5–5.1)
Sodium: 141 mEq/L (ref 135–145)

## 2011-10-24 LAB — POCT I-STAT 3, ART BLOOD GAS (G3+)
Acid-base deficit: 7 mmol/L — ABNORMAL HIGH (ref 0.0–2.0)
O2 Saturation: 98 %
Patient temperature: 99.6

## 2011-10-24 LAB — LITHIUM LEVEL: Lithium Lvl: 0.71 mEq/L — ABNORMAL LOW (ref 0.80–1.40)

## 2011-10-24 LAB — CBC
MCV: 93.2 fL (ref 78.0–100.0)
Platelets: 264 10*3/uL (ref 150–400)
RBC: 3.39 MIL/uL — ABNORMAL LOW (ref 3.87–5.11)
RDW: 13 % (ref 11.5–15.5)
WBC: 15.2 10*3/uL — ABNORMAL HIGH (ref 4.0–10.5)

## 2011-10-24 LAB — URINE CULTURE: Colony Count: NO GROWTH

## 2011-10-24 LAB — MAGNESIUM: Magnesium: 1.4 mg/dL — ABNORMAL LOW (ref 1.5–2.5)

## 2011-10-24 LAB — PHOSPHORUS: Phosphorus: 2.6 mg/dL (ref 2.3–4.6)

## 2011-10-24 MED ORDER — MIDAZOLAM HCL 2 MG/2ML IJ SOLN
2.0000 mg | INTRAMUSCULAR | Status: DC | PRN
  Administered 2011-10-24 (×2): 2 mg via INTRAVENOUS
  Filled 2011-10-24 (×2): qty 2

## 2011-10-24 MED ORDER — POTASSIUM CHLORIDE IN NACL 20-0.45 MEQ/L-% IV SOLN
INTRAVENOUS | Status: DC
  Administered 2011-10-24: 17:00:00 via INTRAVENOUS
  Filled 2011-10-24 (×5): qty 1000

## 2011-10-24 MED ORDER — HEPARIN SODIUM (PORCINE) 1000 UNIT/ML DIALYSIS
20.0000 [IU]/kg | INTRAMUSCULAR | Status: DC | PRN
  Administered 2011-10-24: 2300 [IU] via INTRAVENOUS_CENTRAL

## 2011-10-24 MED ORDER — FENTANYL CITRATE 0.05 MG/ML IJ SOLN
INTRAMUSCULAR | Status: AC
  Administered 2011-10-24: 100 ug via INTRAVENOUS
  Filled 2011-10-24: qty 2

## 2011-10-24 MED ORDER — FENTANYL CITRATE 0.05 MG/ML IJ SOLN: 50.0000 ug | INTRAMUSCULAR | Status: DC | PRN

## 2011-10-24 MED ORDER — SODIUM CHLORIDE 0.9 % IV SOLN
3.0000 g | Freq: Four times a day (QID) | INTRAVENOUS | Status: DC
  Administered 2011-10-24 – 2011-10-26 (×8): 3 g via INTRAVENOUS
  Filled 2011-10-24 (×10): qty 3

## 2011-10-24 MED ORDER — PROMOTE PO LIQD
1000.0000 mL | ORAL | Status: DC
  Administered 2011-10-24: 20 mL
  Administered 2011-10-26: 1000 mL
  Filled 2011-10-24 (×6): qty 1000

## 2011-10-24 MED ORDER — ACETAMINOPHEN 650 MG RE SUPP
650.0000 mg | Freq: Once | RECTAL | Status: AC
  Administered 2011-10-24: 650 mg via RECTAL
  Filled 2011-10-24: qty 1

## 2011-10-24 MED ORDER — PRO-STAT SUGAR FREE PO LIQD
30.0000 mL | Freq: Every day | ORAL | Status: DC
  Administered 2011-10-24 – 2011-10-27 (×15): 30 mL
  Filled 2011-10-24 (×24): qty 30

## 2011-10-24 MED ORDER — MIDAZOLAM HCL 2 MG/2ML IJ SOLN
2.0000 mg | INTRAMUSCULAR | Status: DC | PRN
  Administered 2011-10-24 – 2011-10-27 (×3): 2 mg via INTRAVENOUS
  Filled 2011-10-24 (×2): qty 2
  Filled 2011-10-24: qty 4
  Filled 2011-10-24: qty 2

## 2011-10-24 MED ORDER — FUROSEMIDE 10 MG/ML IJ SOLN
40.0000 mg | Freq: Four times a day (QID) | INTRAMUSCULAR | Status: AC
  Administered 2011-10-24 – 2011-10-25 (×3): 40 mg via INTRAVENOUS
  Filled 2011-10-24 (×3): qty 4

## 2011-10-24 MED ORDER — OLANZAPINE 10 MG PO TABS
10.0000 mg | ORAL_TABLET | Freq: Every day | ORAL | Status: DC
  Administered 2011-10-25 – 2011-10-26 (×3): 10 mg via ORAL
  Filled 2011-10-24 (×5): qty 1

## 2011-10-24 MED ORDER — BENZTROPINE MESYLATE 1 MG PO TABS
1.5000 mg | ORAL_TABLET | Freq: Two times a day (BID) | ORAL | Status: DC
  Administered 2011-10-25 – 2011-11-02 (×17): 1.5 mg via ORAL
  Filled 2011-10-24 (×21): qty 1

## 2011-10-24 MED ORDER — MAGNESIUM SULFATE 40 MG/ML IJ SOLN
2.0000 g | Freq: Once | INTRAMUSCULAR | Status: AC
  Administered 2011-10-24: 2 g via INTRAVENOUS
  Filled 2011-10-24: qty 50

## 2011-10-24 MED ORDER — FENTANYL CITRATE 0.05 MG/ML IJ SOLN
50.0000 ug | INTRAMUSCULAR | Status: DC | PRN
  Administered 2011-10-24: 100 ug via INTRAVENOUS
  Administered 2011-10-24 (×2): 50 ug via INTRAVENOUS
  Administered 2011-10-24 – 2011-10-25 (×2): 100 ug via INTRAVENOUS
  Filled 2011-10-24 (×5): qty 2

## 2011-10-24 MED ORDER — POTASSIUM CHLORIDE 20 MEQ/15ML (10%) PO LIQD
40.0000 meq | Freq: Three times a day (TID) | ORAL | Status: AC
  Administered 2011-10-24 – 2011-10-25 (×3): 40 meq
  Filled 2011-10-24 (×3): qty 30

## 2011-10-24 NOTE — Progress Notes (Signed)
eLink Physician-Brief Progress Note Patient Name: Emily Cordova DOB: [birth year] MRN: [id number]  Date of Service  10/24/2011   HPI/Events of Note  Called by RN re: vent dyssynchrony despite RASS -4. Also frothy secretions per ETT. Lasix and KCl already ordered   eICU Interventions  Vent changed to PCV mode for synchrony. IVFs adjusted      Billy Fischer 10/24/2011, 4:25 PM

## 2011-10-24 NOTE — Procedures (Signed)
Second dialysis treatment for Lithium toxicity due due elevated level.  Treatment is without complications. Fever is present and treated with acetaminophen. Will re check lithium level tonight. Katilynn Sinkler C

## 2011-10-24 NOTE — Progress Notes (Signed)
ANTIBIOTIC CONSULT NOTE - INITIAL  Pharmacy Consult for Unasyn Indication: aspiration pneumonia   Allergies  Allergen Reactions  . Etomidate Other (See Comments)    Severe Myoclonus: SE  . Haldol (Haloperidol Decanoate)     Patient Measurements: Height: 5\' 2"  (157.5 cm) Weight: 253 lb 8.5 oz (115 kg) IBW/kg (Calculated) : 50.1   Vital Signs: Temp: 99.3 F (37.4 C) (08/20 0400) Temp src: Oral (08/20 0400) BP: 146/74 mmHg (08/20 0900) Pulse Rate: 72  (08/20 0900) Intake/Output from previous day: 08/19 0701 - 08/20 0700 In: 6244.4 [I.V.:6154.4; NG/GT:90] Out: 975 [Urine:875; Stool:100] Intake/Output from this shift: Total I/O In: 563.2 [I.V.:563.2] Out: 180 [Urine:180]  Labs:  Basename 10/24/11 0418 10/23/11 0930 10/23/11 0500 10/23/11 0240 10/22/11 0914  WBC 15.2* -- 20.1* -- 12.9*  HGB 10.2* -- 11.0* -- 12.6  PLT 264 -- 294 -- 333  LABCREA -- -- -- -- --  CREATININE 1.33* 1.14* -- 1.85* --   Estimated Creatinine Clearance: 66.2 ml/min (by C-G formula based on Cr of 1.33). No results found for this basename: VANCOTROUGH:2,VANCOPEAK:2,VANCORANDOM:2,GENTTROUGH:2,GENTPEAK:2,GENTRANDOM:2,TOBRATROUGH:2,TOBRAPEAK:2,TOBRARND:2,AMIKACINPEAK:2,AMIKACINTROU:2,AMIKACIN:2, in the last 72 hours   Microbiology: Recent Results (from the past 720 hour(s))  CULTURE, BLOOD (ROUTINE X 2)     Status: Normal (Preliminary result)   Collection Time   10/22/11  9:44 AM      Component Value Range Status Comment   Specimen Description BLOOD LEFT HAND   Final    Special Requests     Final    Value: BOTTLES DRAWN AEROBIC AND ANAEROBIC 6CC EACH BOTTLE   Culture NO GROWTH 2 DAYS   Final    Report Status PENDING   Incomplete   CULTURE, BLOOD (ROUTINE X 2)     Status: Normal (Preliminary result)   Collection Time   10/22/11  9:47 AM      Component Value Range Status Comment   Specimen Description BLOOD LEFT ARM   Final    Special Requests BOTTLES DRAWN AEROBIC ONLY 8CC BOTTLE   Final    Culture NO GROWTH 2 DAYS   Final    Report Status PENDING   Incomplete   URINE CULTURE     Status: Normal   Collection Time   10/22/11 10:12 AM      Component Value Range Status Comment   Specimen Description URINE, CATHETERIZED   Final    Special Requests NONE   Final    Culture  Setup Time 10/22/2011 21:27   Final    Colony Count NO GROWTH   Final    Culture NO GROWTH   Final    Report Status 10/24/2011 FINAL   Final   CLOSTRIDIUM DIFFICILE BY PCR     Status: Normal   Collection Time   10/22/11  6:14 PM      Component Value Range Status Comment   C difficile by pcr NEGATIVE  NEGATIVE Final   STOOL CULTURE     Status: Normal (Preliminary result)   Collection Time   10/22/11  6:14 PM      Component Value Range Status Comment   Specimen Description STOOL   Final    Special Requests NONE   Final    Culture Culture reincubated for better growth   Final    Report Status PENDING   Incomplete   MRSA PCR SCREENING     Status: Normal   Collection Time   10/22/11 11:38 PM      Component Value Range Status Comment   MRSA by PCR NEGATIVE  NEGATIVE Final   CLOSTRIDIUM DIFFICILE BY PCR     Status: Normal   Collection Time   10/23/11 10:04 AM      Component Value Range Status Comment   C difficile by pcr NEGATIVE  NEGATIVE Final     Medical History: Past Medical History  Diagnosis Date  . Schizophrenia   . Bipolar disorder   . Diabetes mellitus   . Hypercholesteremia   . Hypertension   . Kidney stone     Medications:  Scheduled:    . antiseptic oral rinse  15 mL Mouth Rinse QID  . benztropine  1.5 mg Oral BID  . chlorhexidine  15 mL Mouth Rinse BID  . furosemide  40 mg Intravenous Q6H  . heparin  5,000 Units Subcutaneous Q8H  . insulin aspart  1-4 Units Subcutaneous Q4H  . magnesium sulfate 1 - 4 g bolus IVPB  2 g Intravenous Once  . OLANZapine  10 mg Oral QHS  . pantoprazole (PROTONIX) IV  40 mg Intravenous Q24H  . potassium chloride  40 mEq Per Tube TID  . white petrolatum       . DISCONTD: insulin aspart  1-4 Units Subcutaneous Q4H   Assessment: Patient is 41 year old female who has been intubated for 2 days. She had temperature spiking at 101.1 and WBC count of 15.2 (down from 20.1). Chest x-ray shows worsening left lower lobe aeration, believed to be aspiration pneumonia.  Renal function good with CrCl ~24ml/min.   Goal of Therapy:  Eradicate infection  Plan:  Initiate Unasyn 3g every 6 hours  Follow up length of therapy   Micheline Chapman PharmD Candidate  10/24/2011,10:40 AM   Addendum: I have discussed the patient with rebecca and agree with the assessment and plan. Will also f/u renal function, clinical course, fever curve, and any culture results.  Thank you for the consult.  Tomi Bamberger, PharmD Clinical Pharmacist Pager: [phone] Pharmacy: [phone] 10/24/2011 1:54 PM

## 2011-10-24 NOTE — Progress Notes (Signed)
INITIAL ADULT NUTRITION ASSESSMENT Date: 10/24/2011   Time: 2:25 PM  Reason for Assessment: MD Consult for Enteral Nutrition  ASSESSMENT: Female 41 y.o.  Dx: Lithium toxicity  Hx:  Past Medical History  Diagnosis Date  . Schizophrenia   . Bipolar disorder   . Diabetes mellitus   . Hypercholesteremia   . Hypertension   . Kidney stone    Past Surgical History  Procedure Date  . Cholecystectomy   . Tubal ligation    Related Meds:     . ampicillin-sulbactam (UNASYN) IV  3 g Intravenous Q6H  . antiseptic oral rinse  15 mL Mouth Rinse QID  . benztropine  1.5 mg Oral BID  . chlorhexidine  15 mL Mouth Rinse BID  . furosemide  40 mg Intravenous Q6H  . heparin  5,000 Units Subcutaneous Q8H  . insulin aspart  1-4 Units Subcutaneous Q4H  . magnesium sulfate 1 - 4 g bolus IVPB  2 g Intravenous Once  . OLANZapine  10 mg Oral QHS  . pantoprazole (PROTONIX) IV  40 mg Intravenous Q24H  . potassium chloride  40 mEq Per Tube TID  . white petrolatum      . DISCONTD: insulin aspart  1-4 Units Subcutaneous Q4H   Ht: 5\' 2"  (157.5 cm)  Wt: 261 lb 14.5 oz (118.8 kg)  Ideal Wt: 50 kg % Ideal Wt: 238%  Wt Readings from Last 15 Encounters:  10/24/11 261 lb 14.5 oz (118.8 kg)  06/11/11 245 lb (111.131 kg)  06/05/11 245 lb (111.131 kg)  06/05/11 245 lb (111.131 kg)  05/30/11 250 lb (113.399 kg)  Usual Wt: 245 - 250 lb % Usual Wt: 105%  Body mass index is 47.90 kg/(m^2). Pt is obese, class III, extreme.  Food/Nutrition Related Hx: unable to obtain at this time  Labs:  CMP     Component Value Date/Time   NA 141 10/24/2011 0418   K 3.4* 10/24/2011 0418   CL 106 10/24/2011 0418   CO2 20 10/24/2011 0418   GLUCOSE 195* 10/24/2011 0418   BUN 19 10/24/2011 0418   CREATININE 1.33* 10/24/2011 0418   CALCIUM 7.1* 10/24/2011 0418   PROT 6.7 10/22/2011 1000   ALBUMIN 3.7 10/22/2011 1000   AST 14 10/22/2011 1000   ALT 17 10/23/2011 0240   ALKPHOS 196* 10/22/2011 1000   BILITOT 0.3 10/22/2011  1000   GFRNONAA 49* 10/24/2011 0418   GFRAA 56* 10/24/2011 0418   Sodium  Date/Time Value Range Status  10/24/2011  4:18 AM 141  135 - 145 mEq/L Final  10/23/2011  9:30 AM 138  135 - 145 mEq/L Final  10/22/2011  9:14 AM 134* 135 - 145 mEq/L Final    Potassium  Date/Time Value Range Status  10/24/2011  4:18 AM 3.4* 3.5 - 5.1 mEq/L Final  10/23/2011  9:30 AM 3.5  3.5 - 5.1 mEq/L Final  10/22/2011  9:14 AM 3.5  3.5 - 5.1 mEq/L Final    Phosphorus  Date/Time Value Range Status  10/24/2011  4:18 AM 2.6  2.3 - 4.6 mg/dL Final  1/61/0960 45:40 AM 2.6  2.3 - 4.6 mg/dL Final    Magnesium  Date/Time Value Range Status  10/24/2011  4:18 AM 1.4* 1.5 - 2.5 mg/dL Final    Intake/Output Summary (Last 24 hours) at 10/24/11 1427 Last data filed at 10/24/11 1235  Gross per 24 hour  Intake 5486.33 ml  Output   1740 ml  Net 3746.33 ml   Diet Order: NPO  Supplements/Tube  Feeding: none  IVF:    sodium chloride Last Rate: 1,000 mL (10/24/11 1139)  sodium chloride Last Rate: Stopped (10/24/11 1152)  propofol Last Rate: Stopped (10/24/11 1341)   Estimated Nutritional Needs:   Kcal: 2286 kcal   Underfeeding kcal goal (per ASPEN underfeeding guidelines): 1370 - 1600 kcal Protein:  125 - 150 grams protein Fluid: 1.8 - 2 liters daily  Presented to outside hospital with lithium toxicity. Transferred to [facility] to initiate HD for lithium toxicity. Per chart, has had diarrhea for 4 days PTA. C diff negative.  Pt is currently intubated and sedated at this time. RN reports that propofol is currently off.  Temp (24hrs), Avg:99.8 F (37.7 C), Min:98.7 F (37.1 C), Max:101.1 F (38.4 C)  MVe: 11.4  NUTRITION DIAGNOSIS: -Inadequate oral intake (NI-2.1).  Status: Ongoing  RELATED TO: inability to eat  AS EVIDENCE BY: NPO status  MONITORING/EVALUATION(Goals): Goal: Enteral nutrition to provide 60-70% of estimated calorie needs (22-25 kcals/kg ideal body weight) and >/= 90% of estimated protein needs,  based on ASPEN guidelines for permissive underfeeding in critically ill obese individuals. Monitor: weights, labs, PO intake, I/O's  EDUCATION NEEDS: -No education needs identified at this time  INTERVENTION: 1. Initiate enteral nutrition of Promote at 20 ml/hr and advance by 10 ml/hr every 4 hours to goal of 40 ml/hr. 30 ml Prostat liquid protein via tube 5 times daily. Total enteral nutrition regimen will provide: 1460 kcal, 135 grams protein, 805 ml free water.  2. RD to continue to follow nutrition care plan   DOCUMENTATION CODES Per approved criteria  -Morbid Obesity   Jarold Motto MS, RD, LDN Pager: [phone] After-hours pager: [phone]

## 2011-10-24 NOTE — Progress Notes (Signed)
[facility] ADULT ICU REPLACEMENT PROTOCOL FOR AM LAB REPLACEMENT ONLY  The patient does not apply for the [facility] Adult ICU Electrolyte Replacment Protocol based on the criteria listed below:    2. Is urine output >/= 0.5 ml/kg/hr for the last 8 hours? no Patient's UOP is .4 ml/kg/hr  6. If a panic level lab has been reported, has the CCM MD in charge been notified? yes.   Physician:  Dr Karin Golden, Clarnce Flock 10/24/2011 5:13 AM

## 2011-10-24 NOTE — Progress Notes (Signed)
Assessment:  Lithium Toxicity s/p dialysis                         Acute Renal Failure Plan:               Re check level and remove dialysis catheter prn    Subjective: Interval History: Intubated  Objective: Vital signs in last 24 hours: Temp:  [98.7 F (37.1 C)-101.1 F (38.4 C)] 99.3 F (37.4 C) (08/20 0400) Pulse Rate:  [61-79] 72  (08/20 0750) Resp:  [0-40] 38  (08/20 0750) BP: (91-137)/(37-63) 119/58 mmHg (08/20 0750) SpO2:  [97 %-100 %] 100 % (08/20 0750) FiO2 (%):  [39.5 %-50.3 %] 40 % (08/20 0750) Weight:  [114.4 kg (252 lb 3.3 oz)-115 kg (253 lb 8.5 oz)] 115 kg (253 lb 8.5 oz) (08/20 0500) Weight change: -2.6 kg (-5 lb 11.7 oz)  Intake/Output from previous day: 08/19 0701 - 08/20 0700 In: 6244.4 [I.V.:6154.4; NG/GT:90] Out: 975 [Urine:875; Stool:100] Intake/Output this shift:    General appearance: intubated and not responsive Neurologic: sedated  Lab Results:  Basename 10/24/11 0418 10/23/11 0500  WBC 15.2* 20.1*  HGB 10.2* 11.0*  HCT 31.6* 34.0*  PLT 264 294   BMET:  Basename 10/24/11 0418 10/23/11 0930  NA 141 138  K 3.4* 3.5  CL 106 101  CO2 20 22  GLUCOSE 195* 162*  BUN 19 13  CREATININE 1.33* 1.14*  CALCIUM 7.1* 7.5*   Lithium level: 1.67 yesterday No results found for this basename: PTH:2 in the last 72 hours Iron Studies: No results found for this basename: IRON,TIBC,TRANSFERRIN,FERRITIN in the last 72 hours Studies/Results: Dg Chest Port 1 View  10/24/2011  *RADIOLOGY REPORT*  Clinical Data: Endotracheal tube placement.  PORTABLE CHEST - 1 VIEW  Comparison: 10/23/2011  Findings: Endotracheal tube remains appropriately position, 2.8 cm above carina.  Nasogastric tube extends beyond the  inferior aspect of the film.  Large bore right IJ line is unchanged with tip at low SVC.  Numerous leads and wires project over the chest.  Cardiomegaly accentuated by AP portable technique.  Cannot exclude small layering left pleural effusion. No  pneumothorax.  Lung volumes extremely low.  This accentuates the pulmonary interstitium. Patchy right-sided atelectasis is similar.  There is worsened left base aeration.  Similar mild interstitial edema.  IMPRESSION:  1.  Worsening left lower lobe aeration.  Cannot exclude developing infection or aspiration. 2.  Low lung volumes with similar mild interstitial edema.   Original Report Authenticated By: Consuello Bossier, M.D.    Dg Chest Port 1 View  10/23/2011  *RADIOLOGY REPORT*  Clinical Data: Endotracheal tube placement.  PORTABLE CHEST - 1 VIEW  Comparison: 10/22/2011  Findings: Shallow inspiration.  Mild cardiac enlargement with increasing pulmonary vascular congestion since previous study. Linear atelectasis in the mid lungs.  There has been interval placement of an endotracheal tube with tip about 2.7 cm above the carina.  A right central venous catheter has been placed with the tip projected over the mid SVC.  No pneumothorax.  No blunting of costophrenic angles.  IMPRESSION: Developing pulmonary vascular congestion and linear atelectasis in the lungs.  Endotracheal tube tip is about 2.7 cm above the carina. Right central venous catheter tip is projected over the mid SVC.  Original Report Authenticated By: Marlon Pel, M.D.   Dg Chest Port 1 View  10/22/2011  *RADIOLOGY REPORT*  Clinical Data: Chest congestion.  Altered mental status. Persistent shaking.  PORTABLE CHEST - 1 VIEW  Comparison: 05/15/2010  Findings: Mildly degraded exam due to AP portable technique and patient body habitus.  Midline trachea.  Normal heart size for level of inspiration.  No congestive failure.  No pleural effusion or pneumothorax.  Mildly low lung volumes, without focal opacity.  IMPRESSION: Cardiomegaly and low lung volumes, without acute disease.  Original Report Authenticated By: Consuello Bossier, M.D.    Scheduled:   . antiseptic oral rinse  15 mL Mouth Rinse QID  . chlorhexidine  15 mL Mouth Rinse BID  .  heparin  5,000 Units Subcutaneous Q8H  . insulin aspart  1-4 Units Subcutaneous Q4H  . pantoprazole (PROTONIX) IV  40 mg Intravenous Q24H  . white petrolatum      . DISCONTD: insulin aspart  1-4 Units Subcutaneous Q4H       LOS: 2 days   Emily Cordova C 10/24/2011,8:29 AM

## 2011-10-24 NOTE — Progress Notes (Signed)
Name: Emily Cordova MRN: [id number] DOB: 03-Jan-1971    LOS: 2  Referring Provider:  OSH Reason for Referral:  Lithium toxicity need for dialysis  PULMONARY / CRITICAL CARE MEDICINE  HPI:  Emily Cordova is a 41 y/o woman with an extensive past psychiatric history who presented to an outside hospital with altered behavior.  She was found to have a lithium level of 4 and was transferred to [facility] to initiate dialysis.  According to her daughter her psychiatrist has been titrating her psychiatric medications recently.  Upon arrival in the ICU she will briefly open her eyes to physical stimulus but will not maintain alertness.   Brief patient description:  41 y/o obese pt with lithium overdose requiring dialyis  Events Since Admission: Intubation 8/18 for AMS Dialysis catheter placement RIJ 8/18  Current Status: Critical  Vital Signs: Temp:  [98.7 F (37.1 C)-101.1 F (38.4 C)] 99.3 F (37.4 C) (08/20 0400) Pulse Rate:  [61-79] 72  (08/20 0900) Resp:  [0-40] 35  (08/20 0900) BP: (91-146)/(37-74) 146/74 mmHg (08/20 0900) SpO2:  [97 %-100 %] 100 % (08/20 0900) FiO2 (%):  [39.5 %-40.4 %] 40.4 % (08/20 0900) Weight:  [115 kg (253 lb 8.5 oz)] 115 kg (253 lb 8.5 oz) (08/20 0500)  Physical Examination: General:  Obese woman, non-responsive. Neuro:  PERRL, opens eyes to physical stimuli HEENT: [place]/AT, PERRL, EOM-I and MMM. Neck:  supple Cardiovascular:  NRRR, II/VII systolic murmur, loudest at LUSB Lungs:  CTAB, no wrr Abdomen:  Obese, NTND, +BS, no appreciable HSM Musculoskeletal:  No joint abnormalities, no c/c/e Skin:  No rash  Principal Problem:  *Lithium toxicity Active Problems:  Bipolar 1 disorder  Altered mental status  Acute respiratory failure  Hypoxemia  ASSESSMENT AND PLAN  PULMONARY  Lab 10/24/11 0319 10/23/11 0331 10/22/11 2347  PHART 7.361 7.322* 7.360  PCO2ART 30.9* 46.3* 38.3  PO2ART 101.0* 120.0* 77.0*  HCO3 17.4* 23.9 21.6  O2SAT 98.0 98.0 95.0    Ventilator Settings: Vent Mode:  [-] PRVC FiO2 (%):  [39.5 %-40.4 %] 40.4 % Set Rate:  [18 bmp] 18 bmp Vt Set:  [410 mL] 410 mL PEEP:  [5 cmH20] 5 cmH20 Plateau Pressure:  [21 cmH20-26 cmH20] 26 cmH20 CXR:  ETT in good position, dialysis catheter in RIJ in good position, no obvious infiltrates ETT:  7.5 at 24 at the lip 8/18>>>  A:  Intubated for airway protection secondary to altered mental status, ?aspiration PNA with fever. P:   Airway protective ventilation and hypoxemia. Decreased FiO2 to 40 and PEEP to 5 and tolerating. Diureses today and attempt PS with SBT in AM. Start Unasyn per pharmacy.  CARDIOVASCULAR  Lab 10/22/11 0947  TROPONINI <0.30  LATICACIDVEN --  PROBNP --   ECG:  Sinus rhythm Lines: RIJ trialysis catheter 8/18>>>  A: Normal sinus rhythm, normotensive P:  Continuous cardio-pulmonary monitoring  RENAL  Lab 10/24/11 0418 10/23/11 1045 10/23/11 0930 10/23/11 0240 10/22/11 0914  NA 141 -- 138 -- 134*  K 3.4* -- 3.5 -- --  CL 106 -- 101 -- 102  CO2 20 -- 22 -- 23  BUN 19 -- 13 -- 31*  CREATININE 1.33* -- 1.14* 1.85* 1.79*  CALCIUM 7.1* -- 7.5* -- 11.0*  MG 1.4* -- -- -- --  PHOS 2.6 2.6 -- -- --   Intake/Output      08/19 0701 - 08/20 0700 08/20 0701 - 08/21 0700   I.V. (mL/kg) 6154.4 (53.5) 563.2 (4.9)   NG/GT 90  Total Intake(mL/kg) 6244.4 (54.3) 563.2 (4.9)   Urine (mL/kg/hr) 875 (0.3) 180   Stool 100    Total Output 975 180   Net +5269.4 +383.2         Foley:  In place 10/22/11  A:  ARF 2/2 lithium toxicity requiring dialysis for lithium removal P:   Dialysis catheter placed. HD completed will defer to psych. Defer to renal for next dialysis session and lithium levels.  GASTROINTESTINAL  Lab 10/23/11 0240 10/22/11 1000  AST -- 14  ALT 17 23  ALKPHOS -- 196*  BILITOT -- 0.3  PROT -- 6.7  ALBUMIN -- 3.7    A:  History of diarrhea for the last 4 days P:   No recent history of antibiotics to suggest C. Dif Stool for C.  Diff diff.   HEMATOLOGIC  Lab 10/24/11 0418 10/23/11 0500 10/23/11 0240 10/22/11 0914  HGB 10.2* 11.0* -- 12.6  HCT 31.6* 34.0* -- 37.5  PLT 264 294 -- 333  INR -- -- 1.03 --  APTT -- -- 27 --   A:  No current problems P:  Follow daily H/H, platelets. Heparin for DVT prophylaxis.  INFECTIOUS  Lab 10/24/11 0418 10/23/11 0500 10/22/11 0914  WBC 15.2* 20.1* 12.9*  PROCALCITON -- -- --   Cultures: Blood 8/20>>> Sputum 8/20>>> Urine 8/20>>>  Antibiotics: Unasyn 8/20>>>  A:  ? Aspiration PNA with obscurity of the left heart border. P:   Unasyn. F/U on culture.  ENDOCRINE  Lab 10/24/11 0412 10/23/11 2351 10/23/11 2024 10/23/11 1620 10/23/11 1212  GLUCAP 175* 151* 130* 159* 177*   A:  DM previously on metformin   P:   Stop metformin with elevated creatinine (contraindicated if Cr >1.4 in women)  NEUROLOGIC  A:  AMS, psych history P:   Lithium toxicity level from 1.6 to 1.72. Once extubated consult with psychiatry for help with psych meds.  These had been titrated by patients psychiatrist recently. Restart zyprexa and congentin. D/C propofol and start intermittent sedation protocol.  CC time 45 min.  Alyson Reedy, M.D. [facility] Pulmonary/Critical Care Medicine. Pager: [phone]. After hours pager: [phone].

## 2011-10-25 ENCOUNTER — Inpatient Hospital Stay (HOSPITAL_COMMUNITY): Payer: Medicaid Other

## 2011-10-25 DIAGNOSIS — F319 Bipolar disorder, unspecified: Secondary | ICD-10-CM

## 2011-10-25 LAB — BASIC METABOLIC PANEL
BUN: 12 mg/dL (ref 6–23)
CO2: 28 mEq/L (ref 19–32)
Chloride: 103 mEq/L (ref 96–112)
GFR calc non Af Amer: 90 mL/min — ABNORMAL LOW (ref >90)
Glucose, Bld: 156 mg/dL — ABNORMAL HIGH (ref 70–99)
Potassium: 3.9 mEq/L (ref 3.5–5.1)
Sodium: 139 mEq/L (ref 135–145)

## 2011-10-25 LAB — CBC
HCT: 33 % — ABNORMAL LOW (ref 36.0–46.0)
Hemoglobin: 10.7 g/dL — ABNORMAL LOW (ref 12.0–15.0)
RBC: 3.57 MIL/uL — ABNORMAL LOW (ref 3.87–5.11)
WBC: 15.8 10*3/uL — ABNORMAL HIGH (ref 4.0–10.5)

## 2011-10-25 LAB — LITHIUM LEVEL: Lithium Lvl: 0.78 mEq/L — ABNORMAL LOW (ref 0.80–1.40)

## 2011-10-25 LAB — GLUCOSE, CAPILLARY
Glucose-Capillary: 203 mg/dL — ABNORMAL HIGH (ref 70–99)
Glucose-Capillary: 277 mg/dL — ABNORMAL HIGH (ref 70–99)
Glucose-Capillary: 320 mg/dL — ABNORMAL HIGH (ref 70–99)

## 2011-10-25 LAB — PHOSPHORUS: Phosphorus: 1 mg/dL — CL (ref 2.3–4.6)

## 2011-10-25 MED ORDER — K PHOS MONO-SOD PHOS DI & MONO 155-852-130 MG PO TABS: 500.0000 mg | ORAL_TABLET | Freq: Two times a day (BID) | ORAL | Status: DC

## 2011-10-25 MED ORDER — INSULIN GLARGINE 100 UNIT/ML ~~LOC~~ SOLN
10.0000 [IU] | Freq: Every day | SUBCUTANEOUS | Status: DC
  Administered 2011-10-26 (×2): 10 [IU] via SUBCUTANEOUS

## 2011-10-25 MED ORDER — POTASSIUM CHLORIDE 20 MEQ/15ML (10%) PO LIQD
40.0000 meq | Freq: Three times a day (TID) | ORAL | Status: AC
  Administered 2011-10-25 (×3): 40 meq
  Filled 2011-10-25 (×3): qty 30

## 2011-10-25 MED ORDER — MAGNESIUM SULFATE 40 MG/ML IJ SOLN
2.0000 g | Freq: Once | INTRAMUSCULAR | Status: AC
  Administered 2011-10-25: 2 g via INTRAVENOUS
  Filled 2011-10-25: qty 50

## 2011-10-25 MED ORDER — INSULIN ASPART 100 UNIT/ML ~~LOC~~ SOLN
0.0000 [IU] | SUBCUTANEOUS | Status: DC
  Administered 2011-10-25 (×3): 4 [IU] via SUBCUTANEOUS

## 2011-10-25 MED ORDER — POTASSIUM & SODIUM PHOSPHATES 280-160-250 MG PO PACK
2.0000 | PACK | Freq: Two times a day (BID) | ORAL | Status: AC
  Administered 2011-10-25 – 2011-10-26 (×4): 2
  Filled 2011-10-25 (×4): qty 2

## 2011-10-25 MED ORDER — POTASSIUM CHLORIDE 20 MEQ/15ML (10%) PO LIQD
ORAL | Status: AC
  Filled 2011-10-25: qty 30

## 2011-10-25 MED ORDER — INSULIN ASPART 100 UNIT/ML ~~LOC~~ SOLN
0.0000 [IU] | SUBCUTANEOUS | Status: DC
  Administered 2011-10-25 – 2011-10-26 (×2): 15 [IU] via SUBCUTANEOUS
  Administered 2011-10-26: 3 [IU] via SUBCUTANEOUS
  Administered 2011-10-26: 4 [IU] via SUBCUTANEOUS
  Administered 2011-10-26: 11 [IU] via SUBCUTANEOUS
  Administered 2011-10-26 – 2011-10-27 (×3): 15 [IU] via SUBCUTANEOUS
  Administered 2011-10-27: 7 [IU] via SUBCUTANEOUS
  Administered 2011-10-27: 15 [IU] via SUBCUTANEOUS
  Administered 2011-10-27 – 2011-10-28 (×6): 11 [IU] via SUBCUTANEOUS
  Administered 2011-10-28 – 2011-10-29 (×4): 7 [IU] via SUBCUTANEOUS
  Administered 2011-10-29: 11 [IU] via SUBCUTANEOUS
  Administered 2011-10-29: 7 [IU] via SUBCUTANEOUS
  Administered 2011-10-29: 11 [IU] via SUBCUTANEOUS

## 2011-10-25 MED ORDER — FUROSEMIDE 10 MG/ML IJ SOLN
40.0000 mg | Freq: Four times a day (QID) | INTRAMUSCULAR | Status: AC
  Administered 2011-10-25 (×3): 40 mg via INTRAVENOUS
  Filled 2011-10-25 (×3): qty 4

## 2011-10-25 MED ORDER — SODIUM PHOSPHATE 3 MMOLE/ML IV SOLN
30.0000 mmol | Freq: Once | INTRAVENOUS | Status: AC
  Administered 2011-10-25: 30 mmol via INTRAVENOUS
  Filled 2011-10-25: qty 10

## 2011-10-25 MED ORDER — DEXTROSE 10 % IV SOLN: INTRAVENOUS | Status: DC | PRN

## 2011-10-25 MED ORDER — INSULIN GLARGINE 100 UNIT/ML ~~LOC~~ SOLN
10.0000 [IU] | Freq: Every day | SUBCUTANEOUS | Status: DC
  Administered 2011-10-25: 10 [IU] via SUBCUTANEOUS

## 2011-10-25 MED ORDER — PANTOPRAZOLE SODIUM 40 MG PO PACK
40.0000 mg | PACK | Freq: Every day | ORAL | Status: DC
  Administered 2011-10-25 – 2011-10-26 (×2): 40 mg
  Filled 2011-10-25 (×4): qty 20

## 2011-10-25 MED ORDER — ACETAMINOPHEN 160 MG/5ML PO SOLN
650.0000 mg | ORAL | Status: DC | PRN
  Administered 2011-10-25 – 2011-10-27 (×2): 650 mg
  Filled 2011-10-25 (×3): qty 20.3

## 2011-10-25 NOTE — Progress Notes (Signed)
Name: Emily Cordova MRN: [id number] DOB: 1971/03/01    LOS: 3  Referring Provider:  OSH Reason for Referral:  Lithium toxicity need for dialysis  PULMONARY / CRITICAL CARE MEDICINE  HPI:  Ms. Raboin is a 41 y/o woman with an extensive past psychiatric history who presented to an outside hospital with altered behavior.  She was found to have a lithium level of 4 and was transferred to [facility] to initiate dialysis.  According to her daughter her psychiatrist has been titrating her psychiatric medications recently.  Upon arrival in the ICU she will briefly open her eyes to physical stimulus but will not maintain alertness.   Brief patient description:  41 y/o obese pt with lithium overdose requiring dialyis  Events Since Admission: Intubation 8/18 for AMS Dialysis catheter placement RIJ 8/18  Current Status: Critical  Vital Signs: Temp:  [99.4 F (37.4 C)-101 F (38.3 C)] 100.4 F (38 C) (08/21 0739) Pulse Rate:  [64-104] 74  (08/21 0500) Resp:  [0-35] 21  (08/21 0500) BP: (103-184)/(48-127) 107/59 mmHg (08/21 0500) SpO2:  [94 %-100 %] 100 % (08/21 0500) FiO2 (%):  [39.5 %-40.7 %] 40.2 % (08/21 0500) Weight:  [116.6 kg (257 lb 0.9 oz)-118.8 kg (261 lb 14.5 oz)] 116.6 kg (257 lb 0.9 oz) (08/21 0500)  Physical Examination: General:  Obese woman, non-responsive. Neuro:  PERRL, opens eyes to physical stimuli HEENT: [place]/AT, PERRL, EOM-I and MMM. Neck:  supple Cardiovascular:  NRRR, II/VII systolic murmur, loudest at LUSB Lungs:  CTAB, no wrr Abdomen:  Obese, NTND, +BS, no appreciable HSM Musculoskeletal:  No joint abnormalities, no c/c/e Skin:  No rash  Principal Problem:  *Lithium toxicity Active Problems:  Bipolar 1 disorder  Altered mental status  Acute respiratory failure  Hypoxemia  ASSESSMENT AND PLAN  PULMONARY  Lab 10/24/11 0319 10/23/11 0331 10/22/11 2347  PHART 7.361 7.322* 7.360  PCO2ART 30.9* 46.3* 38.3  PO2ART 101.0* 120.0* 77.0*  HCO3 17.4* 23.9 21.6   O2SAT 98.0 98.0 95.0   Ventilator Settings: Vent Mode:  [-] PCV FiO2 (%):  [39.5 %-40.7 %] 40.2 % Set Rate:  [18 bmp] 18 bmp Vt Set:  [410 mL] 410 mL PEEP:  [5 cmH20-5.3 cmH20] 5.3 cmH20 Plateau Pressure:  [22 cmH20-25 cmH20] 23 cmH20 CXR:  ETT in good position, dialysis catheter in RIJ in good position, no obvious infiltrates ETT:  7.5 at 24 at the lip 8/18>>>  A:  Intubated for airway protection secondary to altered mental status, ?aspiration PNA with fever. P:   Airway protective ventilation and hypoxemia. PS trials after diureses. Start Unasyn per pharmacy.  CARDIOVASCULAR  Lab 10/22/11 0947  TROPONINI <0.30  LATICACIDVEN --  PROBNP --   ECG:  Sinus rhythm Lines: RIJ trialysis catheter 8/18>>>  A: Normal sinus rhythm, normotensive P:  Continuous cardio-pulmonary monitoring.  RENAL  Lab 10/25/11 0340 10/24/11 0418 10/23/11 1045 10/23/11 0930 10/23/11 0240 10/22/11 0914  NA 139 141 -- 138 -- 134*  K 3.9 3.4* -- -- -- --  CL 103 106 -- 101 -- 102  CO2 28 20 -- 22 -- 23  BUN 12 19 -- 13 -- 31*  CREATININE 0.80 1.33* -- 1.14* 1.85* 1.79*  CALCIUM 7.8* 7.1* -- 7.5* -- 11.0*  MG 1.4* 1.4* -- -- -- --  PHOS 1.0* 2.6 2.6 -- -- --   Intake/Output      08/20 0701 - 08/21 0700 08/21 0701 - 08/22 0700   I.V. (mL/kg) 2769 (23.7)    NG/GT 410  IV Piggyback 304    Total Intake(mL/kg) 3483 (29.9)    Urine (mL/kg/hr) 3830 (1.4)    Stool 50    Total Output 3880    Net -397           Intake/Output Summary (Last 24 hours) at 10/25/11 0801 Last data filed at 10/25/11 0600  Gross per 24 hour  Intake 3201.39 ml  Output   3700 ml  Net -498.61 ml   Foley:  In place 10/22/11  A:  ARF 2/2 lithium toxicity requiring dialysis for lithium removal P:   Dialysis catheter placed. HD completed will defer to psych. Defer to renal for next dialysis session and lithium levels.  GASTROINTESTINAL  Lab 10/23/11 0240 10/22/11 1000  AST -- 14  ALT 17 23  ALKPHOS -- 196*    BILITOT -- 0.3  PROT -- 6.7  ALBUMIN -- 3.7    A:  History of diarrhea for the last 4 days P:   No recent history of antibiotics to suggest C. Dif Stool for C. Diff negative  HEMATOLOGIC  Lab 10/25/11 0340 10/24/11 0418 10/23/11 0500 10/23/11 0240 10/22/11 0914  HGB 10.7* 10.2* 11.0* -- 12.6  HCT 33.0* 31.6* 34.0* -- 37.5  PLT 250 264 294 -- 333  INR -- -- -- 1.03 --  APTT -- -- -- 27 --   A:  No current problems P:  Follow daily H/H, platelets. Heparin for DVT prophylaxis.  INFECTIOUS  Lab 10/25/11 0340 10/24/11 0418 10/23/11 0500 10/22/11 0914  WBC 15.8* 15.2* 20.1* 12.9*  PROCALCITON -- -- -- --   Cultures: Blood 8/20>>> Sputum 8/20>>> Urine 8/20>>>  Antibiotics: Unasyn 8/20>>>  A:  ? Aspiration PNA with obscurity of the left heart border. P:   Unasyn. F/U on culture.  ENDOCRINE  Lab 10/25/11 0350 10/24/11 2329 10/24/11 1945 10/24/11 1543 10/24/11 1150  GLUCAP 160* 203* 194* 130* 188*   A:  DM previously on metformin   P:   Stop metformin with elevated creatinine (contraindicated if Cr >1.4 in women)  NEUROLOGIC  A:  AMS, psych history P:   Lithium toxicity level from 0.7. Once extubated consult with psychiatry for help with psych meds.  These had been titrated by patients psychiatrist recently. Restart zyprexa and congentin. Intermittent sedation protocol.  CC time 35 min.  Alyson Reedy, M.D. [facility] Pulmonary/Critical Care Medicine. Pager: [phone]. After hours pager: [phone].

## 2011-10-25 NOTE — Progress Notes (Signed)
It was req that CSW speak to daughters ([place] and [place]) re: visitation and who the medical staff can inform of pt's progression.  CSW called [place] on her cell.  [place] did not want anyone to know about her mother's condition.  CSW proceeded to call [place] Lions who was (prior to admission to [facility]) the primary caregiver for pt.  [place] Lions stated that pt's siblings could and should be updated as far as pt's prognosis.  CSW worked with Charity fundraiser, Thayer Ohm.  [place] Lions completed a visitation form re: who the medical staff can share health information to.  CSW met with daughter, [place] Lions at pt bedside.  [place] Lions was appreciative of CSW involvement. Vickii Penna, LCSWA [phone]  Clinical Social Work

## 2011-10-25 NOTE — Progress Notes (Deleted)
[facility] ADULT ICU REPLACEMENT PROTOCOL FOR AM LAB REPLACEMENT ONLY  The patient does apply for the [facility] Adult ICU Electrolyte Replacment Protocol based on the criteria listed below:   1. Is GFR >/= 50 ml/min? yes  Patient's GFR today is >90 2. Is urine output >/= 0.5 ml/kg/hr for the last 8 hours? yes Patient's UOP is 1 ml/kg/hr 3. Is BUN < 30 mg/dL? yes  Patient's BUN today is 12 4. Abnormal electrolyte(s): Mg 1.4 Phos 1.0 5. Ordered repletion with: Mg and Phos per protocol 6. If a panic level lab has been reported, has the CCM MD in charge been notified? yes.   Physician:  Dr Detterding  Llana Aliment 10/25/2011 4:53 AM

## 2011-10-25 NOTE — Progress Notes (Signed)
Inpatient Diabetes Program Recommendations  AACE/ADA: New Consensus Statement on Inpatient Glycemic Control (2013)  Target Ranges:  Prepandial:   less than 140 mg/dL      Peak postprandial:   less than 180 mg/dL (1-2 hours)      Critically ill patients:  140 - 180 mg/dL    Results for Emily Cordova, Emily Cordova (MRN [id number]) as of 10/25/2011 12:55  Ref. Range 10/24/2011 23:29 10/25/2011 03:50 10/25/2011 07:37 10/25/2011 11:25  Glucose-Capillary Latest Range: 70-99 mg/dL 213 (H) 086 (H) 578 (H) 237 (H)    Patient takes large doses of 70/30 insulin at home: 60 units with breakfast/ 35 units with supper.   The long-acting portion of her 70/30 insulin would be ~67 units NPH insulin.    Inpatient Diabetes Program Recommendations Insulin - Basal: Please consider increasing Lantus to 25 units daily. Insulin - Meal Coverage: Please consider adding tube feed coverage- Novolog 3 units Q4 hours.  Note: Will follow. Ambrose Finland RN, MSN, CDE Diabetes Coordinator Inpatient Diabetes Program [phone]

## 2011-10-25 NOTE — Progress Notes (Signed)
Assessment: Lithium Toxcity, s/p hemodialysis x2.  I suspect we have successfully treated the overdose but will re check a level before removal of catheter advised  Subjective: Interval History: Dialyzed again yesterday for rising Lithium level  Objective: Vital signs in last 24 hours: Temp:  [99.4 F (37.4 C)-101 F (38.3 C)] 100 F (37.8 C) (08/21 0348) Pulse Rate:  [64-104] 74  (08/21 0500) Resp:  [0-38] 21  (08/21 0500) BP: (103-184)/(48-127) 107/59 mmHg (08/21 0500) SpO2:  [94 %-100 %] 100 % (08/21 0500) FiO2 (%):  [39.5 %-40.7 %] 40.2 % (08/21 0500) Weight:  [116.6 kg (257 lb 0.9 oz)-118.8 kg (261 lb 14.5 oz)] 116.6 kg (257 lb 0.9 oz) (08/21 0500) Weight change: 4.4 kg (9 lb 11.2 oz)  Intake/Output from previous day: 08/20 0701 - 08/21 0700 In: 3483 [I.V.:2769; NG/GT:410; IV Piggyback:304] Out: 3880 [Urine:3830; Stool:50] Intake/Output this shift:    General appearance: diaphoretic not responsive to commands; intubated Neurologic: does not open eyes  Lab Results: Lithium level 0.78  Basename 10/25/11 0340 10/24/11 0418  WBC 15.8* 15.2*  HGB 10.7* 10.2*  HCT 33.0* 31.6*  PLT 250 264   BMET:  Basename 10/25/11 0340 10/24/11 0418  NA 139 141  K 3.9 3.4*  CL 103 106  CO2 28 20  GLUCOSE 156* 195*  BUN 12 19  CREATININE 0.80 1.33*  CALCIUM 7.8* 7.1*   No results found for this basename: PTH:2 in the last 72 hours Iron Studies: No results found for this basename: IRON,TIBC,TRANSFERRIN,FERRITIN in the last 72 hours Studies/Results: Dg Chest Port 1 View  10/24/2011  *RADIOLOGY REPORT*  Clinical Data: Endotracheal tube placement.  PORTABLE CHEST - 1 VIEW  Comparison: 10/23/2011  Findings: Endotracheal tube remains appropriately position, 2.8 cm above carina.  Nasogastric tube extends beyond the  inferior aspect of the film.  Large bore right IJ line is unchanged with tip at low SVC.  Numerous leads and wires project over the chest.  Cardiomegaly accentuated by AP  portable technique.  Cannot exclude small layering left pleural effusion. No pneumothorax.  Lung volumes extremely low.  This accentuates the pulmonary interstitium. Patchy right-sided atelectasis is similar.  There is worsened left base aeration.  Similar mild interstitial edema.  IMPRESSION:  1.  Worsening left lower lobe aeration.  Cannot exclude developing infection or aspiration. 2.  Low lung volumes with similar mild interstitial edema.   Original Report Authenticated By: Consuello Bossier, M.D.    Scheduled:   . acetaminophen  650 mg Rectal Once  . ampicillin-sulbactam (UNASYN) IV  3 g Intravenous Q6H  . antiseptic oral rinse  15 mL Mouth Rinse QID  . benztropine  1.5 mg Oral BID  . chlorhexidine  15 mL Mouth Rinse BID  . feeding supplement  30 mL Per Tube 5 X Daily  . furosemide  40 mg Intravenous Q6H  . heparin  5,000 Units Subcutaneous Q8H  . insulin aspart  1-4 Units Subcutaneous Q4H  . magnesium sulfate 1 - 4 g bolus IVPB  2 g Intravenous Once  . magnesium sulfate 1 - 4 g bolus IVPB  2 g Intravenous Once  . OLANZapine  10 mg Oral QHS  . pantoprazole (PROTONIX) IV  40 mg Intravenous Q24H  . potassium chloride  40 mEq Per Tube TID  . potassium chloride      . sodium phosphate  Dextrose 5% IVPB  30 mmol Intravenous Once    LOS: 3 days   Neela Zecca C 10/25/2011,7:36 AM

## 2011-10-25 NOTE — Progress Notes (Signed)
eLink Physician-Brief Progress Note Patient Name: Emily Cordova DOB: 07/24/1970 MRN: [id number]  Date of Service  10/25/2011   HPI/Events of Note  hypophosphatemia   eICU Interventions  Phos replaced   Intervention Category Intermediate Interventions: Electrolyte abnormality - evaluation and management  Larrie Fraizer 10/25/2011, 4:44 AM

## 2011-10-26 ENCOUNTER — Inpatient Hospital Stay (HOSPITAL_COMMUNITY): Payer: Medicaid Other

## 2011-10-26 LAB — BASIC METABOLIC PANEL
BUN: 17 mg/dL (ref 6–23)
Creatinine, Ser: 0.73 mg/dL (ref 0.50–1.10)
GFR calc non Af Amer: >90 mL/min (ref >90)
Glucose, Bld: 116 mg/dL — ABNORMAL HIGH (ref 70–99)
Potassium: 4.2 mEq/L (ref 3.5–5.1)

## 2011-10-26 LAB — URINE CULTURE

## 2011-10-26 LAB — GLUCOSE, CAPILLARY
Glucose-Capillary: 116 mg/dL — ABNORMAL HIGH (ref 70–99)
Glucose-Capillary: 189 mg/dL — ABNORMAL HIGH (ref 70–99)
Glucose-Capillary: 302 mg/dL — ABNORMAL HIGH (ref 70–99)
Glucose-Capillary: 314 mg/dL — ABNORMAL HIGH (ref 70–99)

## 2011-10-26 LAB — POCT I-STAT 3, ART BLOOD GAS (G3+)
Acid-Base Excess: 7 mmol/L — ABNORMAL HIGH (ref 0.0–2.0)
O2 Saturation: 99 %
O2 Saturation: 98 %
pCO2 arterial: 36.6 mmHg (ref 35.0–45.0)
pO2, Arterial: 135 mmHg — ABNORMAL HIGH (ref 80.0–100.0)
pO2, Arterial: 98 mmHg (ref 80.0–100.0)

## 2011-10-26 LAB — CBC
HCT: 33.6 % — ABNORMAL LOW (ref 36.0–46.0)
Hemoglobin: 10.9 g/dL — ABNORMAL LOW (ref 12.0–15.0)
MCH: 30.4 pg (ref 26.0–34.0)
MCHC: 32.4 g/dL (ref 30.0–36.0)
MCV: 93.9 fL (ref 78.0–100.0)

## 2011-10-26 MED ORDER — FREE WATER
200.0000 mL | Freq: Four times a day (QID) | Status: DC
  Administered 2011-10-26 – 2011-10-27 (×5): 200 mL

## 2011-10-26 MED ORDER — SODIUM PHOSPHATE 3 MMOLE/ML IV SOLN
30.0000 mmol | Freq: Once | INTRAVENOUS | Status: AC
  Administered 2011-10-26: 30 mmol via INTRAVENOUS
  Filled 2011-10-26: qty 10

## 2011-10-26 MED ORDER — FUROSEMIDE 10 MG/ML IJ SOLN
INTRAMUSCULAR | Status: AC
  Administered 2011-10-26: 40 mg via INTRAVENOUS
  Filled 2011-10-26: qty 4

## 2011-10-26 MED ORDER — CEFTRIAXONE SODIUM 1 G IJ SOLR
1.0000 g | INTRAMUSCULAR | Status: DC
  Filled 2011-10-26: qty 10

## 2011-10-26 MED ORDER — FUROSEMIDE 10 MG/ML IJ SOLN
40.0000 mg | Freq: Four times a day (QID) | INTRAMUSCULAR | Status: AC
  Administered 2011-10-26 (×3): 40 mg via INTRAVENOUS
  Filled 2011-10-26 (×2): qty 4

## 2011-10-26 MED ORDER — MAGNESIUM SULFATE 40 MG/ML IJ SOLN
2.0000 g | Freq: Once | INTRAMUSCULAR | Status: AC
  Administered 2011-10-26: 2 g via INTRAVENOUS
  Filled 2011-10-26: qty 50

## 2011-10-26 MED ORDER — DEXTROSE 5 % IV SOLN
1.0000 g | INTRAVENOUS | Status: AC
  Administered 2011-10-26 – 2011-10-27 (×2): 1 g via INTRAVENOUS
  Filled 2011-10-26 (×2): qty 10

## 2011-10-26 NOTE — Progress Notes (Signed)
UR Completed.  Emily Cordova [phone] 10/26/2011

## 2011-10-26 NOTE — Progress Notes (Signed)
[facility] ADULT ICU REPLACEMENT PROTOCOL FOR AM LAB REPLACEMENT ONLY  The patient does not apply for the [facility] Adult ICU Electrolyte Replacment Protocol based on the criteria listed below:  4. Abnormal electrolyte(s) Phos- 1.5 Mg- 1.7  Na-148 5. Ordered repletion with: 6. If a panic level lab has been reported, has the CCM MD in charge been notified? yes.   Physician:  Dr Darrick Penna  Llana Aliment 10/26/2011 4:22 AM

## 2011-10-26 NOTE — Progress Notes (Signed)
   She is stable with a lithium level that is not rising. She remains ventilator dependent.  There is no need for further dialysis.  The dialysis catheter can be removed at any time it is not needed for access.  Her sodium is rising and would suggest increased free water administration. We will sign off.  Harjot Dibello C

## 2011-10-26 NOTE — Progress Notes (Signed)
eLink Physician-Brief Progress Note Patient Name: Emily Cordova DOB: Apr 03, 1970 MRN: [id number]  Date of Service  10/26/2011   HPI/Events of Note  Hypophosphatemia and low mag   eICU Interventions  Phos and Mag replaced   Intervention Category Intermediate Interventions: Electrolyte abnormality - evaluation and management  DETERDING,ELIZABETH 10/26/2011, 4:22 AM

## 2011-10-26 NOTE — Progress Notes (Signed)
CRITICAL VALUE ALERT  Critical value received:  Lithium 1.72  Date of notification:  10/24/2011  Time of notification:  1037 AM  Critical value read back:yes  Nurse who received alert:  Corliss Skains RN  MD notified (1st page): Dr. Molli Knock   Time of first page:  1039  MD notified (2nd page):  Time of second page:  Responding MD:  Dr Molli Knock  Time MD responded:  [phone]

## 2011-10-26 NOTE — Progress Notes (Signed)
Name: EVORA SCHECHTER MRN: [id number] DOB: 03/17/1970    LOS: 4  Referring Provider:  OSH Reason for Referral:  Lithium toxicity need for dialysis  PULMONARY / CRITICAL CARE MEDICINE  HPI:  Ms. Kundert is a 40 y/o woman with an extensive past psychiatric history who presented to an outside hospital with altered behavior.  She was found to have a lithium level of 4 and was transferred to [facility] to initiate dialysis.  According to her daughter her psychiatrist has been titrating her psychiatric medications recently.  Upon arrival in the ICU she will briefly open her eyes to physical stimulus but will not maintain alertness.   Brief patient description:  41 y/o obese pt with lithium overdose requiring dialyis  Events Since Admission: Intubation 8/18 for AMS Dialysis catheter placement RIJ 8/18  Current Status: Critical  Vital Signs: Temp:  [98.9 F (37.2 C)-100 F (37.8 C)] 98.9 F (37.2 C) (08/22 0800) Pulse Rate:  [73-84] 79  (08/22 0800) Resp:  [7-24] 18  (08/22 0800) BP: (104-149)/(47-83) 149/83 mmHg (08/22 0800) SpO2:  [97 %-100 %] 100 % (08/22 0800) FiO2 (%):  [39.6 %-40.7 %] 40.3 % (08/22 0800) Weight:  [112.4 kg (247 lb 12.8 oz)] 112.4 kg (247 lb 12.8 oz) (08/22 0300)  Physical Examination: General:  Obese woman, more arousable and following command. Neuro:  PERRL, opens eyes to physical stimuli HEENT: [place]/AT, PERRL, EOM-I and MMM. Neck:  supple Cardiovascular:  NRRR, II/VII systolic murmur, loudest at LUSB Lungs:  CTAB, no wrr Abdomen:  Obese, NTND, +BS, no appreciable HSM Musculoskeletal:  No joint abnormalities, no c/c/e Skin:  No rash  Principal Problem:  *Lithium toxicity Active Problems:  Bipolar 1 disorder  Altered mental status  Acute respiratory failure  Hypoxemia  ASSESSMENT AND PLAN  PULMONARY  Lab 10/26/11 0336 10/25/11 0356 10/24/11 0319 10/23/11 0331 10/22/11 2347  PHART 7.451* 7.462* 7.361 7.322* 7.360  PCO2ART 45.0 36.6 30.9* 46.3* 38.3    PO2ART 98.0 135.0* 101.0* 120.0* 77.0*  HCO3 31.3* 26.0* 17.4* 23.9 21.6  O2SAT 98.0 99.0 98.0 98.0 95.0   Ventilator Settings: Vent Mode:  [-] PCV FiO2 (%):  [39.6 %-40.7 %] 40.3 % Set Rate:  [18 bmp] 18 bmp PEEP:  [5 cmH20] 5 cmH20 Plateau Pressure:  [20 cmH20-23 cmH20] 21 cmH20 CXR:  ETT in good position, dialysis catheter in RIJ in good position, no obvious infiltrates ETT:  7.5 at 24 at the lip 8/18>>>  A:  Intubated for airway protection secondary to altered mental status, ?aspiration PNA with fever. P:   Tolerating PC much better (synchrony issues). Maintain on PS but hold extubation for now until more diuresed. D/C Unasyn and start rocephin per pharmacy. SBT in AM.  CARDIOVASCULAR  Lab 10/22/11 0947  TROPONINI <0.30  LATICACIDVEN --  PROBNP --   ECG:  Sinus rhythm Lines: RIJ trialysis catheter 8/18>>>  A: Normal sinus rhythm, normotensive P:  Continuous cardio-pulmonary monitoring.  RENAL  Lab 10/26/11 0310 10/25/11 0340 10/24/11 0418 10/23/11 1045 10/23/11 0930 10/23/11 0240 10/22/11 0914  NA 148* 139 141 -- 138 -- 134*  K 4.2 3.9 -- -- -- -- --  CL 110 103 106 -- 101 -- 102  CO2 33* 28 20 -- 22 -- 23  BUN 17 12 19  -- 13 -- 31*  CREATININE 0.73 0.80 1.33* -- 1.14* 1.85* --  CALCIUM 8.8 7.8* 7.1* -- 7.5* -- 11.0*  MG 1.7 1.4* 1.4* -- -- -- --  PHOS 1.5* 1.0* 2.6 2.6 -- -- --  Intake/Output      08/21 0701 - 08/22 0700 08/22 0701 - 08/23 0700   I.V. (mL/kg) 381 (3.4)    NG/GT 1300 40   IV Piggyback 690    Total Intake(mL/kg) 2371 (21.1) 40 (0.4)   Urine (mL/kg/hr) 5410 (2) 375   Stool 100 180   Total Output 5510 555   Net -3139 -515          Intake/Output Summary (Last 24 hours) at 10/26/11 0906 Last data filed at 10/26/11 0800  Gross per 24 hour  Intake 2156.67 ml  Output   5805 ml  Net -3648.33 ml   Foley:  In place 10/22/11  A:  ARF 2/2 lithium toxicity requiring dialysis for lithium removal P:   Dialysis catheter to be removed. HD  completed. Repeat lithium level today.  GASTROINTESTINAL  Lab 10/23/11 0240 10/22/11 1000  AST -- 14  ALT 17 23  ALKPHOS -- 196*  BILITOT -- 0.3  PROT -- 6.7  ALBUMIN -- 3.7    A:  History of diarrhea for the last 4 days P:   No recent history of antibiotics to suggest C. Dif Stool for C. Diff negative  HEMATOLOGIC  Lab 10/26/11 0310 10/25/11 0340 10/24/11 0418 10/23/11 0500 10/23/11 0240 10/22/11 0914  HGB 10.9* 10.7* 10.2* 11.0* -- 12.6  HCT 33.6* 33.0* 31.6* 34.0* -- 37.5  PLT 276 250 264 294 -- 333  INR -- -- -- -- 1.03 --  APTT -- -- -- -- 27 --   A:  No current problems P:  Follow daily H/H, platelets. Heparin for DVT prophylaxis.  INFECTIOUS  Lab 10/26/11 0310 10/25/11 0340 10/24/11 0418 10/23/11 0500 10/22/11 0914  WBC 12.1* 15.8* 15.2* 20.1* 12.9*  PROCALCITON -- -- -- -- --   Cultures: Blood 8/20>>> Sputum 8/20>>> Urine 8/20>>>Pansensitive E. Coli.  Antibiotics: Unasyn 8/20>>>8/22 Rocephin 8/22>>>8/26 (stop date in place)  A:  ? Aspiration PNA with obscurity of the left heart border. P:   D/C Unasyn. Start rocephin. F/U on culture.  ENDOCRINE  Lab 10/26/11 0743 10/26/11 0433 10/26/11 0021 10/25/11 2018 10/25/11 1545  GLUCAP 189* 116* 122* 320* 277*   A:  DM previously on metformin   P:   Keep off metformin. Lantus to 10 units sq q12.  NEUROLOGIC  A:  AMS, psych history P:   Lithium toxicity level from 0.7, will repeat today. Once extubated consult with psychiatry for help with psych meds.  These had been titrated by patients psychiatrist recently. Restarted zyprexa and congentin. Intermittent sedation protocol.  Daughter  Updated bedside.  CC time 35 min.  Alyson Reedy, M.D. [facility] Pulmonary/Critical Care Medicine. Pager: [phone]. After hours pager: [phone].

## 2011-10-26 NOTE — Progress Notes (Signed)
eLink Physician-Brief Progress Note Patient Name: ARIZONA SORN DOB: [birth year] MRN: [id number]  Date of Service  10/26/2011   HPI/Events of Note  Piv, no hd  eICU Interventions  Dc line      Nelda Bucks. 10/26/2011, 4:10 PM

## 2011-10-26 NOTE — Care Management Note (Signed)
  Page 2 of 2   11/02/2011     12:10:56 PM   CARE MANAGEMENT NOTE 11/02/2011  Patient:  PHUONG, HILLARY   Account Number:  [id number]  Date Initiated:  10/23/2011  Documentation initiated by:  Avie Arenas  Subjective/Objective Assessment:   Lithium toxicity  Lives with family     Action/Plan:   Anticipated DC Date:  11/02/2011   Anticipated DC Plan:  HOME/SELF CARE      DC Planning Services  CM consult      PAC Choice  DURABLE MEDICAL EQUIPMENT  HOME HEALTH   Choice offered to / List presented to:  C-1 Patient   DME arranged  Levan Hurst      DME agency  [facility]     [facility] arranged  HH-2 PT  HH-1 RN      [facility] agency  [facility]   Status of service:  In process, will continue to follow Medicare Important Message given?   (If response is "NO", the following Medicare IM given date fields will be blank) Date Medicare IM given:   Date Additional Medicare IM given:    Discharge Disposition:    Per UR Regulation:  Reviewed for med. necessity/level of care/duration of stay  If discussed at Long Length of Stay Meetings, dates discussed:   10/31/2011    Comments:  Contact:  Harris,Lois Daughter [phone]                 Harris,Brittany Daughter [phone]  11-02-11 order for [facility] , Carollee Herter with [facility] unable to staff case. Discussed with patient , [facility] chose [facility] .  Face Sheet is correct , however, her cell phone will not be turned back on until 11-03-11. Patient would like [facility] to call one of her daughters to make arrangements :  Harris,Lois Daughter [phone]                 Harris,Brittany Daughter [phone]   Glenisha Gundry RN BSN [phone]    11-01-11 Elnita Maxwell from [facility] returned call.; unable to accept referral due to [facility] will not cover PT for patient.  MD if you think patient would benefit from [facility] please order.   Ronny Flurry RN BSN [phone]  11-01-11 Rolling  walker ordered through [facility]. Called [facility] [phone] , Fax [phone], still checking on staffing .  Ronny Flurry RN BSN [phone]  10/31/11 1400 Henrietta Mayo RN MSN CCM PT recommends home health therapy and rolling walker, provided list of [facility] agencies, referral made per choice.  [facility] will cover only 3 home visits, agency requests clinical information before determination is made about staffing case.  Pt wants to know if she will have a copay for walker before it is ordered - TC to equipment provider to determine - pt requests walker since there will be no copay. 1430 Facesheet, history and physical, and PT notes faxed to [facility] intake.  Will fax orders when entered by attending.  10/30/11 1454 Henrietta Mayo RN MSN CCM X-ferred to [employer], room air sats 90s; pending psych consult.  10-26-11 11:40am Johny Shears - [phone] Lives with daughter [place].  Still on vent - ?? need on extubation as has been off regular psych meds, probably need psych consult.  Will also need PT/OT on extubationd.

## 2011-10-27 ENCOUNTER — Inpatient Hospital Stay (HOSPITAL_COMMUNITY): Payer: Medicaid Other

## 2011-10-27 DIAGNOSIS — J189 Pneumonia, unspecified organism: Secondary | ICD-10-CM

## 2011-10-27 DIAGNOSIS — G039 Meningitis, unspecified: Secondary | ICD-10-CM

## 2011-10-27 LAB — BLOOD GAS, ARTERIAL
Drawn by: 35235
FIO2: 0.4 %
O2 Saturation: 98.1 %
PEEP: 5 cmH2O
Patient temperature: 98.6
RATE: 18 resp/min
pO2, Arterial: 105 mmHg — ABNORMAL HIGH (ref 80.0–100.0)

## 2011-10-27 LAB — STOOL CULTURE

## 2011-10-27 LAB — CBC
HCT: 36.9 % (ref 36.0–46.0)
Hemoglobin: 11.7 g/dL — ABNORMAL LOW (ref 12.0–15.0)
MCH: 30.2 pg (ref 26.0–34.0)
MCHC: 31.7 g/dL (ref 30.0–36.0)
MCV: 95.1 fL (ref 78.0–100.0)

## 2011-10-27 LAB — GLUCOSE, CAPILLARY
Glucose-Capillary: 257 mg/dL — ABNORMAL HIGH (ref 70–99)
Glucose-Capillary: 298 mg/dL — ABNORMAL HIGH (ref 70–99)
Glucose-Capillary: 303 mg/dL — ABNORMAL HIGH (ref 70–99)

## 2011-10-27 LAB — CULTURE, BLOOD (ROUTINE X 2): Culture: NO GROWTH

## 2011-10-27 LAB — BASIC METABOLIC PANEL
BUN: 21 mg/dL (ref 6–23)
Creatinine, Ser: 0.77 mg/dL (ref 0.50–1.10)
GFR calc non Af Amer: >90 mL/min (ref >90)
Glucose, Bld: 316 mg/dL — ABNORMAL HIGH (ref 70–99)
Potassium: 4.3 mEq/L (ref 3.5–5.1)

## 2011-10-27 MED ORDER — FUROSEMIDE 10 MG/ML IJ SOLN
INTRAMUSCULAR | Status: AC
  Filled 2011-10-27: qty 4

## 2011-10-27 MED ORDER — ALBUTEROL SULFATE (5 MG/ML) 0.5% IN NEBU
2.5000 mg | INHALATION_SOLUTION | Freq: Four times a day (QID) | RESPIRATORY_TRACT | Status: DC
  Administered 2011-10-27 – 2011-10-29 (×7): 2.5 mg via RESPIRATORY_TRACT
  Filled 2011-10-27 (×7): qty 0.5

## 2011-10-27 MED ORDER — METHYLPREDNISOLONE SODIUM SUCC 40 MG IJ SOLR
40.0000 mg | INTRAMUSCULAR | Status: AC
  Administered 2011-10-27: 40 mg via INTRAVENOUS
  Filled 2011-10-27: qty 1

## 2011-10-27 MED ORDER — METHYLPREDNISOLONE SODIUM SUCC 40 MG IJ SOLR
20.0000 mg | INTRAMUSCULAR | Status: AC
  Administered 2011-10-27 – 2011-10-28 (×3): 20 mg via INTRAVENOUS
  Filled 2011-10-27 (×3): qty 0.5

## 2011-10-27 MED ORDER — FUROSEMIDE 10 MG/ML IJ SOLN
INTRAMUSCULAR | Status: AC
  Administered 2011-10-27: 40 mg via INTRAVENOUS
  Filled 2011-10-27: qty 4

## 2011-10-27 MED ORDER — ALBUTEROL SULFATE (5 MG/ML) 0.5% IN NEBU
INHALATION_SOLUTION | RESPIRATORY_TRACT | Status: AC
  Filled 2011-10-27: qty 0.5

## 2011-10-27 MED ORDER — FUROSEMIDE 10 MG/ML IJ SOLN
40.0000 mg | Freq: Once | INTRAMUSCULAR | Status: AC
  Administered 2011-10-27: 40 mg via INTRAVENOUS

## 2011-10-27 MED ORDER — LIVING WELL WITH DIABETES BOOK
Freq: Once | Status: DC
  Filled 2011-10-27: qty 1

## 2011-10-27 MED ORDER — MAGNESIUM SULFATE 40 MG/ML IJ SOLN
2.0000 g | Freq: Once | INTRAMUSCULAR | Status: AC
  Administered 2011-10-27: 2 g via INTRAVENOUS
  Filled 2011-10-27: qty 50

## 2011-10-27 MED ORDER — INSULIN GLARGINE 100 UNIT/ML ~~LOC~~ SOLN
15.0000 [IU] | Freq: Every day | SUBCUTANEOUS | Status: DC
  Administered 2011-10-27: 15 [IU] via SUBCUTANEOUS

## 2011-10-27 NOTE — Progress Notes (Signed)
Pt extubated per order placed on  4L  tolerating well.  Pt able to verbalize weak cough.  SPO2 99% rt to titrate O2 per order.

## 2011-10-27 NOTE — Progress Notes (Addendum)
Inpatient Diabetes Program Recommendations  AACE/ADA: New Consensus Statement on Inpatient Glycemic Control (2013)  Target Ranges:  Prepandial:   less than 140 mg/dL      Peak postprandial:   less than 180 mg/dL (1-2 hours)      Critically ill patients:  140 - 180 mg/dL   Reason for Visit: Hyperglycemia on steroid therapy and referral  Inpatient Diabetes Program Recommendations Insulin - Basal: xxx Insulin - Meal Coverage: Especially while on high dose steroid therapy, please add tube feed coverage of 4 units q 4hrs. HgbA1C: Referral requesting insulin dosing.  Please check A1C to assess control prior to hospitalization. Outpatient Referral: Referral for education.  Once pt stable, alert and oriented, pt can get basic bedside RN education using TV ed'l network videos, mosby exit care notes as appropriate. Please order RD consult if appropriate before discharge for diet education. Note: Thank you, Lenor Coffin, RN, CNS, Diabetes Coordinator [phone])

## 2011-10-27 NOTE — Progress Notes (Signed)
Name: Emily Cordova MRN: [id number] DOB: [birth year]    LOS: 5  Referring Provider:  OSH Reason for Referral:  Lithium toxicity need for dialysis  PULMONARY / CRITICAL CARE MEDICINE  HPI:  Emily Cordova is a 41 y/o woman with an extensive past psychiatric history who presented to an outside hospital with altered behavior.  She was found to have a lithium level of 4 and was transferred to [facility] to initiate dialysis.  According to her daughter her psychiatrist has been titrating her psychiatric medications recently.  Upon arrival in the ICU she will briefly open her eyes to physical stimulus but will not maintain alertness.   Brief patient description:  41 y/o obese pt with lithium overdose requiring dialyis  Events Since Admission: Intubation 8/18 for AMS Dialysis catheter placement RIJ 8/18  Current Status: Critical  Vital Signs: Temp:  [98.6 F (37 C)-101.1 F (38.4 C)] 99.4 F (37.4 C) (08/23 0756) Pulse Rate:  [69-107] 74  (08/23 0900) Resp:  [16-31] 25  (08/23 0900) BP: (109-168)/(55-120) 139/71 mmHg (08/23 0900) SpO2:  [98 %-100 %] 100 % (08/23 0900) FiO2 (%):  [39.7 %-94.1 %] 40.9 % (08/23 0900) Weight:  [108 kg (238 lb 1.6 oz)] 108 kg (238 lb 1.6 oz) (08/23 0500)  Physical Examination: General:  Obese woman, more arousable and following command. Neuro:  PERRL, opens eyes to physical stimuli HEENT: [place]/AT, PERRL, EOM-I and MMM. Neck:  supple Cardiovascular:  NRRR, II/VII systolic murmur, loudest at LUSB Lungs:  CTAB, no wrr Abdomen:  Obese, NTND, +BS, no appreciable HSM Musculoskeletal:  No joint abnormalities, no c/c/e Skin:  No rash  Principal Problem:  *Lithium toxicity Active Problems:  Bipolar 1 disorder  Altered mental status  Acute respiratory failure  Hypoxemia  ASSESSMENT AND PLAN  PULMONARY  Lab 10/27/11 0556 10/26/11 0336 10/25/11 0356 10/24/11 0319 10/23/11 0331  PHART 7.435 7.451* 7.462* 7.361 7.322*  PCO2ART 45.6* 45.0 36.6 30.9* 46.3*    PO2ART 105.0* 98.0 135.0* 101.0* 120.0*  HCO3 30.1* 31.3* 26.0* 17.4* 23.9  O2SAT 98.1 98.0 99.0 98.0 98.0   Ventilator Settings: Vent Mode:  [-] PCV FiO2 (%):  [39.7 %-94.1 %] 40.9 % Set Rate:  [18 bmp] 18 bmp PEEP:  [4.7 cmH20-5.1 cmH20] 4.8 cmH20 Pressure Support:  [5 cmH20-24 cmH20] 5 cmH20 Plateau Pressure:  [18 cmH20-21 cmH20] 21 cmH20 CXR:  ETT in good position, dialysis catheter in RIJ in good position, no obvious infiltrates ETT:  7.5 at 24 at the lip 8/18>>>  A:  Intubated for airway protection secondary to altered mental status, ?aspiration PNA with fever. P:   SBT to extubate today. Continue rocephin per pharmacy. Titrate O2. IS and flutter valve.  CARDIOVASCULAR  Lab 10/22/11 0947  TROPONINI <0.30  LATICACIDVEN --  PROBNP --   ECG:  Sinus rhythm Lines: RIJ trialysis catheter 8/18>>>  A: Normal sinus rhythm, normotensive P:  Continuous cardio-pulmonary monitoring.  RENAL  Lab 10/27/11 0355 10/26/11 0310 10/25/11 0340 10/24/11 0418 10/23/11 1045 10/23/11 0930  NA 147* 148* 139 141 -- 138  K 4.3 4.2 -- -- -- --  CL 106 110 103 106 -- 101  CO2 32 33* 28 20 -- 22  BUN 21 17 12 19  -- 13  CREATININE 0.77 0.73 0.80 1.33* -- 1.14*  CALCIUM 10.3 8.8 7.8* 7.1* -- 7.5*  MG 1.7 1.7 1.4* 1.4* -- --  PHOS 2.9 1.5* 1.0* 2.6 2.6 --   Intake/Output      08/22 0701 - 08/23 0700  08/23 0701 - 08/24 0700   I.V. (mL/kg) 380 (3.5) 40 (0.4)   NG/GT 1485 40   IV Piggyback 308    Total Intake(mL/kg) 2173 (20.1) 80 (0.7)   Urine (mL/kg/hr) 4905 (1.9) 250   Stool 380    Total Output 5285 250   Net -3112 -170          Intake/Output Summary (Last 24 hours) at 10/27/11 0958 Last data filed at 10/27/11 0900  Gross per 24 hour  Intake   2044 ml  Output   4980 ml  Net  -2936 ml   Foley:  In place 10/22/11  A:  ARF 2/2 lithium toxicity requiring dialysis for lithium removal P:   Dialysis catheter to be removed once critical illness is over (used as only IV access for  now). HD completed. Repeat lithium level 0.53. Replace Mg. Recheck in AM.  GASTROINTESTINAL  Lab 10/23/11 0240 10/22/11 1000  AST -- 14  ALT 17 23  ALKPHOS -- 196*  BILITOT -- 0.3  PROT -- 6.7  ALBUMIN -- 3.7   A:  History of diarrhea for the last 4 days P:   No recent history of antibiotics to suggest C. Dif Stool for C. Diff negative  HEMATOLOGIC  Lab 10/27/11 0355 10/26/11 0310 10/25/11 0340 10/24/11 0418 10/23/11 0500 10/23/11 0240  HGB 11.7* 10.9* 10.7* 10.2* 11.0* --  HCT 36.9 33.6* 33.0* 31.6* 34.0* --  PLT 358 276 250 264 294 --  INR -- -- -- -- -- 1.03  APTT -- -- -- -- -- 27   A:  No current problems P:  Follow daily H/H, platelets. Heparin for DVT prophylaxis.  INFECTIOUS  Lab 10/27/11 0355 10/26/11 0310 10/25/11 0340 10/24/11 0418 10/23/11 0500  WBC 10.3 12.1* 15.8* 15.2* 20.1*  PROCALCITON -- -- -- -- --   Cultures: Blood 8/20>>>NTD Sputum 8/20>>>NEISSERIA MENINGITIDIS Urine 8/20>>>Pansensitive E. Coli.  Antibiotics: Unasyn 8/20>>>8/22 Rocephin 8/22>>>8/26 (stop date in place)  A:  ? Aspiration PNA with obscurity of the left heart border. P:   D/C Unasyn. Cont rocephin. ID consult called.  ENDOCRINE  Lab 10/27/11 0717 10/26/11 2353 10/26/11 2200 10/26/11 2016 10/26/11 1533  GLUCAP 303* 329* 314* 326* 302*   A:  DM previously on metformin   P:   Keep off metformin. Lantus to 15 units sq daily. Will not increase further since patient will be NPO post extubation.  NEUROLOGIC  A:  AMS, psych history P:   Lithium toxicity level from 0.7, will repeat today. Once extubated consult with psychiatry for help with psych meds.  These had been titrated by patients psychiatrist recently. Restarted zyprexa and congentin. Intermittent sedation protocol d/ced.  CC time 35 min.  Alyson Reedy, M.D. [facility] Pulmonary/Critical Care Medicine. Pager: [phone]. After hours pager: [phone].

## 2011-10-27 NOTE — Progress Notes (Signed)
Assess patient around 1320. Noticed patient with increased work of breathing. Audible wheezes without auscultation. MD called (Dr. Molli Knock). Orders to give 40mg  of lasix IV x1. RT at bedside.

## 2011-10-27 NOTE — Significant Event (Signed)
Called to evaluate at bedside due to post-extubation stridor. On arrival pt awake, exhibiting loud stridor w/ mild respiratory distress. She did report SOB w/ minimal improvement from SABA at bedside.  BP 149/69  Pulse 79  Temp 99.5 F (37.5 C) (Oral)  Resp 19  Ht 5\' 2"  (1.575 m)  Wt 108 kg (238 lb 1.6 oz)  BMI 43.55 kg/m2  SpO2 99% Exam Awake, + accessory muscle use HENT: neck large. + stridor Pulm + accessory muscle use. Exp wheeze, mostly upper airway Card rrr Abd: obese + bowel sounds Neuro/psych: awake, alert, follows commands. No focal motor def   Lab 10/27/11 0355 10/26/11 0310 10/25/11 0340  NA 147* 148* 139  K 4.3 4.2 3.9  CL 106 110 103  CO2 32 33* 28  BUN 21 17 12   CREATININE 0.77 0.73 0.80  GLUCOSE 316* 116* 156*    Lab 10/27/11 0355 10/26/11 0310 10/25/11 0340  HGB 11.7* 10.9* 10.7*  HCT 36.9 33.6* 33.0*  WBC 10.3 12.1* 15.8*  PLT 358 276 250    Impression Acute respiratory failure in setting of post-extubation stridor.  Aspiration PNA Altered mental status/ acute encephalopathy plan Try NIPPV IV solumedrol  May need to consider re-intubation

## 2011-10-27 NOTE — Consult Note (Signed)
[facility]    Date of Admission:  10/22/2011  Date of Consult:  10/27/2011  Reason for Consult:Neisseria meningitis isolated on sputum culture Referring Physician: Dr. Molli Knock   HPI: Emily Cordova is an 41 y.o. female with schizophrenia bipolar disorder who was admitted after lithium overdose on August 18. She was intubated and required hemodialysis. On admission she did have a temperature of 100.3 and wbc of 21k but  infection was not suspected and patient thought more likely to be having demargination due to overdose. Her chest x-ray did not show any clear-cut infiltrate suggestive of any acquired pneumonia. Admission urine cultures and blood cultures were negative. On the 19th she had a Tmax of 101.1 and she continued to have several other low grade temperatures. Her serial CXR showed largely pulmonary edema but there was a concern for possible aspiration pneumonia and the patient was started on Unasyn on the 20th. Repeat blood cultures urine cultures and sputum culture were also obtained in the 20th. The urine culture grew a pansensitive Escherichia coli the patient was changed to Rocephin. Sputum culture grew Neisseria meningitidis. We are consulted due to concerns that the patient's Neisseria meningitidis might represent a true pathogen is possible for pneumonia or possibly undiagnosed meningitis.  Past Medical History  Diagnosis Date  . Schizophrenia   . Bipolar disorder   . Diabetes mellitus   . Hypercholesteremia   . Hypertension   . Kidney stone     Past Surgical History  Procedure Date  . Cholecystectomy   . Tubal ligation   ergies:   Allergies  Allergen Reactions  . Etomidate Other (See Comments)    Severe Myoclonus: SE  . Haldol (Haloperidol Decanoate)      Medications: I have reviewed patients current medications as documented in Epic Anti-infectives     Start     Dose/Rate Route Frequency Ordered Stop   10/26/11 1030    cefTRIAXone (ROCEPHIN) injection 1 g  Status:  Discontinued        1 g Intramuscular Every 24 hours 10/26/11 0933 10/26/11 0940   10/26/11 1030   cefTRIAXone (ROCEPHIN) 1 g in dextrose 5 % 50 mL IVPB        1 g 100 mL/hr over 30 Minutes Intravenous Every 24 hours 10/26/11 0941     10/24/11 1200   Ampicillin-Sulbactam (UNASYN) 3 g in sodium chloride 0.9 % 100 mL IVPB  Status:  Discontinued        3 g 100 mL/hr over 60 Minutes Intravenous Every 6 hours 10/24/11 1044 10/26/11 0933          Social History:  reports that she has been smoking.  She does not have any smokeless tobacco history on file. She reports that she does not drink alcohol or use illicit drugs.  History reviewed. No pertinent family history.  As in HPI and primary teams notes otherwise 12 point review of systems is negative  Blood pressure 139/71, pulse 74, temperature 99.4 F (37.4 C), temperature source Oral, resp. rate 25, height 5\' 2"  (1.575 m), weight 238 lb 1.6 oz (108 kg), SpO2 100.00%. General: Alert and awak e, oriente to person but not place, morbidly obese HEENT: anicteric sclera, pupils reactive to light and accommodation, EOMI, stridorous sounds in the neck CVS tachycardic normal r,  no murmur rubs or gallops Chest: ansmitted upper airway sounds but otherwise fairly clear  Abdomen: obese soft nontender, nondistended, normal bowel sounds, liquid stool in the rectal bag  Extremities:2+ edema Skin: no rashes Neuro: nonfocal, strength and sensation intact   Results for orders placed during the hospital encounter of 10/22/11 (from the past 48 hour(s))  GLUCOSE, CAPILLARY     Status: Abnormal   Collection Time   10/25/11  3:45 PM      Component Value Range Comment   Glucose-Capillary 277 (*) 70 - 99 mg/dL   GLUCOSE, CAPILLARY     Status: Abnormal   Collection Time   10/25/11  8:18 PM      Component Value Range Comment   Glucose-Capillary 320 (*) 70 - 99 mg/dL   GLUCOSE, CAPILLARY     Status: Abnormal    Collection Time   10/26/11 12:21 AM      Component Value Range Comment   Glucose-Capillary 122 (*) 70 - 99 mg/dL   CBC     Status: Abnormal   Collection Time   10/26/11  3:10 AM      Component Value Range Comment   WBC 12.1 (*) 4.0 - 10.5 K/uL    RBC 3.58 (*) 3.87 - 5.11 MIL/uL    Hemoglobin 10.9 (*) 12.0 - 15.0 g/dL    HCT 96.2 (*) 95.2 - 46.0 %    MCV 93.9  78.0 - 100.0 fL    MCH 30.4  26.0 - 34.0 pg    MCHC 32.4  30.0 - 36.0 g/dL    RDW 84.1  32.4 - 40.1 %    Platelets 276  150 - 400 K/uL   BASIC METABOLIC PANEL     Status: Abnormal   Collection Time   10/26/11  3:10 AM      Component Value Range Comment   Sodium 148 (*) 135 - 145 mEq/L    Potassium 4.2  3.5 - 5.1 mEq/L    Chloride 110  96 - 112 mEq/L    CO2 33 (*) 19 - 32 mEq/L    Glucose, Bld 116 (*) 70 - 99 mg/dL    BUN 17  6 - 23 mg/dL    Creatinine, Ser 0.27  0.50 - 1.10 mg/dL    Calcium 8.8  8.4 - 25.3 mg/dL    GFR calc non Af Amer >90  >90 mL/min    GFR calc Af Amer >90  >90 mL/min   MAGNESIUM     Status: Normal   Collection Time   10/26/11  3:10 AM      Component Value Range Comment   Magnesium 1.7  1.5 - 2.5 mg/dL   PHOSPHORUS     Status: Abnormal   Collection Time   10/26/11  3:10 AM      Component Value Range Comment   Phosphorus 1.5 (*) 2.3 - 4.6 mg/dL   POCT I-STAT 3, BLOOD GAS (G3+)     Status: Abnormal   Collection Time   10/26/11  3:36 AM      Component Value Range Comment   pH, Arterial 7.451 (*) 7.350 - 7.450    pCO2 arterial 45.0  35.0 - 45.0 mmHg    pO2, Arterial 98.0  80.0 - 100.0 mmHg    Bicarbonate 31.3 (*) 20.0 - 24.0 mEq/L    TCO2 33  0 - 100 mmol/L    O2 Saturation 98.0      Acid-Base Excess 7.0 (*) 0.0 - 2.0 mmol/L    Sample type ARTERIAL     GLUCOSE, CAPILLARY     Status: Abnormal   Collection Time   10/26/11  4:33 AM  Component Value Range Comment   Glucose-Capillary 116 (*) 70 - 99 mg/dL   GLUCOSE, CAPILLARY     Status: Abnormal   Collection Time   10/26/11  7:43 AM       Component Value Range Comment   Glucose-Capillary 189 (*) 70 - 99 mg/dL   LITHIUM LEVEL     Status: Abnormal   Collection Time   10/26/11 11:00 AM      Component Value Range Comment   Lithium Lvl 0.53 (*) 0.80 - 1.40 mEq/L   GLUCOSE, CAPILLARY     Status: Abnormal   Collection Time   10/26/11 11:35 AM      Component Value Range Comment   Glucose-Capillary 279 (*) 70 - 99 mg/dL   GLUCOSE, CAPILLARY     Status: Abnormal   Collection Time   10/26/11  3:33 PM      Component Value Range Comment   Glucose-Capillary 302 (*) 70 - 99 mg/dL   GLUCOSE, CAPILLARY     Status: Abnormal   Collection Time   10/26/11  8:16 PM      Component Value Range Comment   Glucose-Capillary 326 (*) 70 - 99 mg/dL   GLUCOSE, CAPILLARY     Status: Abnormal   Collection Time   10/26/11 10:00 PM      Component Value Range Comment   Glucose-Capillary 314 (*) 70 - 99 mg/dL   GLUCOSE, CAPILLARY     Status: Abnormal   Collection Time   10/26/11 11:53 PM      Component Value Range Comment   Glucose-Capillary 329 (*) 70 - 99 mg/dL    Comment 1 Documented in Chart      Comment 2 Notify RN     CBC     Status: Abnormal   Collection Time   10/27/11  3:55 AM      Component Value Range Comment   WBC 10.3  4.0 - 10.5 K/uL    RBC 3.88  3.87 - 5.11 MIL/uL    Hemoglobin 11.7 (*) 12.0 - 15.0 g/dL    HCT 16.1  09.6 - 04.5 %    MCV 95.1  78.0 - 100.0 fL    MCH 30.2  26.0 - 34.0 pg    MCHC 31.7  30.0 - 36.0 g/dL    RDW 40.9  81.1 - 91.4 %    Platelets 358  150 - 400 K/uL   BASIC METABOLIC PANEL     Status: Abnormal   Collection Time   10/27/11  3:55 AM      Component Value Range Comment   Sodium 147 (*) 135 - 145 mEq/L    Potassium 4.3  3.5 - 5.1 mEq/L    Chloride 106  96 - 112 mEq/L    CO2 32  19 - 32 mEq/L    Glucose, Bld 316 (*) 70 - 99 mg/dL    BUN 21  6 - 23 mg/dL    Creatinine, Ser 7.82  0.50 - 1.10 mg/dL    Calcium 95.6  8.4 - 10.5 mg/dL    GFR calc non Af Amer >90  >90 mL/min    GFR calc Af Amer >90  >90  mL/min   MAGNESIUM     Status: Normal   Collection Time   10/27/11  3:55 AM      Component Value Range Comment   Magnesium 1.7  1.5 - 2.5 mg/dL   PHOSPHORUS     Status: Normal   Collection Time  10/27/11  3:55 AM      Component Value Range Comment   Phosphorus 2.9  2.3 - 4.6 mg/dL   BLOOD GAS, ARTERIAL     Status: Abnormal   Collection Time   10/27/11  5:56 AM      Component Value Range Comment   FIO2 0.40      Delivery systems VENTILATOR      Mode PRESSURE CONTROL      Rate 18      Peep/cpap 5.0      pH, Arterial 7.435  7.350 - 7.450    pCO2 arterial 45.6 (*) 35.0 - 45.0 mmHg    pO2, Arterial 105.0 (*) 80.0 - 100.0 mmHg    Bicarbonate 30.1 (*) 20.0 - 24.0 mEq/L    TCO2 31.4  0 - 100 mmol/L    Acid-Base Excess 5.8 (*) 0.0 - 2.0 mmol/L    O2 Saturation 98.1      Patient temperature 98.6      Collection site RIGHT RADIAL      Drawn by [phone]      Sample type ARTERIAL DRAW      Allens test (pass/fail) PASS  PASS   GLUCOSE, CAPILLARY     Status: Abnormal   Collection Time   10/27/11  7:17 AM      Component Value Range Comment   Glucose-Capillary 303 (*) 70 - 99 mg/dL       Component Value Date/Time   SDES URINE, CATHETERIZED 10/24/2011 1327   SPECREQUEST NONE 10/24/2011 1327   CULT ESCHERICHIA COLI 10/24/2011 1327   REPTSTATUS 10/26/2011 FINAL 10/24/2011 1327   Dg Chest Port 1 View  10/27/2011  *RADIOLOGY REPORT*  Clinical Data: Endotracheal tube placement.  PORTABLE CHEST - 1 VIEW  Comparison: 1 day prior  Findings: Endotracheal tube unchanged, with tip 4.0 cm above carina.  Nasogastric extends beyond the  inferior aspect of the film.  Numerous leads and wires project over the chest.  Mildly degraded exam due to AP portable technique and patient body habitus.  Cardiomegaly accentuated by AP portable technique.  No right and no definite left pleural effusion.  Lung volumes are low.  Overall improved aeration, with mild interstitial edema remaining. Improved right base aeration.   Persistent left base air space disease.  IMPRESSION:  1.  Overall improved aeration, with decreased interstitial edema. 2.  Left base airspace disease, likely atelectasis.   Original Report Authenticated By: Consuello Bossier, M.D.    Dg Chest Port 1 View  10/26/2011  *RADIOLOGY REPORT*  Clinical Data: ET tube placement.  PORTABLE CHEST - 1 VIEW  Comparison: 10/25/2011.  Findings: Low lung volumes.  Cardiomegaly persists.  Moderate pulmonary edema unchanged.  Focal opacity at the right base could represent superimposed pneumonia.  Endotracheal tube, hemodialysis catheter, and nasogastric tube appear unchanged given the degree of rotation on today's exam.  IMPRESSION: Cardiomegaly with moderate pulmonary edema persist.  Cannot exclude superimposed right lower lobe pneumonia.  Tubes and lines unchanged.   Original Report Authenticated By: Elsie Stain, M.D.      Recent Results (from the past 720 hour(s))  CULTURE, BLOOD (ROUTINE X 2)     Status: Normal   Collection Time   10/22/11  9:44 AM      Component Value Range Status Comment   Specimen Description BLOOD LEFT HAND   Final    Special Requests     Final    Value: BOTTLES DRAWN AEROBIC AND ANAEROBIC 6CC EACH BOTTLE  Culture NO GROWTH 5 DAYS   Final    Report Status 10/27/2011 FINAL   Final   CULTURE, BLOOD (ROUTINE X 2)     Status: Normal   Collection Time   10/22/11  9:47 AM      Component Value Range Status Comment   Specimen Description BLOOD LEFT ARM   Final    Special Requests BOTTLES DRAWN AEROBIC ONLY 8CC BOTTLE   Final    Culture NO GROWTH 5 DAYS   Final    Report Status 10/27/2011 FINAL   Final   URINE CULTURE     Status: Normal   Collection Time   10/22/11 10:12 AM      Component Value Range Status Comment   Specimen Description URINE, CATHETERIZED   Final    Special Requests NONE   Final    Culture  Setup Time 10/22/2011 21:27   Final    Colony Count NO GROWTH   Final    Culture NO GROWTH   Final    Report Status 10/24/2011  FINAL   Final   CLOSTRIDIUM DIFFICILE BY PCR     Status: Normal   Collection Time   10/22/11  6:14 PM      Component Value Range Status Comment   C difficile by pcr NEGATIVE  NEGATIVE Final   STOOL CULTURE     Status: Normal   Collection Time   10/22/11  6:14 PM      Component Value Range Status Comment   Specimen Description STOOL   Final    Special Requests NONE   Final    Culture     Final    Value: NO SALMONELLA, SHIGELLA, CAMPYLOBACTER, YERSINIA, OR E.COLI 0157:H7 ISOLATED   Report Status 10/27/2011 FINAL   Final   MRSA PCR SCREENING     Status: Normal   Collection Time   10/22/11 11:38 PM      Component Value Range Status Comment   MRSA by PCR NEGATIVE  NEGATIVE Final   CLOSTRIDIUM DIFFICILE BY PCR     Status: Normal   Collection Time   10/23/11 10:04 AM      Component Value Range Status Comment   C difficile by pcr NEGATIVE  NEGATIVE Final   CULTURE, BLOOD (ROUTINE X 2)     Status: Normal (Preliminary result)   Collection Time   10/24/11 11:20 AM      Component Value Range Status Comment   Specimen Description BLOOD HAND LEFT   Final    Special Requests BOTTLES DRAWN AEROBIC ONLY 5CC   Final    Culture  Setup Time 10/24/2011 20:09   Final    Culture     Final    Value:        BLOOD CULTURE RECEIVED NO GROWTH TO DATE CULTURE WILL BE HELD FOR 5 DAYS BEFORE ISSUING A FINAL NEGATIVE REPORT   Report Status PENDING   Incomplete   CULTURE, BLOOD (ROUTINE X 2)     Status: Normal (Preliminary result)   Collection Time   10/24/11 11:35 AM      Component Value Range Status Comment   Specimen Description BLOOD HAND RIGHT   Final    Special Requests BOTTLES DRAWN AEROBIC ONLY 8CC   Final    Culture  Setup Time 10/24/2011 20:09   Final    Culture     Final    Value:        BLOOD CULTURE RECEIVED NO GROWTH TO DATE CULTURE WILL  BE HELD FOR 5 DAYS BEFORE ISSUING A FINAL NEGATIVE REPORT   Report Status PENDING   Incomplete   CULTURE, RESPIRATORY     Status: Normal (Preliminary result)    Collection Time   10/24/11 12:10 PM      Component Value Range Status Comment   Specimen Description OTHER   Final    Special Requests ETT SUCTION   Final    Gram Stain     Final    Value: ABUNDANT WBC PRESENT, PREDOMINANTLY PMN     NO SQUAMOUS EPITHELIAL CELLS SEEN     RARE GRAM POSITIVE COCCI IN PAIRS   Culture     Final    Value: NEISSERIA MENINGITIDIS     Note: BETA LACTAMASE POSITIVE   Report Status PENDING   Incomplete   URINE CULTURE     Status: Normal   Collection Time   10/24/11  1:27 PM      Component Value Range Status Comment   Specimen Description URINE, CATHETERIZED   Final    Special Requests NONE   Final    Culture  Setup Time 10/24/2011 22:06   Final    Colony Count >=100,000 COLONIES/ML   Final    Culture ESCHERICHIA COLI   Final    Report Status 10/26/2011 FINAL   Final    Organism ID, Bacteria ESCHERICHIA COLI   Final      Impression/Recommendation  41 year old patient with schizophrenia bipolar disorder with lithium overdose intubated for airway protection status post hemodialysis with low-grade fevers Escherichia coli isolated from urine post catheterization and with Neisseria meningitidis isolated from sputum --but without clear-cut lobar pneumonia   1) Neisseria meningitidis in sputum: After careful consideration of this case, I believe the Neisseria meningitidis was more likely a colonizer of her upper airway that was isolated on chocolate agar. On admission she had fairly clear lungs without evidence of any acquired pneumonia making Neisseria meningitidis pneumonia unlikely. She has an alternative explanation for altered mental status with her lithium overdose so I think Neisseria meningitidis meningitis is highly unlikely. Therefore the patient does not require further therapy for Neisseria meningitidis. Because we believe this was a colonizer of her upper airway and not a pathogen there is no need for prophylaxis of respiratory therapy a sore ICU physicians  who performed at intubation or manipulation of her airway. Is also no need for respiratory isolation.  I have also reviewed this case with my senior partner Dr. Darlina Sicilian  2) UTI with pan sensitive E coli/: Difficult to discern if she really had a urinary tract infection given that there is no urinalysis. Patient was intubated cannot be asked for about symptoms. She has received 3 days of effective antibiotics I will therefore stop this point time.  3) fevers: These are largely been low-grade and could be due to a number of things including atelectasis. I don't feel she requires further antibiotics at this point in time.  4) diarrhea: She is C. difficile PCR negative and stool studies are negative could have been due to the lithium overdose  5) screening: I'll check her for HIV and acute hepatitis panel.   Thank you so much for this interesting consult  I will sign off for now please call with further questions.   [facility] [phone] (pager) [phone] (office) 10/27/2011, 11:27 AM  Paulette Blanch [place] 10/27/2011, 11:27 AM

## 2011-10-27 NOTE — Clinical Social Work Psychosocial (Signed)
Clinical Social Work Department BRIEF PSYCHOSOCIAL ASSESSMENT 10/27/2011  Patient:  Emily Cordova, Emily Cordova     Account Number:  [id number]     Admit date:  10/22/2011  Clinical Social Worker:  Read Drivers  Date/Time:  10/26/2011 01:27 PM  Referred by:  RN  Date Referred:  10/26/2011 Referred for  Other - See comment   Other Referral:   RN would like for CSW to speak to the family re: Visitation   Interview type:  Other - See comment Other interview type:   CSW spoke to daughter Emily Cordova and daughter Emily Cordova    PSYCHOSOCIAL DATA Living Status:  FAMILY Admitted from facility:   Level of care:   Primary support name:  Emily Cordova Primary support relationship to patient:  CHILD, ADULT Degree of support available:   good    CURRENT CONCERNS  Other Concerns:   none    SOCIAL WORK ASSESSMENT / PLAN CSW assessed pt and pt family situation with the help of pt daughters Emily Cordova and Emily Cordova.  Daughter,  Cordova agreed for health care professionals to release health information and updates to pt siblings at bedside.  RN was appreciative of this.   Assessment/plan status:  Psychosocial Support/Ongoing Assessment of Needs Other assessment/ plan:   none   Information/referral to community resources:   none at this time    PATIENT'S/FAMILY'S RESPONSE TO PLAN OF CARE: Pt family was appreciative of CSW involvement in the continuium of care for pt.        Vickii Penna, LCSWA [phone]  Clinical Social Work

## 2011-10-28 LAB — BASIC METABOLIC PANEL
CO2: 29 mEq/L (ref 19–32)
Calcium: 11.1 mg/dL — ABNORMAL HIGH (ref 8.4–10.5)
Chloride: 108 mEq/L (ref 96–112)
Creatinine, Ser: 0.78 mg/dL (ref 0.50–1.10)
Glucose, Bld: 301 mg/dL — ABNORMAL HIGH (ref 70–99)

## 2011-10-28 LAB — BLOOD GAS, ARTERIAL
Bicarbonate: 27.4 mEq/L — ABNORMAL HIGH (ref 20.0–24.0)
Delivery systems: POSITIVE
TCO2: 28.6 mmol/L (ref 0–100)
pCO2 arterial: 36.4 mmHg (ref 35.0–45.0)
pH, Arterial: 7.489 — ABNORMAL HIGH (ref 7.350–7.450)

## 2011-10-28 LAB — PHOSPHORUS: Phosphorus: 4.4 mg/dL (ref 2.3–4.6)

## 2011-10-28 LAB — GLUCOSE, CAPILLARY: Glucose-Capillary: 262 mg/dL — ABNORMAL HIGH (ref 70–99)

## 2011-10-28 LAB — CBC
Hemoglobin: 11.6 g/dL — ABNORMAL LOW (ref 12.0–15.0)
MCH: 30.4 pg (ref 26.0–34.0)
MCV: 95.3 fL (ref 78.0–100.0)
Platelets: 372 10*3/uL (ref 150–400)
RBC: 3.81 MIL/uL — ABNORMAL LOW (ref 3.87–5.11)
WBC: 10.7 10*3/uL — ABNORMAL HIGH (ref 4.0–10.5)

## 2011-10-28 LAB — MAGNESIUM: Magnesium: 2 mg/dL (ref 1.5–2.5)

## 2011-10-28 MED ORDER — POTASSIUM CHLORIDE 10 MEQ/100ML IV SOLN
INTRAVENOUS | Status: AC
  Administered 2011-10-28: 10 meq
  Filled 2011-10-28: qty 100

## 2011-10-28 MED ORDER — INSULIN GLARGINE 100 UNIT/ML ~~LOC~~ SOLN
20.0000 [IU] | Freq: Every day | SUBCUTANEOUS | Status: DC
  Administered 2011-10-28: 20 [IU] via SUBCUTANEOUS

## 2011-10-28 MED ORDER — FUROSEMIDE 10 MG/ML IJ SOLN
40.0000 mg | Freq: Four times a day (QID) | INTRAMUSCULAR | Status: AC
  Administered 2011-10-28 (×3): 40 mg via INTRAVENOUS
  Filled 2011-10-28 (×3): qty 4

## 2011-10-28 MED ORDER — POTASSIUM CHLORIDE 10 MEQ/50ML IV SOLN: 10.0000 meq | INTRAVENOUS | Status: DC

## 2011-10-28 MED ORDER — POTASSIUM CHLORIDE 10 MEQ/100ML IV SOLN
10.0000 meq | INTRAVENOUS | Status: AC
  Administered 2011-10-28: 10 meq via INTRAVENOUS
  Filled 2011-10-28: qty 100

## 2011-10-28 MED ORDER — MAGNESIUM SULFATE 40 MG/ML IJ SOLN
2.0000 g | Freq: Once | INTRAMUSCULAR | Status: AC
  Administered 2011-10-28: 2 g via INTRAVENOUS
  Filled 2011-10-28: qty 50

## 2011-10-28 MED ORDER — DEXTROSE 5 % IV SOLN
INTRAVENOUS | Status: DC
  Administered 2011-10-28 – 2011-10-29 (×2): via INTRAVENOUS

## 2011-10-28 MED ORDER — PANTOPRAZOLE SODIUM 40 MG IV SOLR
40.0000 mg | INTRAVENOUS | Status: DC
  Administered 2011-10-29: 40 mg via INTRAVENOUS
  Filled 2011-10-28 (×3): qty 40

## 2011-10-28 MED ORDER — OLANZAPINE 10 MG PO TBDP
10.0000 mg | ORAL_TABLET | Freq: Every day | ORAL | Status: DC
  Administered 2011-10-28 – 2011-11-02 (×6): 10 mg via ORAL
  Filled 2011-10-28 (×6): qty 1

## 2011-10-28 MED ORDER — POTASSIUM CHLORIDE 10 MEQ/100ML IV SOLN: 10.0000 meq | INTRAVENOUS | Status: DC

## 2011-10-28 NOTE — Progress Notes (Signed)
Name: Emily Cordova MRN: [id number] DOB: Oct 26, 1970    LOS: 6  Referring Provider:  [facility] Reason for Referral:  Lithium toxicity need for dialysis  PULMONARY / CRITICAL CARE MEDICINE  HPI:  Emily Cordova is a 41 y/o woman with an extensive past psychiatric history who presented to an outside hospital with altered behavior.  She was found to have a lithium level of 4 and was transferred to [facility] to initiate dialysis.  According to her daughter her psychiatrist has been titrating her psychiatric medications recently.  Upon arrival in the ICU she will briefly open her eyes to physical stimulus but will not maintain alertness.   Brief patient description:  41 y/o obese pt with lithium overdose requiring dialyis  Events Since Admission: Intubation 8/18 for AMS Dialysis catheter placement RIJ 8/18  Current Status: Critical  Vital Signs: Temp:  [98.9 F (37.2 C)-99.5 F (37.5 C)] 99.5 F (37.5 C) (08/24 0400) Pulse Rate:  [55-97] 60  (08/24 0600) Resp:  [18-31] 18  (08/24 0600) BP: (115-174)/(51-118) 115/51 mmHg (08/24 0600) SpO2:  [98 %-100 %] 100 % (08/24 0600) FiO2 (%):  [0 %-40.9 %] 39.8 % (08/24 0600) Weight:  [105.8 kg (233 lb 4 oz)] 105.8 kg (233 lb 4 oz) (08/24 0400)  Physical Examination: General:  Obese woman, more arousable and following command. Neuro:  PERRL, opens eyes to physical stimuli HEENT: [place]/AT, PERRL, EOM-I and MMM. Neck:  supple Cardiovascular:  NRRR, II/VII systolic murmur, loudest at LUSB Lungs:  CTAB, no wrr Abdomen:  Obese, NTND, +BS, no appreciable HSM Musculoskeletal:  No joint abnormalities, no c/c/e Skin:  No rash  Principal Problem:  *Lithium toxicity Active Problems:  Bipolar 1 disorder  Altered mental status  Acute respiratory failure  Hypoxemia  ASSESSMENT AND PLAN  PULMONARY  Lab 10/27/11 0556 10/26/11 0336 10/25/11 0356 10/24/11 0319 10/23/11 0331  PHART 7.435 7.451* 7.462* 7.361 7.322*  PCO2ART 45.6* 45.0 36.6 30.9* 46.3*    PO2ART 105.0* 98.0 135.0* 101.0* 120.0*  HCO3 30.1* 31.3* 26.0* 17.4* 23.9  O2SAT 98.1 98.0 99.0 98.0 98.0   Ventilator Settings: Vent Mode:  [-]  FiO2 (%):  [0 %-40.9 %] 39.8 % Pressure Support:  [5 cmH20] 5 cmH20 CXR:  ETT in good position, dialysis catheter in RIJ in good position, no obvious infiltrates ETT:  7.5 at 24 at the lip 8/18>>>  A:  Intubated for airway protection secondary to altered mental status, ?aspiration PNA with fever. P:   BiPAP for post extubation stridor and respiratory failure (increase WOB). Attempt to get off BiPAP today but doubt will be able to today. Titrate O2 for sat of 88-92%. Additional diureses. Solumedrol for stridor.  CARDIOVASCULAR  Lab 10/22/11 0947  TROPONINI <0.30  LATICACIDVEN --  PROBNP --   ECG:  Sinus rhythm Lines: RIJ trialysis catheter 8/18>>>  A: Normal sinus rhythm, normotensive P:  Continuous cardio-pulmonary monitoring.  RENAL  Lab 10/27/11 0355 10/26/11 0310 10/25/11 0340 10/24/11 0418 10/23/11 1045 10/23/11 0930  NA 147* 148* 139 141 -- 138  K 4.3 4.2 -- -- -- --  CL 106 110 103 106 -- 101  CO2 32 33* 28 20 -- 22  BUN 21 17 12 19  -- 13  CREATININE 0.77 0.73 0.80 1.33* -- 1.14*  CALCIUM 10.3 8.8 7.8* 7.1* -- 7.5*  MG 1.7 1.7 1.4* 1.4* -- --  PHOS 2.9 1.5* 1.0* 2.6 2.6 --   Intake/Output      08/23 0701 - 08/24 0700 08/24 0701 -  08/25 0700   I.V. (mL/kg) 480 (4.5)    NG/GT 40    IV Piggyback 100    Total Intake(mL/kg) 620 (5.9)    Urine (mL/kg/hr) 1860 (0.7)    Stool 400    Total Output 2260    Net -1640           Intake/Output Summary (Last 24 hours) at 10/28/11 0733 Last data filed at 10/28/11 0700  Gross per 24 hour  Intake    620 ml  Output   2260 ml  Net  -1640 ml   Foley:  In place 10/22/11  A:  ARF 2/2 lithium toxicity requiring dialysis for lithium removal P:   Dialysis catheter to be removed once critical illness is over (used as only IV access for now). HD completed. Repeat lithium  level 0.33, will not recheck. Replace Mg. Recheck in AM. Lasix and K. D5W and diureses for hypernatremia, unable to take PO due to BiPAP.  GASTROINTESTINAL  Lab 10/23/11 0240 10/22/11 1000  AST -- 14  ALT 17 23  ALKPHOS -- 196*  BILITOT -- 0.3  PROT -- 6.7  ALBUMIN -- 3.7   A:  History of diarrhea for the last 4 days P:   No recent history of antibiotics to suggest C. Dif Stool for C. Diff negative  HEMATOLOGIC  Lab 10/28/11 0500 10/27/11 0355 10/26/11 0310 10/25/11 0340 10/24/11 0418 10/23/11 0240  HGB 11.6* 11.7* 10.9* 10.7* 10.2* --  HCT 36.3 36.9 33.6* 33.0* 31.6* --  PLT 372 358 276 250 264 --  INR -- -- -- -- -- 1.03  APTT -- -- -- -- -- 27   A:  No current problems P:  Follow daily H/H, platelets. Heparin for DVT prophylaxis.  INFECTIOUS  Lab 10/28/11 0500 10/27/11 0355 10/26/11 0310 10/25/11 0340 10/24/11 0418  WBC 10.7* 10.3 12.1* 15.8* 15.2*  PROCALCITON -- -- -- -- --   Cultures: Blood 8/20>>>NTD Sputum 8/20>>>NEISSERIA MENINGITIDIS Urine 8/20>>>Pansensitive E. Coli.  Antibiotics: Unasyn 8/20>>>8/22 Rocephin 8/22>>>8/26 (stop date in place)  A:  ? Aspiration PNA with obscurity of the left heart border. P:   D/C Unasyn. ID consult appreciated.  ENDOCRINE  Lab 10/28/11 0356 10/27/11 2359 10/27/11 1943 10/27/11 1623 10/27/11 1127  GLUCAP 284* 298* 308* 231* 254*   A:  DM previously on metformin   P:   Keep off metformin. Lantus to 20 units sq daily. Will need swallow evaluation prior to start of TF but would like to get off BiPAP first.  NEUROLOGIC  A:  AMS, psych history P:   Lithium toxicity level from 0.7, will repeat today. Once extubated consult with psychiatry for help with psych meds.  These had been titrated by patients psychiatrist recently. Change zyprexa to sub-lingual since unable to take PO and hold Cogentin.  CC time 35 min.  Emily Cordova, M.D. [facility] Pulmonary/Critical Care Medicine. Pager: [phone]. After hours  pager: [phone].

## 2011-10-28 NOTE — Progress Notes (Signed)
PT Cancellation Note  Treatment cancelled today due to pt currently on Bipap and discussed with RN to hold PT until tomorrow.    Sunny Schlein, [place] [phone] 10/28/2011, 8:42 AM

## 2011-10-29 ENCOUNTER — Inpatient Hospital Stay (HOSPITAL_COMMUNITY): Payer: Medicaid Other

## 2011-10-29 LAB — BASIC METABOLIC PANEL
Calcium: 11.1 mg/dL — ABNORMAL HIGH (ref 8.4–10.5)
Chloride: 103 mEq/L (ref 96–112)
Creatinine, Ser: 0.82 mg/dL (ref 0.50–1.10)
GFR calc Af Amer: >90 mL/min (ref >90)
GFR calc non Af Amer: 87 mL/min — ABNORMAL LOW (ref >90)

## 2011-10-29 LAB — GLUCOSE, CAPILLARY: Glucose-Capillary: 225 mg/dL — ABNORMAL HIGH (ref 70–99)

## 2011-10-29 LAB — CBC
MCHC: 32 g/dL (ref 30.0–36.0)
MCV: 95.1 fL (ref 78.0–100.0)
Platelets: 419 10*3/uL — ABNORMAL HIGH (ref 150–400)
RDW: 12.3 % (ref 11.5–15.5)
WBC: 12.6 10*3/uL — ABNORMAL HIGH (ref 4.0–10.5)

## 2011-10-29 LAB — MAGNESIUM: Magnesium: 2 mg/dL (ref 1.5–2.5)

## 2011-10-29 MED ORDER — ALBUTEROL SULFATE (5 MG/ML) 0.5% IN NEBU: 2.5000 mg | INHALATION_SOLUTION | RESPIRATORY_TRACT | Status: DC | PRN

## 2011-10-29 MED ORDER — INSULIN ASPART 100 UNIT/ML ~~LOC~~ SOLN
0.0000 [IU] | Freq: Three times a day (TID) | SUBCUTANEOUS | Status: DC
  Administered 2011-10-30: 11 [IU] via SUBCUTANEOUS
  Administered 2011-10-30: 7 [IU] via SUBCUTANEOUS
  Administered 2011-10-30: 20 [IU] via SUBCUTANEOUS
  Administered 2011-10-30 – 2011-10-31 (×2): 15 [IU] via SUBCUTANEOUS

## 2011-10-29 MED ORDER — INSULIN GLARGINE 100 UNIT/ML ~~LOC~~ SOLN
15.0000 [IU] | Freq: Two times a day (BID) | SUBCUTANEOUS | Status: DC
  Administered 2011-10-29 – 2011-10-30 (×3): 15 [IU] via SUBCUTANEOUS

## 2011-10-29 NOTE — Progress Notes (Signed)
Name: Emily Cordova MRN: [id number] DOB: Dec 04, 1970    LOS: 7  Referring Provider:  OSH Reason for Referral:  Lithium toxicity need for dialysis  PULMONARY / CRITICAL CARE MEDICINE  HPI:  Emily Cordova is a 41 y/o woman with an extensive past psychiatric history who presented to an outside hospital with altered behavior.  She was found to have a lithium level of 4 and was transferred to [facility] to initiate dialysis.  According to her daughter her psychiatrist has been titrating her psychiatric medications recently.  Upon arrival in the ICU she will briefly open her eyes to physical stimulus but will not maintain alertness.   Brief patient description:  41 y/o obese pt with lithium overdose requiring dialyis  Events Since Admission: Intubation 8/18 for AMS Dialysis catheter placement RIJ 8/18  Current Status: Critical  Vital Signs: Temp:  [98.1 F (36.7 C)-98.8 F (37.1 C)] 98.7 F (37.1 C) (08/25 0427) Pulse Rate:  [51-76] 62  (08/25 0600) Resp:  [15-23] 22  (08/25 0600) BP: (119-160)/(57-96) 147/76 mmHg (08/25 0600) SpO2:  [96 %-100 %] 99 % (08/25 0600) FiO2 (%):  [40 %-40.4 %] 40 % (08/24 0819) Weight:  [102.6 kg (226 lb 3.1 oz)] 102.6 kg (226 lb 3.1 oz) (08/25 0500)  Physical Examination: General:  Obese woman, alert and interactive and following command.  Psychotic. Neuro:  Alert and oriented, moves all ext. HEENT: [place]/AT, PERRL, EOM-I and MMM. Neck:  supple Cardiovascular:  NRRR, II/VII systolic murmur, loudest at LUSB Lungs:  CTAB, no wrr Abdomen:  Obese, NTND, +BS, no appreciable HSM Musculoskeletal:  No joint abnormalities, no c/c/e Skin:  No rash  Principal Problem:  *Lithium toxicity Active Problems:  Bipolar 1 disorder  Altered mental status  Acute respiratory failure  Hypoxemia  ASSESSMENT AND PLAN  PULMONARY  Lab 10/28/11 0816 10/27/11 0556 10/26/11 0336 10/25/11 0356 10/24/11 0319  PHART 7.489* 7.435 7.451* 7.462* 7.361  PCO2ART 36.4 45.6* 45.0  36.6 30.9*  PO2ART 178.0* 105.0* 98.0 135.0* 101.0*  HCO3 27.4* 30.1* 31.3* 26.0* 17.4*  O2SAT 99.5 98.1 98.0 99.0 98.0   Ventilator Settings: Vent Mode:  [-]  FiO2 (%):  [40 %-40.4 %] 40 % CXR:  ETT in good position, dialysis catheter in RIJ in good position, no obvious infiltrates ETT:  7.5 at 24 at the lip 8/18>>>8/23  A:  Intubated for airway protection secondary to altered mental status, ?aspiration PNA with fever. P:   D/C BiPAP out of the room. Titrate O2 for sat of 88-92%. Hold further diureses. D/C Solumedrol was used for stridor.  CARDIOVASCULAR  Lab 10/22/11 0947  TROPONINI <0.30  LATICACIDVEN --  PROBNP --   ECG:  Sinus rhythm Lines: RIJ trialysis catheter 8/18>>>8/22  A: Normal sinus rhythm, normotensive P:  Continuous cardio-pulmonary monitoring.  RENAL  Lab 10/29/11 0400 10/28/11 0500 10/27/11 0355 10/26/11 0310 10/25/11 0340  NA 146* 149* 147* 148* 139  K 3.9 4.8 -- -- --  CL 103 108 106 110 103  CO2 32 29 32 33* 28  BUN 28* 28* 21 17 12   CREATININE 0.82 0.78 0.77 0.73 0.80  CALCIUM 11.1* 11.1* 10.3 8.8 7.8*  MG 2.0 2.0 1.7 1.7 1.4*  PHOS 4.7* 4.4 2.9 1.5* 1.0*   Intake/Output      08/24 0701 - 08/25 0700 08/25 0701 - 08/26 0700   I.V. (mL/kg) 850 (8.3)    NG/GT     IV Piggyback 250    Total Intake(mL/kg) 1100 (10.7)  Urine (mL/kg/hr) 2100 (0.9)    Stool 400    Total Output 2500    Net -1400           Intake/Output Summary (Last 24 hours) at 10/29/11 0726 Last data filed at 10/29/11 0700  Gross per 24 hour  Intake   1100 ml  Output   2500 ml  Net  -1400 ml   Foley:  In place 10/22/11  A:  ARF 2/2 lithium toxicity requiring dialysis for lithium removal P:   D/C HD catheter. HD completed, defer for renal. Repeat lithium level 0.33, will not recheck. KVO IVF. Hold further lasix. BMET in AM.  GASTROINTESTINAL  Lab 10/23/11 0240 10/22/11 1000  AST -- 14  ALT 17 23  ALKPHOS -- 196*  BILITOT -- 0.3  PROT -- 6.7  ALBUMIN --  3.7   A:  History of diarrhea for the last 4 days P:   No recent history of antibiotics to suggest C. Dif Stool for C. Diff negative  HEMATOLOGIC  Lab 10/29/11 0400 10/28/11 0500 10/27/11 0355 10/26/11 0310 10/25/11 0340 10/23/11 0240  HGB 12.4 11.6* 11.7* 10.9* 10.7* --  HCT 38.8 36.3 36.9 33.6* 33.0* --  PLT 419* 372 358 276 250 --  INR -- -- -- -- -- 1.03  APTT -- -- -- -- -- 27   A:  No current problems P:  Follow daily H/H, platelets. Heparin for DVT prophylaxis.  INFECTIOUS  Lab 10/29/11 0400 10/28/11 0500 10/27/11 0355 10/26/11 0310 10/25/11 0340  WBC 12.6* 10.7* 10.3 12.1* 15.8*  PROCALCITON -- -- -- -- --   Cultures: Blood 8/20>>>NTD Sputum 8/20>>>NEISSERIA MENINGITIDIS Urine 8/20>>>Pansensitive E. Coli.  Antibiotics: Unasyn 8/20>>>8/22 Rocephin 8/22>>>8/25 (per ID)  A:  ? Aspiration PNA with obscurity of the left heart border. P:   D/C Unasyn. ID consult appreciated.  ENDOCRINE  Lab 10/29/11 0333 10/28/11 2344 10/28/11 1921 10/28/11 1553 10/28/11 1117  GLUCAP 225* 289* 237* 282* 230*   A:  DM previously on metformin   P:   Keep off metformin. Increase lantus 15 units SQ BID. Heart healthy diet.  NEUROLOGIC  A:  AMS, psych history P:   Lithium toxicity level from 0.7, will repeat today. Once extubated consult with psychiatry for help with psych meds.  These had been titrated by patients psychiatrist recently. Change zyprexa to sub-lingual since unable to take PO and hold Cogentin.  Transfer to SDU, TRH to pick up on 8/26 and PCCM will sign off, please call back if needed.  Alyson Reedy, M.D. [facility] Pulmonary/Critical Care Medicine. Pager: [phone]. After hours pager: [phone].

## 2011-10-29 NOTE — Progress Notes (Signed)
Received report from Benson, RN at [phone].  Pt arrived at 1850 via bed, no IV, no O2, and on tele.  Stable.  Salomon Mast, RN

## 2011-10-30 DIAGNOSIS — E1165 Type 2 diabetes mellitus with hyperglycemia: Secondary | ICD-10-CM | POA: Diagnosis present

## 2011-10-30 DIAGNOSIS — I10 Essential (primary) hypertension: Secondary | ICD-10-CM | POA: Diagnosis present

## 2011-10-30 LAB — MAGNESIUM: Magnesium: 1.7 mg/dL (ref 1.5–2.5)

## 2011-10-30 LAB — GLUCOSE, CAPILLARY
Glucose-Capillary: 364 mg/dL — ABNORMAL HIGH (ref 70–99)
Glucose-Capillary: 325 mg/dL — ABNORMAL HIGH (ref 70–99)
Glucose-Capillary: 248 mg/dL — ABNORMAL HIGH (ref 70–99)

## 2011-10-30 LAB — BLOOD GAS, ARTERIAL
Acid-Base Excess: 1.8 mmol/L (ref 0.0–2.0)
Drawn by: 31996
Patient temperature: 98.6
pCO2 arterial: 40.2 mmHg (ref 35.0–45.0)
pH, Arterial: 7.424 (ref 7.350–7.450)

## 2011-10-30 LAB — CULTURE, BLOOD (ROUTINE X 2): Culture: NO GROWTH

## 2011-10-30 LAB — CBC
MCH: 30.9 pg (ref 26.0–34.0)
MCV: 92.3 fL (ref 78.0–100.0)
Platelets: 443 10*3/uL — ABNORMAL HIGH (ref 150–400)
RDW: 11.9 % (ref 11.5–15.5)

## 2011-10-30 LAB — BASIC METABOLIC PANEL
BUN: 26 mg/dL — ABNORMAL HIGH (ref 6–23)
Calcium: 10.4 mg/dL (ref 8.4–10.5)
Creatinine, Ser: 0.8 mg/dL (ref 0.50–1.10)
GFR calc Af Amer: >90 mL/min (ref >90)

## 2011-10-30 LAB — CULTURE, RESPIRATORY W GRAM STAIN

## 2011-10-30 MED ORDER — INSULIN GLARGINE 100 UNIT/ML ~~LOC~~ SOLN: 20.0000 [IU] | Freq: Two times a day (BID) | SUBCUTANEOUS | Status: DC

## 2011-10-30 MED ORDER — SODIUM CHLORIDE 0.9 % IV BOLUS (SEPSIS)
500.0000 mL | Freq: Once | INTRAVENOUS | Status: AC
  Administered 2011-10-30: 500 mL via INTRAVENOUS

## 2011-10-30 MED ORDER — SODIUM CHLORIDE 0.9 % IV BOLUS (SEPSIS)
1000.0000 mL | Freq: Once | INTRAVENOUS | Status: AC
  Administered 2011-10-30: 1000 mL via INTRAVENOUS

## 2011-10-30 MED ORDER — SODIUM CHLORIDE 0.9 % IV SOLN
INTRAVENOUS | Status: DC
  Administered 2011-10-30: 500 mL via INTRAVENOUS
  Administered 2011-10-30 – 2011-11-01 (×3): via INTRAVENOUS

## 2011-10-30 MED ORDER — INSULIN GLARGINE 100 UNIT/ML ~~LOC~~ SOLN
28.0000 [IU] | Freq: Two times a day (BID) | SUBCUTANEOUS | Status: DC
  Administered 2011-10-30: 28 [IU] via SUBCUTANEOUS

## 2011-10-30 MED ORDER — LISINOPRIL 40 MG PO TABS
40.0000 mg | ORAL_TABLET | Freq: Every day | ORAL | Status: DC
  Administered 2011-10-30: 40 mg via ORAL
  Filled 2011-10-30: qty 1

## 2011-10-30 MED ORDER — ALBUTEROL SULFATE (5 MG/ML) 0.5% IN NEBU: 2.5000 mg | INHALATION_SOLUTION | RESPIRATORY_TRACT | Status: DC | PRN

## 2011-10-30 NOTE — Progress Notes (Signed)
Nutrition Follow-up  Intervention:   1. Pt may benefit from carbohydrate controlled diet for better blood sugar control.  2. RD will provide DM diet education as able when pt more appropriate.  3. RD will continue to follow   Assessment:   Pt no longer on HD, lithium levels stable. Pt was extubated on 8/23. Diet advanced on 8/25. No swallow evaluation done that I can see. TF was d/c'd with extubation.   Pt with elevated CBG's. No recent A1c on file to assess glucose control at home. Per chart pt with DM type 2-uncontrolled.   Pt sleeping soundly at time of RD visit. Did not wake to name call x3. Pt likely needs DM diet education, was not able to be completed at this time, and no family available. RD will continue to follow and provide education when more appropriate.   Diet Order:  Heart Healthy PO intake: 60-100% meals   Meds: Scheduled Meds:   . benztropine  1.5 mg Oral BID  . heparin  5,000 Units Subcutaneous Q8H  . insulin aspart  0-20 Units Subcutaneous TID WC & HS  . insulin glargine  20 Units Subcutaneous BID  . living well with diabetes book   Does not apply Once  . OLANZapine zydis  10 mg Oral Daily  . sodium chloride  500 mL Intravenous Once  . DISCONTD: antiseptic oral rinse  15 mL Mouth Rinse QID  . DISCONTD: chlorhexidine  15 mL Mouth Rinse BID  . DISCONTD: insulin aspart  0-20 Units Subcutaneous Q4H  . DISCONTD: insulin glargine  15 Units Subcutaneous BID  . DISCONTD: lisinopril  40 mg Oral Daily  . DISCONTD: pantoprazole (PROTONIX) IV  40 mg Intravenous Q24H   Continuous Infusions:   . sodium chloride 500 mL (10/30/11 1508)  . DISCONTD: dextrose     PRN Meds:.acetaminophen (TYLENOL) oral liquid 160 mg/5 mL, albuterol, alum & mag hydroxide-simeth, ondansetron, DISCONTD: dextrose, DISCONTD: fentaNYL, DISCONTD: lidocaine-prilocaine  Labs:  CMP     Component Value Date/Time   NA 135 10/30/2011 0838   K 4.1 10/30/2011 0838   CL 95* 10/30/2011 0838   CO2 30  10/30/2011 0838   GLUCOSE 404* 10/30/2011 0838   BUN 26* 10/30/2011 0838   CREATININE 0.80 10/30/2011 0838   CALCIUM 10.4 10/30/2011 0838   PROT 6.7 10/22/2011 1000   ALBUMIN 3.7 10/22/2011 1000   AST 14 10/22/2011 1000   ALT 17 10/23/2011 0240   ALKPHOS 196* 10/22/2011 1000   BILITOT 0.3 10/22/2011 1000   GFRNONAA 90* 10/30/2011 0838   GFRAA >90 10/30/2011 0838   No results found for this basename: HGBA1C     Intake/Output Summary (Last 24 hours) at 10/30/11 1528 Last data filed at 10/30/11 1200  Gross per 24 hour  Intake   2630 ml  Output   1000 ml  Net   1630 ml    Weight Status:  238 lbs, trending down from admission weight of 257 lbs.   Re-estimated needs:  1900-2100 kcal, 85-100 gm protein   Nutrition Dx:  Inadequate oral intake r/t inability to eat AEB NPO status, resolved.  Pt likely with limited nutrition knowledge, unable to assess education level and needs at this time. RD will continue to follow and address as able.   Goal:  EN goal no longer applicable  New Goal: Adequate glucose control   Monitor:  PO intake, weight, labs, education needs    Clarene Duke RD, LDN Pager [phone] After Hours pager [phone]

## 2011-10-30 NOTE — Progress Notes (Signed)
Pt again found to be hard to arouse, cool, diaphoretic, BP 61/29 HR 50-60.  Dr. Sharon Seller was paged.  New orders were received to give bolus and stat ABG.  1700: RN staff got pt back into bed, pt much more arousable.  BP up to 93/48.  Will continue to assess.  Salomon Mast, RN

## 2011-10-30 NOTE — Progress Notes (Signed)
[facility] Progress Note [place] TEAM 1 - Stepdown/ICU TEAM   SHAREKA CASALE JYN:[id number] DOB: [birth year] DOA: 10/22/2011 PCP: Avon Gully, MD  Brief narrative: 41 year old female patient with long-standing psychiatric history on multiple medications including lithium. She presented to an outside hospital with altered mentation. Subsequently was found to have a lithium level greater than 4 and was transferred to [facility] to initiate emergent dialysis. She did not have associated renal failure. According to the patient's daughter the psychiatrist has been titrating the patient's psychiatric medications recently. Upon arrival to the ICU the patient was minimally responsive and subsequently required intubation to protect her airway. She was also emergently placed on hemodialysis through a catheter. She has now been extubated and tolerated this well. The dialysis catheter was removed on 10/26/2011. She was subsequently deemed appropriate to transfer to the step down unit. TRH Team 1 assumed care of this patient on 10/30/2011.  Assessment/Plan:  Lithium toxicity without renal failure *Unclear if patient inadvertently misunderstood psychiatrist titrating instructions or took purposeful overdose of medications over a period of time *Psychiatry consultation is pending *Last lithium level 0.33 on 10/27/2011 - lithium has not been resumed as of yet pending psychiatric evaluation  Acute respiratory failure with hypoxia due to sedation/meds *Resolved  Bipolar 1 disorder *As noted above regarding lithium and pending psychiatric consultation *Continue Cogentin and Zyprexa for now  Altered mental status *Resolved  Ecoli UTI vs/ colonizer Has completed a course of abx tx as per ID recs  Neisseria meningitidis in sputum Believed to be a colonizer per ID  Diabetes mellitus type 2, uncontrolled *On metformin prior to admission *Continue twice daily Lantus here as well as sliding  scale insulin *Given her need for lithium and also utilization of ACE inhibitor prior to admission we'll need to cautiously resume metformin as an outpatient   HTN (hypertension) *Blood pressure uncontrolled so will resume ACE inhibitor *We'll hold off on HydroDIURIL for now  ADDENDUM 300 pm: Pt's SBP decreased to around 83systolic after Lisinopril dose and she was lethargic and cool and clammy. Discussed with RN- have dc'd Lisinopril and gave 500 cc NS bolus and started fluids at 50 cc/hr. RN holding tx to telemetry.  DVT prophylaxis: SCDs Code Status: Full Family Communication: Spoke directly with patient. If any urgent needs that may require decision making likely will need to discuss with patient's daughter due to patient's underlying psychiatric illness Disposition Plan: Keep in SDU  Consultants: Psychiatry  Procedures: Intubation 8/18 for AMS -extubated 8/23 Dialysis catheter placement RIJ 8/18-discontinued 8/22  Cultures:  Blood 8/20>>>NTD  Sputum 8/20>>>NEISSERIA MENINGITIDIS Urine 8/20>>>Pansensitive E. Coli.  Antibiotics: Unasyn 8/20>>>8/22  Rocephin 8/22>>>8/25 (per ID   HPI/Subjective: Patient alert and sitting upright in the chair. She endorses she's feeling much better. She admits to having "problems with my mind". Does not endorse active issues regarding auditory or visual hallucinations. Denies chest pain or shortness of breath. Mainly complains of being very hungry and receiving inadequate food on dietary trace.   Objective: Blood pressure 140/89, pulse 65, temperature 97.6 F (36.4 C), temperature source Oral, resp. rate 21, height 6\' 2"  (1.88 m), weight 108 kg (238 lb 1.6 oz), SpO2 96.00%.  Intake/Output Summary (Last 24 hours) at 10/30/11 1116 Last data filed at 10/30/11 0900  Gross per 24 hour  Intake   2330 ml  Output   1025 ml  Net   1305 ml     Exam: General: No acute respiratory distress Lungs: Clear to auscultation bilaterally without  wheezes or crackles Cardiovascular: Regular rate and rhythm without murmur gallop or rub normal S1 and S2 Abdomen: Nontender, nondistended, soft, bowel sounds positive, no rebound, no ascites, no appreciable mass-Flexi-Seal with light brown stool Musculoskeletal: No significant cyanosis, clubbing of extremities Neurological: Patient alert and oriented at least to name place and year. Moves all extremities x4 without any focal neurological deficits appreciated  Data Reviewed: Basic Metabolic Panel:  Lab 10/30/11 1610 10/29/11 0400 10/28/11 0500 10/27/11 0355 10/26/11 0310  NA 135 146* 149* 147* 148*  K 4.1 3.9 4.8 4.3 4.2  CL 95* 103 108 106 110  CO2 30 32 29 32 33*  GLUCOSE 404* 248* 301* 316* 116*  BUN 26* 28* 28* 21 17  CREATININE 0.80 0.82 0.78 0.77 0.73  CALCIUM 10.4 11.1* 11.1* 10.3 8.8  MG 1.7 2.0 2.0 1.7 1.7  PHOS 3.4 4.7* 4.4 2.9 1.5*   CBC:  Lab 10/30/11 0838 10/29/11 0400 10/28/11 0500 10/27/11 0355 10/26/11 0310  WBC 8.8 12.6* 10.7* 10.3 12.1*  NEUTROABS -- -- -- -- --  HGB 13.3 12.4 11.6* 11.7* 10.9*  HCT 39.8 38.8 36.3 36.9 33.6*  MCV 92.3 95.1 95.3 95.1 93.9  PLT 443* 419* 372 358 276   CBG:  Lab 10/30/11 0824 10/29/11 2112 10/29/11 1506 10/29/11 1135 10/29/11 0711  GLUCAP 364* 133* 250* 253* 218*    Recent Results (from the past 240 hour(s))  CULTURE, BLOOD (ROUTINE X 2)     Status: Normal   Collection Time   10/22/11  9:44 AM      Component Value Range Status Comment   Specimen Description BLOOD LEFT HAND   Final    Special Requests     Final    Value: BOTTLES DRAWN AEROBIC AND ANAEROBIC 6CC EACH BOTTLE   Culture NO GROWTH 5 DAYS   Final    Report Status 10/27/2011 FINAL   Final   CULTURE, BLOOD (ROUTINE X 2)     Status: Normal   Collection Time   10/22/11  9:47 AM      Component Value Range Status Comment   Specimen Description BLOOD LEFT ARM   Final    Special Requests BOTTLES DRAWN AEROBIC ONLY 8CC BOTTLE   Final    Culture NO GROWTH 5 DAYS    Final    Report Status 10/27/2011 FINAL   Final   URINE CULTURE     Status: Normal   Collection Time   10/22/11 10:12 AM      Component Value Range Status Comment   Specimen Description URINE, CATHETERIZED   Final    Special Requests NONE   Final    Culture  Setup Time 10/22/2011 21:27   Final    Colony Count NO GROWTH   Final    Culture NO GROWTH   Final    Report Status 10/24/2011 FINAL   Final   CLOSTRIDIUM DIFFICILE BY PCR     Status: Normal   Collection Time   10/22/11  6:14 PM      Component Value Range Status Comment   C difficile by pcr NEGATIVE  NEGATIVE Final   STOOL CULTURE     Status: Normal   Collection Time   10/22/11  6:14 PM      Component Value Range Status Comment   Specimen Description STOOL   Final    Special Requests NONE   Final    Culture     Final    Value: NO SALMONELLA, SHIGELLA, CAMPYLOBACTER, YERSINIA, OR  E.COLI 0157:H7 ISOLATED   Report Status 10/27/2011 FINAL   Final   MRSA PCR SCREENING     Status: Normal   Collection Time   10/22/11 11:38 PM      Component Value Range Status Comment   MRSA by PCR NEGATIVE  NEGATIVE Final   CLOSTRIDIUM DIFFICILE BY PCR     Status: Normal   Collection Time   10/23/11 10:04 AM      Component Value Range Status Comment   C difficile by pcr NEGATIVE  NEGATIVE Final   CULTURE, BLOOD (ROUTINE X 2)     Status: Normal   Collection Time   10/24/11 11:20 AM      Component Value Range Status Comment   Specimen Description BLOOD HAND LEFT   Final    Special Requests BOTTLES DRAWN AEROBIC ONLY 5CC   Final    Culture  Setup Time 10/24/2011 20:09   Final    Culture NO GROWTH 5 DAYS   Final    Report Status 10/30/2011 FINAL   Final   CULTURE, BLOOD (ROUTINE X 2)     Status: Normal   Collection Time   10/24/11 11:35 AM      Component Value Range Status Comment   Specimen Description BLOOD HAND RIGHT   Final    Special Requests BOTTLES DRAWN AEROBIC ONLY 8CC   Final    Culture  Setup Time 10/24/2011 20:09   Final    Culture  NO GROWTH 5 DAYS   Final    Report Status 10/30/2011 FINAL   Final   CULTURE, RESPIRATORY     Status: Normal (Preliminary result)   Collection Time   10/24/11 12:10 PM      Component Value Range Status Comment   Specimen Description OTHER   Final    Special Requests ETT SUCTION   Final    Gram Stain     Final    Value: ABUNDANT WBC PRESENT, PREDOMINANTLY PMN     NO SQUAMOUS EPITHELIAL CELLS SEEN     RARE GRAM POSITIVE COCCI IN PAIRS   Culture     Final    Value: NEISSERIA MENINGITIDIS     Note: BETA LACTAMASE POSITIVE   Report Status PENDING   Incomplete   URINE CULTURE     Status: Normal   Collection Time   10/24/11  1:27 PM      Component Value Range Status Comment   Specimen Description URINE, CATHETERIZED   Final    Special Requests NONE   Final    Culture  Setup Time 10/24/2011 22:06   Final    Colony Count >=100,000 COLONIES/ML   Final    Culture ESCHERICHIA COLI   Final    Report Status 10/26/2011 FINAL   Final    Organism ID, Bacteria ESCHERICHIA COLI   Final      Studies:  Recent x-ray studies have been reviewed in detail by the Attending Physician  Scheduled Meds:  Reviewed in detail by the Attending Physician   Junious Silk, ANP [facility] Office  [phone] Pager [phone]  On-Call/Text Page:      [email or web address]      password [email or web address]  If 7PM-7AM, please contact night-coverage [email or web address] Password [email or web address] 10/30/2011, 11:16 AM   LOS: 8 days   I have personally examined this patient and reviewed the entire database. I have reviewed the above note, made any necessary editorial changes, and agree with its content.  Lonia Blood, MD [facility]

## 2011-10-30 NOTE — Progress Notes (Signed)
Pt had foley catheter d/c'd at 1218.  At 1800, we tried to get pt to void on the bedpan; pt could not start a stream.  Per Dr. Sharon Seller, I was to obtain a bladder scan, and if >300 was scanned, I was to reinsert a foley catheter.  After scanning pt, I received 2 readings of >267mL and 1 reading of .  Foley catheter was reinserted.  Salomon Mast, RN

## 2011-10-30 NOTE — Progress Notes (Signed)
Results for Emily Cordova, Emily Cordova (MRN [id number]) as of 10/30/2011 10:15  Ref. Range 10/29/2011 07:11 10/29/2011 11:35 10/29/2011 15:06 10/29/2011 21:12  Glucose-Capillary Latest Range: 70-99 mg/dL 147 (H) 829 (H) 562 (H) 133 (H)   Results for Emily Cordova, Emily Cordova (MRN [id number]) as of 10/30/2011 10:15  Ref. Range 10/30/2011 08:24  Glucose-Capillary Latest Range: 70-99 mg/dL 696 (H)   Admitted with lithium toxicity.  Transferred to floor last evening from ICU.  Extubated and taking PO diet at present.  Patient takes large doses of 70/30 at home- 60 units with breakfast and 40 units with supper.  Noted patent's morning CBG >300 mg/dl today.  Currently only getting Lantus 15 units bid at present along with Resistant SSI ac/hs.  Based on home insulin doses, patient gets ~70 units intermediate acting insulin through his 70/30 insulin doses (this is an NPH-like insulin).  Patient will likely need more Lantus to cover her basal needs or could switch patient to her 70/30 insulin that she takes at home.  If her appetite is good, could switch her to her full home dose of 70/30.  If her appetite is poor, would leave her on Lantus and titrate the Lantus upward.  Will follow. Ambrose Finland RN, MSN, CDE Diabetes Coordinator Inpatient Diabetes Program [phone]

## 2011-10-30 NOTE — Progress Notes (Addendum)
Rounded on pt, found her to be hard to arouse, cool, diaphoretic, and BP 93/41; CBG 243.  Pt was given lisinopril 40mg  at 1250.  New orders were received ( bolus, then NS at 49mL/hr continuous; lisinopril D/C; pt to stay one more night in SDU).  Will continue to monitor.  Salomon Mast, RN

## 2011-10-30 NOTE — Evaluation (Signed)
Physical Therapy Evaluation Patient Details Name: Emily Cordova MRN: [id number] DOB: [birth year] Today's Date: 10/30/2011 Time: 4098-1191 PT Time Calculation (min): 25 min  PT Assessment / Plan / Recommendation Clinical Impression  Patient s/p lithium toxicity with decr mobility secondary to decr balance and decr endurance.  Will benefit from PT to address mobility issues.  Recommend 24 hour care by daughters as patient states they will be there 24 hours, HHPT and RW.      PT Assessment  Patient needs continued PT services    Follow Up Recommendations  Home health PT    Barriers to Discharge        Equipment Recommendations  Rolling walker with 5" wheels    Recommendations for Other Services     Frequency Min 3X/week    Precautions / Restrictions Precautions Precautions: Fall Restrictions Weight Bearing Restrictions: No   Pertinent Vitals/Pain VSS, No pain      Mobility  Bed Mobility Bed Mobility: Not assessed Transfers Transfers: Sit to Stand;Stand to Sit Sit to Stand: 4: Min guard;With upper extremity assist;With armrests;From chair/3-in-1 Stand to Sit: 4: Min guard;With upper extremity assist;With armrests;To chair/3-in-1 Details for Transfer Assistance: Patient impulsive with sit to stand to sit Ambulation/Gait Ambulation/Gait Assistance: 3: Mod assist Ambulation Distance (Feet): 250 Feet Assistive device: Rolling walker Ambulation/Gait Assistance Details: Patient needed min-mod assist with ambulation with RW secondary to decr safety.  Needed assist to steer RW, tends to veer to right.  Needed assist to maintain upright stance x2 as patient was leaning too far to left.  Uncoordinated steps as well.   Gait Pattern: Step-through pattern;Decreased stride length;Lateral trunk lean to left;Trunk flexed;Ataxic Gait velocity: decreased Stairs: No Wheelchair Mobility Wheelchair Mobility: No         PT Diagnosis: Generalized weakness  PT Problem List: Decreased  activity tolerance;Decreased balance;Decreased mobility;Decreased safety awareness;Decreased knowledge of use of DME PT Treatment Interventions: DME instruction;Gait training;Functional mobility training;Therapeutic activities;Therapeutic exercise;Balance training;Patient/family education   PT Goals Acute Rehab PT Goals PT Goal Formulation: With patient Time For Goal Achievement: 11/13/11 Potential to Achieve Goals: Good Pt will go Supine/Side to Sit: Independently PT Goal: Supine/Side to Sit - Progress: Goal set today Pt will go Sit to Stand: Independently PT Goal: Sit to Stand - Progress: Goal set today Pt will Transfer Bed to Chair/Chair to Bed: with supervision PT Transfer Goal: Bed to Chair/Chair to Bed - Progress: Goal set today Pt will Ambulate: with min assist;with least restrictive assistive device;51 - 150 feet PT Goal: Ambulate - Progress: Goal set today  Visit Information  Last PT Received On: 10/30/11 Assistance Needed: +2 (follow with chair)    Subjective Data  Subjective: "I am an encourager." Patient Stated Goal: To go home   Prior Functioning  Home Living Lives With: Family;Daughter Available Help at Discharge: Family;Available 24 hours/day Type of Home: House Home Access: Stairs to enter [employer] of Steps: few Home Layout: One level Home Adaptive Equipment: None Prior Function Level of Independence: Independent Able to Take Stairs?: Yes (with assist) Driving: No Vocation: On disability Communication Communication: No difficulties    Cognition  Overall Cognitive Status: Impaired Area of Impairment: Attention;Following commands;Safety/judgement;Awareness of errors;Awareness of deficits;Problem solving Arousal/Alertness: Awake/alert Orientation Level: Disoriented to;Time;Situation Behavior During Session: Anxious Current Attention Level: Focused Attention - Other Comments: seconds to minutes; gets distracted easily Following Commands:  Follows one step commands inconsistently;Follows one step commands with increased time Safety/Judgement: Decreased safety judgement for tasks assessed;Decreased awareness of need for assistance;Decreased awareness of  safety precautions Safety/Judgement - Other Comments: poor safety with RW, needed a lot of cues to use RW safely and constantly Awareness of Errors: Assistance required to identify errors made;Assistance required to correct errors made Problem Solving: poor    Extremity/Trunk Assessment Right Upper Extremity Assessment RUE ROM/Strength/Tone: [facility] for tasks assessed Left Upper Extremity Assessment LUE ROM/Strength/Tone: WFL for tasks assessed Right Lower Extremity Assessment RLE ROM/Strength/Tone: WFL for tasks assessed RLE Coordination: Deficits Left Lower Extremity Assessment LLE ROM/Strength/Tone: WFL for tasks assessed LLE Coordination: Deficits   Balance Static Standing Balance Static Standing - Balance Support: Bilateral upper extremity supported;During functional activity Static Standing - Level of Assistance: 4: Min assist Static Standing - Comment/# of Minutes: 2 minutes; steadying assist as patient leans to left.  End of Session PT - End of Session Equipment Utilized During Treatment: Gait belt Activity Tolerance: Patient tolerated treatment well Patient left: in chair;with call bell/phone within reach Nurse Communication: Mobility status       INGOLD,Ashten Sarnowski 10/30/2011, 1:56 PM  [facility] [phone] (pager)

## 2011-10-31 DIAGNOSIS — R4182 Altered mental status, unspecified: Secondary | ICD-10-CM

## 2011-10-31 DIAGNOSIS — Z634 Disappearance and death of family member: Secondary | ICD-10-CM

## 2011-10-31 LAB — BASIC METABOLIC PANEL
CO2: 27 mEq/L (ref 19–32)
Calcium: 10.1 mg/dL (ref 8.4–10.5)
Creatinine, Ser: 0.8 mg/dL (ref 0.50–1.10)
GFR calc non Af Amer: 90 mL/min — ABNORMAL LOW (ref >90)
Glucose, Bld: 325 mg/dL — ABNORMAL HIGH (ref 70–99)
Sodium: 138 mEq/L (ref 135–145)

## 2011-10-31 LAB — GLUCOSE, CAPILLARY
Glucose-Capillary: 304 mg/dL — ABNORMAL HIGH (ref 70–99)
Glucose-Capillary: 352 mg/dL — ABNORMAL HIGH (ref 70–99)
Glucose-Capillary: 299 mg/dL — ABNORMAL HIGH (ref 70–99)
Glucose-Capillary: 206 mg/dL — ABNORMAL HIGH (ref 70–99)

## 2011-10-31 LAB — TSH: TSH: 1.453 u[IU]/mL (ref 0.350–4.500)

## 2011-10-31 LAB — HIV-1 RNA QUANT-NO REFLEX-BLD: HIV-1 RNA Quant, Log: <1.3 {Log} (ref <1.30)

## 2011-10-31 MED ORDER — INSULIN ASPART 100 UNIT/ML ~~LOC~~ SOLN: 0.0000 [IU] | Freq: Every day | SUBCUTANEOUS | Status: DC

## 2011-10-31 MED ORDER — INSULIN ASPART PROT & ASPART (70-30 MIX) 100 UNIT/ML ~~LOC~~ SUSP
60.0000 [IU] | Freq: Every day | SUBCUTANEOUS | Status: DC
  Administered 2011-11-01 – 2011-11-02 (×2): 60 [IU] via SUBCUTANEOUS
  Filled 2011-10-31: qty 3

## 2011-10-31 MED ORDER — INSULIN ASPART 100 UNIT/ML ~~LOC~~ SOLN
15.0000 [IU] | Freq: Once | SUBCUTANEOUS | Status: AC
  Administered 2011-10-31: 15 [IU] via SUBCUTANEOUS

## 2011-10-31 MED ORDER — INSULIN NPH (HUMAN) (ISOPHANE) 100 UNIT/ML ~~LOC~~ SUSP
30.0000 [IU] | Freq: Once | SUBCUTANEOUS | Status: AC
  Administered 2011-10-31: 30 [IU] via SUBCUTANEOUS
  Filled 2011-10-31: qty 10

## 2011-10-31 MED ORDER — INSULIN ASPART PROT & ASPART (70-30 MIX) 100 UNIT/ML ~~LOC~~ SUSP
40.0000 [IU] | Freq: Every day | SUBCUTANEOUS | Status: DC
  Administered 2011-10-31 – 2011-11-01 (×2): 40 [IU] via SUBCUTANEOUS
  Filled 2011-10-31 (×2): qty 3

## 2011-10-31 MED ORDER — INSULIN ASPART 100 UNIT/ML ~~LOC~~ SOLN: 6.0000 [IU] | Freq: Three times a day (TID) | SUBCUTANEOUS | Status: DC

## 2011-10-31 MED ORDER — INSULIN ASPART 100 UNIT/ML ~~LOC~~ SOLN: 0.0000 [IU] | Freq: Three times a day (TID) | SUBCUTANEOUS | Status: DC

## 2011-10-31 NOTE — Progress Notes (Signed)
[facility] Progress Note [place] TEAM 1 - Stepdown/ICU TEAM   Emily Cordova ZOX:[id number] DOB: [birth year] DOA: 10/22/2011 PCP: Avon Gully, MD  Brief narrative: 41 year old female patient with long-standing psychiatric history on multiple medications including lithium. She presented to an outside hospital with altered mentation. Subsequently was found to have a lithium level greater than 4 and was transferred to [facility] to initiate emergent dialysis. She did not have associated renal failure. According to the patient's daughter the psychiatrist has been titrating the patient's psychiatric medications recently. Upon arrival to the ICU the patient was minimally responsive and subsequently required intubation to protect her airway. She was also emergently placed on hemodialysis through a catheter. She has now been extubated and tolerated this well. The dialysis catheter was removed on 10/26/2011. She was subsequently deemed appropriate to transfer to the step down unit. [facility] Team 1 assumed care of this patient on 10/30/2011.  Assessment/Plan:  Lithium toxicity without renal failure *Unclear if patient inadvertently misunderstood titrating instructions or took purposeful overdose of medications over a period of time *Psychiatry consultation is pending *Last lithium level 0.33 on 10/27/2011 - lithium has not been resumed as of yet pending psychiatric evaluation *Patient also takes ACE inhibitor and metformin and may benefit from utilizing alternative agent instead of lithium. In addition the patient's daughter also verbalizes concerns about continuing lithium for same reasons.  Hypotension/dehydration *BUN remains elevated *Patient developed hypotension after administration of antihypertensive medications yesterday and it is suspected this is related primarily to hypotension since responded to fluid administration and holding medications *Also contributing to dehydration as patient's  persistent hyperglycemia *She is in negative balance today - increase IV fluids tto 100 cc per hour   Acute respiratory failure with hypoxia due to sedation/meds *Resolved  Bipolar 1 disorder *As noted above regarding lithium and pending psychiatric consultation *Continue Cogentin and Zyprexa for now  Hair loss *Check TSH  Altered mental status *Resolved  Ecoli UTI vs/ colonizer Has completed a course of abx tx as per ID recs  Neisseria meningitidis in sputum Believed to be a colonizer per ID  Diabetes mellitus type 2, uncontrolled *On metformin prior to admission *We'll resume 70/30 insulin. Patient did receive a short acting insulin 15 units this morning so we'll give 30 units NPH. Since we are still not clear regarding patient's usual home dosing, will need to monitor sugars carefully after resuming 70/30.  *can resume Metformin in AM if sugars are still uncontrolled  HTN (hypertension) *Attempt was made to resume lisinopril yesterday the patient developed hypotension that was symptomatic and therefore lisinopril discontinued *Blood pressure has improved with administration of IV fluids *Continue to hold lisinopril and HydroDIURIL for now   DVT prophylaxis: SCDs Code Status: Full Family Communication: Spoke directly with patient. If any urgent needs that may require decision making likely will need to discuss with patient's daughter due to patient's underlying psychiatric illness Disposition Plan: can transfer out of SDU today  Consultants: Psychiatry  Procedures: Intubation 8/18 for AMS -extubated 8/23 Dialysis catheter placement RIJ 8/18-discontinued 8/22  Cultures:  Blood 8/20>>>NTD  Sputum 8/20>>>NEISSERIA MENINGITIDIS Urine 8/20>>>Pansensitive E. Coli.  Antibiotics: Unasyn 8/20>>>8/22  Rocephin 8/22>>>8/25 (per ID   HPI/Subjective: Patient in good spirits and not endorsing active issues with hallucinations either visual or auditory. Daughter is at the  bedside and verbalize concerns over patient's psychiatric medications and how they interact with her other medications. Daughter updated on concerns regarding dehydration and recent issues with hypotension. Aware patient is stable to  transfer out of stepdown today. Also verbalizes concerns over patient's hair falling out which is a new problem. She states that her sugars are well controlled at home however, currently is not able to tell me her dose of 70/30.    Objective: Blood pressure 113/73, pulse 54, temperature 98.2 F (36.8 C), temperature source Oral, resp. rate 25, height 6\' 2"  (1.88 m), weight 110 kg (242 lb 8.1 oz), SpO2 96.00%.  Intake/Output Summary (Last 24 hours) at 10/31/11 1200 Last data filed at 10/31/11 1100  Gross per 24 hour  Intake   2860 ml  Output   1600 ml  Net   1260 ml     Exam: General: No acute respiratory distress Lungs: Clear to auscultation bilaterally without wheezes or crackles Cardiovascular: Regular rate and rhythm without murmur gallop or rub normal S1 and S2 Abdomen: Nontender, nondistended, soft, bowel sounds positive, no rebound, no ascites, no appreciable mass-Flexi-Seal with light brown stool Musculoskeletal: No significant cyanosis, clubbing of extremities Neurological: Patient alert and oriented at least to name place and year. Moves all extremities x4 without any focal neurological deficits appreciated  Data Reviewed: Basic Metabolic Panel:  Lab 10/31/11 2841 10/30/11 0838 10/29/11 0400 10/28/11 0500 10/27/11 0355 10/26/11 0310  NA 138 135 146* 149* 147* --  K 4.0 4.1 3.9 4.8 4.3 --  CL 101 95* 103 108 106 --  CO2 27 30 32 29 32 --  GLUCOSE 325* 404* 248* 301* 316* --  BUN 25* 26* 28* 28* 21 --  CREATININE 0.80 0.80 0.82 0.78 0.77 --  CALCIUM 10.1 10.4 11.1* 11.1* 10.3 --  MG -- 1.7 2.0 2.0 1.7 1.7  PHOS -- 3.4 4.7* 4.4 2.9 1.5*   CBC:  Lab 10/30/11 0838 10/29/11 0400 10/28/11 0500 10/27/11 0355 10/26/11 0310  WBC 8.8 12.6* 10.7*  10.3 12.1*  NEUTROABS -- -- -- -- --  HGB 13.3 12.4 11.6* 11.7* 10.9*  HCT 39.8 38.8 36.3 36.9 33.6*  MCV 92.3 95.1 95.3 95.1 93.9  PLT 443* 419* 372 358 276   CBG:  Lab 10/31/11 0816 10/30/11 2106 10/30/11 1725 10/30/11 1453 10/30/11 1144  GLUCAP 304* 264* 248* 243* 325*    Recent Results (from the past 240 hour(s))  CULTURE, BLOOD (ROUTINE X 2)     Status: Normal   Collection Time   10/22/11  9:44 AM      Component Value Range Status Comment   Specimen Description BLOOD LEFT HAND   Final    Special Requests     Final    Value: BOTTLES DRAWN AEROBIC AND ANAEROBIC 6CC EACH BOTTLE   Culture NO GROWTH 5 DAYS   Final    Report Status 10/27/2011 FINAL   Final   CULTURE, BLOOD (ROUTINE X 2)     Status: Normal   Collection Time   10/22/11  9:47 AM      Component Value Range Status Comment   Specimen Description BLOOD LEFT ARM   Final    Special Requests BOTTLES DRAWN AEROBIC ONLY 8CC BOTTLE   Final    Culture NO GROWTH 5 DAYS   Final    Report Status 10/27/2011 FINAL   Final   URINE CULTURE     Status: Normal   Collection Time   10/22/11 10:12 AM      Component Value Range Status Comment   Specimen Description URINE, CATHETERIZED   Final    Special Requests NONE   Final    Culture  Setup Time 10/22/2011 21:27  Final    Colony Count NO GROWTH   Final    Culture NO GROWTH   Final    Report Status 10/24/2011 FINAL   Final   CLOSTRIDIUM DIFFICILE BY PCR     Status: Normal   Collection Time   10/22/11  6:14 PM      Component Value Range Status Comment   C difficile by pcr NEGATIVE  NEGATIVE Final   STOOL CULTURE     Status: Normal   Collection Time   10/22/11  6:14 PM      Component Value Range Status Comment   Specimen Description STOOL   Final    Special Requests NONE   Final    Culture     Final    Value: NO SALMONELLA, SHIGELLA, CAMPYLOBACTER, YERSINIA, OR E.COLI 0157:H7 ISOLATED   Report Status 10/27/2011 FINAL   Final   MRSA PCR SCREENING     Status: Normal   Collection  Time   10/22/11 11:38 PM      Component Value Range Status Comment   MRSA by PCR NEGATIVE  NEGATIVE Final   CLOSTRIDIUM DIFFICILE BY PCR     Status: Normal   Collection Time   10/23/11 10:04 AM      Component Value Range Status Comment   C difficile by pcr NEGATIVE  NEGATIVE Final   CULTURE, BLOOD (ROUTINE X 2)     Status: Normal   Collection Time   10/24/11 11:20 AM      Component Value Range Status Comment   Specimen Description BLOOD HAND LEFT   Final    Special Requests BOTTLES DRAWN AEROBIC ONLY 5CC   Final    Culture  Setup Time 10/24/2011 20:09   Final    Culture NO GROWTH 5 DAYS   Final    Report Status 10/30/2011 FINAL   Final   CULTURE, BLOOD (ROUTINE X 2)     Status: Normal   Collection Time   10/24/11 11:35 AM      Component Value Range Status Comment   Specimen Description BLOOD HAND RIGHT   Final    Special Requests BOTTLES DRAWN AEROBIC ONLY 8CC   Final    Culture  Setup Time 10/24/2011 20:09   Final    Culture NO GROWTH 5 DAYS   Final    Report Status 10/30/2011 FINAL   Final   CULTURE, RESPIRATORY     Status: Normal   Collection Time   10/24/11 12:10 PM      Component Value Range Status Comment   Specimen Description OTHER   Final    Special Requests ETT SUCTION   Final    Gram Stain     Final    Value: ABUNDANT WBC PRESENT, PREDOMINANTLY PMN     NO SQUAMOUS EPITHELIAL CELLS SEEN     RARE GRAM POSITIVE COCCI IN PAIRS   Culture     Final    Value: ABUNDANT NEISSERIA MENINGITIDIS     Note: CRITICAL RESULT CALLED TO, READ BACK BY AND VERIFIED WITH: SABO A@9 :50AM ON 10/27/11 BY DANTS BETA LACTAMASE NEGATIVE     Note: CORRECTED RESULTS CALLED TO: SABO A@9 :50AM ON 10/27/11 BY DANTS PREVIOUSLY REPORTED AS BETA LACTAMASE POSITIVE   Report Status 10/30/2011 FINAL   Final   URINE CULTURE     Status: Normal   Collection Time   10/24/11  1:27 PM      Component Value Range Status Comment   Specimen Description URINE, CATHETERIZED   Final  Special Requests NONE   Final     Culture  Setup Time 10/24/2011 22:06   Final    Colony Count >=100,000 COLONIES/ML   Final    Culture ESCHERICHIA COLI   Final    Report Status 10/26/2011 FINAL   Final    Organism ID, Bacteria ESCHERICHIA COLI   Final      Studies:  Recent x-ray studies have been reviewed in detail by the Attending Physician  Scheduled Meds:  Reviewed in detail by the Attending Physician   Junious Silk, ANP [facility] Office  [phone] Pager [phone]  On-Call/Text Page:      [email or web address]      password [email or web address]  If 7PM-7AM, please contact night-coverage [email or web address] Password [email or web address] 10/31/2011, 12:00 PM   LOS: 9 days     I have examined the patient and reviewed the chart. I have modified the above note and agree with it.  Calvert Cantor, MD [phone]

## 2011-10-31 NOTE — Progress Notes (Signed)
Attempted to see patient today with Psych MD and discussed case with Psych MD along with notes from LOS meeting. Unable to meet with patient due to procedure, but will follow up with patient tomorrow if appropriate.   Psych MD and CSW will remain on case and follow.  Ashley Jacobs, MSW LCSW [phone]

## 2011-10-31 NOTE — Progress Notes (Signed)
Inpatient Diabetes Program Recommendations  AACE/ADA: New Consensus Statement on Inpatient Glycemic Control (2013)  Target Ranges:  Prepandial:   less than 140 mg/dL      Peak postprandial:   less than 180 mg/dL (1-2 hours)      Critically ill patients:  140 - 180 mg/dL   Reason for Visit: Sustained hyperglycemia  Inpatient Diabetes Program Recommendations Insulin - Basal: xxx Insulin - Meal Coverage: xxxxxxxxxxx HgbA1C: Referral requesting insulin dosing.  Please check A1C to assess control prior to hospitalization. Outpatient Referral: xxxxxxxxxxxxxxx  Note: Noted the plan for restarting 70/30 insulin.  Pt got 15 units Novolog this am and 30 units NPH which would be consistent with 45 units of 70/30 this am and into mid-day.Pt takes 60 units 70/30 in the am at home. No correction is presently ordered nor the supper dose of 70/30.  Recommend at least 30-35 units 70/30 with supper to start.  Given her extreme hyperglycemia, I would suspect patient will need close to full 70/30 home doses. Please order correction insulin as well, moderate to start plus HS scale. Thank you, Lenor Coffin, RN, CNS, Diabetes Coordinator [phone])

## 2011-10-31 NOTE — Progress Notes (Signed)
PT IV due. Unsuccessful attempt of restart by RN. Call placed to IV team. They are aware that IV needs to be changed and will change in AM. Will report to AM nurse.

## 2011-10-31 NOTE — Consult Note (Signed)
Patient Identification:  Emily Cordova Date of Evaluation:  10/31/2011 Reason for Consult:Schizophrenia, Lithium Toxicity  Referring Provider: Dr. Butler Denmark  History of Present Illness:pt and daughter live in her home.  She developed altered menttal status and was brought to the DD. She was found to have lithium toxicity of a level greater than 4. She wears immediately sent to hemodialysis and since, the lithium level has slowly approached  therapeutic level  Past Psychiatric History: patient is brought in with a diagnosis of bipolar disorder taking lithium. She has been going to community mental health service. She denies active drug and alcohol use. She does referred to a former period of time where she did drink and use marijuana. She smokes cigarettes and is craving one today. She agrees to nicotine patch but says she still wants to smoke a cigarette.   Past Medical History:     Past Medical History  Diagnosis Date  . Schizophrenia   . Bipolar disorder   . Diabetes mellitus   . Hypercholesteremia   . Hypertension   . Kidney stone        Past Surgical History  Procedure Date  . Cholecystectomy   . Tubal ligation     Allergies:  Allergies  Allergen Reactions  . Etomidate Other (See Comments)    Severe Myoclonus: SE  . Haldol (Haloperidol Decanoate)     Current Medications:  Prior to Admission medications   Medication Sig Start Date End Date Taking? Authorizing Provider  benztropine (COGENTIN) 2 MG tablet Take 2 mg by mouth 2 (two) times daily.   Yes Historical Provider, MD  Calcium Citrate-Vitamin D (CALCIUM CITRATE + D) 315-250 MG-UNIT TABS Take 1 tablet by mouth 2 (two) times daily.   Yes Historical Provider, MD  cholecalciferol (VITAMIN D) 1000 UNITS tablet Take 6,000 Units by mouth Cordova morning.   Yes Historical Provider, MD  hydrochlorothiazide (HYDRODIURIL) 25 MG tablet Take 25 mg by mouth Cordova morning.   Yes Historical Provider, MD  insulin aspart  protamine-insulin aspart (NOVOLOG 70/30) (70-30) 100 UNIT/ML injection Inject 40-60 Units into the skin 2 (two) times daily with a meal. Inject 60 units at breakfast and 40 units at supper.   Yes Historical Provider, MD  lisinopril (PRINIVIL,ZESTRIL) 40 MG tablet Take 40 mg by mouth daily.   Yes Historical Provider, MD  lithium carbonate 300 MG capsule Take 900 mg by mouth 2 (two) times daily with a meal.   Yes Historical Provider, MD  metFORMIN (GLUCOPHAGE-XR) 500 MG 24 hr tablet Take 1,000 mg by mouth 2 (two) times daily.   Yes Historical Provider, MD  metoprolol tartrate (LOPRESSOR) 25 MG tablet Take 12.5 mg by mouth 2 (two) times daily.   Yes Historical Provider, MD  OLANZapine (ZYPREXA) 10 MG tablet Take 10 mg by mouth at bedtime.   Yes Historical Provider, MD  Omega-3 Fatty Acids (OMEGA-3 FISH OIL) 500 MG CAPS Take 500 mg by mouth 2 (two) times daily.   Yes Historical Provider, MD  Paliperidone Palmitate (INVEGA SUSTENNA) 234 MG/1.5ML SUSP Inject 234 mg into the muscle Cordova 28 (twenty-eight) days.    Yes Historical Provider, MD  simvastatin (ZOCOR) 20 MG tablet Take 20 mg by mouth at bedtime.   Yes Historical Provider, MD  traZODone (DESYREL) 100 MG tablet Take 100 mg by mouth at bedtime.   Yes Historical Provider, MD    Social History:    reports that she has been smoking.  She does not have any smokeless tobacco history on  file. She reports that she does not drink alcohol or use illicit drugs.   Family History:    History reviewed. No pertinent family history.  Mental Status Examination/Evaluation: Objective:  Appearance: Casual and Obese  Psychomotor Activity:  Normal  Eye Contact::  Good  Speech:  Clear and Coherent and Slow with a heavy accent  Volume:  Normal  Mood:  Depressed and Dysphoric  Affect:  Congruent and Depressed  Thought Process:  Coherent, Relevant, Intact and Focused on grieving for her recently deceased husband.  Orientation:  Other:  Patient is oriented to  person and place she thinks it's February 2013  Thought Content:  Grief, focused on grandchild   Suicidal Thoughts:  No  Homicidal Thoughts:  No  Judgement:  Fair  Insight:  Fair    DIAGNOSIS:   AXIS I   Bipolar disorder, depressed; bereavement for her spouse   AXIS II  Deffered  AXIS III See medical notes.  AXIS IV economic problems, other psychosocial or environmental problems, problems related to social environment and problems with primary support group  problems with management of medical treatment regimen   AXIS V 51-60 moderate symptoms   Assessment/Plan:  discussed with Psych CSW, RN   patient is sitting in a chair she has good eye contact and she has spontaneous speech. She knows her diagnosis of bipolar disorder and knows that something was mixed up regarding her lithium she knows she has diabetes. Her mental status exam demonstrates that she has difficulty with calculations and abstract reasoning. She has some difficulty spelling world either forwards or backwards. She has general basic knowledge that it is believed she can function in her limited home environment. She has lived in her home for some time and recently lost her husband  2 cancer. She declares he was too young to die. She feels lonesome and findings some solace in the presence of her granddaughter and daughter.  The occurrence of lithium toxicity raises a very serious question about her ability to manage her diabetes and bipolar disorder. If there is any reason she must continue on lithium of the doctor prescribing that medication needs to provide an explanation. Otherwise, I think it is more safe to use one of the other medications to stabilize mood and prevent depression there are other medications that have been clinically demonstrated to treat bipolar depression whereas lithium is known to protect against suicide. She declares that she has tried to overdose or cut her wrists 3 times. The very thought of overdose in the  fact she is taking lithium is a toxic potentially lethal combination.  RECOMMENDATION:   1.   Suggest discontinuation of lithium slowly.   2.   Suggest home health for this patient to assure supervision of medication and appropriate dispensing. It appears that the daughter can be responsible for supervising the medication if home health is to general supervisor of setting up the medications as they need to be taken   3.   Continue with Zyprexa Zydis 10 mg daily  4.   Consider BuSpar 10 mg 3 times daily for anxiety. 5.   No further needs identified unless requested. M.D. Psychiatrist signs off Mukesh Kornegay J. Ferol Luz, MD Psychiatrist  10/31/2011 11:07 AM

## 2011-11-01 LAB — GLUCOSE, CAPILLARY: Glucose-Capillary: 265 mg/dL — ABNORMAL HIGH (ref 70–99)

## 2011-11-01 LAB — BASIC METABOLIC PANEL
CO2: 26 mEq/L (ref 19–32)
Chloride: 103 mEq/L (ref 96–112)
Creatinine, Ser: 0.67 mg/dL (ref 0.50–1.10)
GFR calc Af Amer: >90 mL/min (ref >90)
Potassium: 3.7 mEq/L (ref 3.5–5.1)
Sodium: 136 mEq/L (ref 135–145)

## 2011-11-01 LAB — CBC
MCV: 90.3 fL (ref 78.0–100.0)
Platelets: 378 10*3/uL (ref 150–400)
RBC: 4.14 MIL/uL (ref 3.87–5.11)
RDW: 12 % (ref 11.5–15.5)
WBC: 8.1 10*3/uL (ref 4.0–10.5)

## 2011-11-01 MED ORDER — BUSPIRONE HCL 10 MG PO TABS
10.0000 mg | ORAL_TABLET | Freq: Three times a day (TID) | ORAL | Status: DC
  Administered 2011-11-01 – 2011-11-02 (×2): 10 mg via ORAL
  Filled 2011-11-01 (×4): qty 1

## 2011-11-01 NOTE — Progress Notes (Signed)
Due to procedure and unable to meet with patient on 8/27. Patient seen by Psych MD regarding needs and consult. Psych MD reports she did get to see patient late in the day and completed assessment.  No psych recommendations at this time. Patient recommended home health and patient compliant and agreeable. Per review of chart, CM is aware and completed.  Will sign off for now. If psych needs arise please re-consult. [phone]  Ashley Jacobs, MSW LCSW [phone]

## 2011-11-01 NOTE — Progress Notes (Addendum)
Inpatient Diabetes Program Recommendations   AACE/ADA:   New Consensus Statement on Inpatient Glycemic Control (2013)  Target Ranges:   Prepandial: less than 140 mg/dL  Peak postprandial:   less than 180 mg/dL (1-2 hours)  Critically ill patients:   140 - 180 mg/dL     Results for PORCHIA, SINKLER (MRN [id number]) as of 11/01/2011 11:23  Ref. Range 10/31/2011 08:16 10/31/2011 12:06 10/31/2011 17:05 10/31/2011 22:27  Glucose-Capillary Latest Range: 70-99 mg/dL 409 (H) 811 (H) 914 (H) 206 (H)   Results for SHLOKA, BALDRIDGE (MRN [id number]) as of 11/01/2011 11:23  Ref. Range 11/01/2011 08:00  Glucose-Capillary Latest Range: 70-99 mg/dL 086 (H)   Inpatient Diabetes Program Recommendations   Noted 70/30 insulin (home doses) started last night at supper.  Please add Novolog Moderate correction scale (SSi) tid ac + HS.  Adding SSi may help [place] to titrate patient's 70/30 insulin doses as we can detrmine how much extra insulin patient is requiring. Patient may need further adjustments of her 70/30 insulin for home.  Addendum 1500pm:  Spoke to patient about her diabetes care at home.  Patient told me she lives with her 2 daughters and that they usually help her with insulin administration at home.  Patient told me she uses the insulin pen at home and that her daughters have no problems giving her the injections.    Patient told me she does a lot of cooking at home and tries to follow "my diet".    Will continue to follow and assist as needed.  Ambrose Finland RN, MSN, CDE Diabetes Coordinator Inpatient Diabetes Program [phone]

## 2011-11-01 NOTE — Progress Notes (Signed)
Physical Therapy Treatment Patient Details Name: Emily Cordova MRN: [id number] DOB: 02/22/1971 Today's Date: 11/01/2011 Time: 4098-1191 PT Time Calculation (min): 20 min  PT Assessment / Plan / Recommendation Comments on Treatment Session  Patient progressing towards goals. Still slightly impaired with safety awareness and impulsivness. Patient with no LOB with ambulation this session. Stated that daughters are with her all the time. Eager and motivated to walk better and return home    Follow Up Recommendations  Home health PT;Supervision/Assistance - 24 hour    Barriers to Discharge        Equipment Recommendations  Rolling walker with 5" wheels    Recommendations for Other Services    Frequency Min 3X/week   Plan Discharge plan remains appropriate;Frequency remains appropriate    Precautions / Restrictions Precautions Precautions: Fall   Pertinent Vitals/Pain     Mobility  Transfers Sit to Stand: 4: Min guard;With upper extremity assist;With armrests Stand to Sit: 4: Min guard;With upper extremity assist;With armrests Details for Transfer Assistance: slightly impulsive Ambulation/Gait Ambulation/Gait Assistance: 4: Min guard Ambulation Distance (Feet): 300 Feet Assistive device: Rolling walker Ambulation/Gait Assistance Details: Cues for safety with RW and to avoid obstacles. Patient easily distracted with gait requiring cues to stay focused on safety and RW with gait Gait Pattern: Step-through pattern;Decreased stride length    Exercises     PT Diagnosis:    PT Problem List:   PT Treatment Interventions:     PT Goals Acute Rehab PT Goals PT Transfer Goal: Bed to Chair/Chair to Bed - Progress: Progressing toward goal PT Goal: Ambulate - Progress: Progressing toward goal  Visit Information  Last PT Received On: 11/01/11 Assistance Needed: +2 (follow with chair)    Subjective Data      Cognition  Overall Cognitive Status: Impaired Area of Impairment:  Memory;Attention;Safety/judgement Arousal/Alertness: Awake/alert Orientation Level: Appears intact for tasks assessed Behavior During Session: [facility] for tasks performed Current Attention Level: Sustained Following Commands: Follows one step commands with increased time    Balance     End of Session PT - End of Session Equipment Utilized During Treatment: Gait belt Activity Tolerance: Patient tolerated treatment well Patient left: in chair;with call bell/phone within reach Nurse Communication: Mobility status   GP     Fredrich Birks 11/01/2011, 11:58 AM 11/01/2011 Fredrich Birks PTA [phone] pager [phone] office

## 2011-11-01 NOTE — Progress Notes (Signed)
[facility] PROGRESS NOTE  Emily Cordova ZOX:[id number] DOB: 28-Dec-1970 DOA: 10/22/2011 PCP: Avon Gully, MD  Assessment/Plan: Principal Problem:  *Lithium toxicity without renal failure Active Problems:  Bipolar 1 disorder  Altered mental status  Acute respiratory failure with hypoxia due to sedation/meds  Diabetes mellitus type 2, uncontrolled  HTN (hypertension)     1. Lithium toxicity:  Patient presented with altered mental status/altered behavior, and was found on initial evaluation, to have a serum Lithium level of 4.0. It is unclear if patient inadvertently misunderstood titrating instructions or took purposeful overdose of medications over a period of time. Creatinine was 1.85, with BUN of 31, at presentation. She was managed with emergent hemo-dialysis, and clinical response was satisfactory. Renal indices are now within normal limits, with creatinine of 0.67 and BUN 26, on 11/01/11.  Last lithium level 0.33 on 10/27/2011, and Lithium is still on hold. Dr Mickeal Skinner provided psychiatry consultation and has recommended discontinuation of lithium slowly, home health for this patient to assure supervision of medication and appropriate dispensing. It appears that the daughter can be responsible for supervising the medication if home health is to general supervisor of setting up the medications as they need to be taken. Continue with Zyprexa Zydis 10 mg daily. Consider BuSpar 10 mg 3 times daily for anxiety. This was commenced on 11/01/11.  2. Hypotension/dehydration:   This was evident on admission and was managed with NS bolus, with resolution. However, on 10/30/11, patient developed hypotension after administration of antihypertensive medications and has since responded to fluid administration and holding medications.   3. Acute respiratory failure with hypoxia due to sedation/meds:   Patient was intubate on 10/23/11, for airway protection and hypoxic respiratory failure,  against a background of altered mental status, due to Lithium toxicity and possible aspiration. This has resolved.   4. Bipolar 1 disorder:   See discussion in #1 above. Appreciate psychiatrist recommendations.  5. Ecoli UTI vs/ colonizer: This was an incidental finding on urine culture. Patient was treated with a 3-day course of antibiotics, per ID recommendations.   6. Neisseria meningitidis in sputum:   This was isolated on chocolate agar. Per ID, likely a colonizer of her upper airway, as on admission she had fairly clear lungs without evidence of any acquired pneumonia making Neisseria meningitidis pneumonia unlikely. She also has an alternative explanation for altered mental status with her lithium overdose, making Neisseria meningitidis meningitis is highly unlikely. No antibiotic treatment was recommended.  7. Diabetes mellitus type 2, uncontrolled: Patient was on Metformin prior to admission, which has been discontinued.  She has been recommenced on Novolog 70/30 insulin. Following CBGs, and titrating as indicated.  *can resume Metformin in AM if sugars are still uncontrolled  8. HTN (hypertension):   BP is controlled at this time. Attempt to resume Lisinopril on 10/30/11, resulted in hypotension that was symptomatic and resolved with administration of IV fluids. Lisinopril and Hydrodiuril remain on hold.      Code Status: Full Code. Family Communication:  Disposition Plan: Aim possible discharge with HHPT/Supervision/assisstance-24 hours.   Brief narrative: 41 year old female patient with long-standing psychiatric history on multiple medications including lithium. She presented to an outside hospital with altered mentation. Subsequently was found to have a lithium level greater than 4 and was transferred to [facility] to initiate emergent dialysis. She did not have associated renal failure. According to the patient's daughter the psychiatrist has been titrating the patient's  psychiatric medications recently. Upon arrival to the ICU the patient was  minimally responsive and subsequently required intubation to protect her airway. She was also emergently placed on hemodialysis through a catheter. She has now been extubated and tolerated this well. The dialysis catheter was removed on 10/26/2011. She was subsequently deemed appropriate to transfer to the step down unit. [facility] assumed care of this patient on 10/30/2011.   Consultants:  Dr Mickeal Skinner, psychiatrist.  Dr Paulette Blanch Dam, infectious diseases specialist.  Dr Malen Gauze, nephrologist.   Procedures:  Serial Chest X-rays.   Antibiotics:  Unasyn 10/24/11-10/25/11.   Rocephin 10/26/11-10/17/11.   HPI/Subjective: No new issues.   Objective: Vital signs in last 24 hours: Temp:  [97 F (36.1 C)-98.9 F (37.2 C)] 98.1 F (36.7 C) (08/28 1316) Pulse Rate:  [52-72] 72  (08/28 1316) Resp:  [20-22] 20  (08/28 1316) BP: (120-137)/(61-75) 137/75 mmHg (08/28 1316) SpO2:  [95 %-100 %] 97 % (08/28 1316) Weight:  [114.987 kg (253 lb 8 oz)] 114.987 kg (253 lb 8 oz) (08/28 0620) Weight change: 4.987 kg (10 lb 15.9 oz) Last BM Date: 10/29/11  Intake/Output from previous day: 08/27 0701 - 08/28 0700 In: 1340 [P.O.:240; I.V.:1100] Out: 4430 [Urine:4430] Total I/O In: 980 [P.O.:480; I.V.:500] Out: 1800 [Urine:1800]   Physical Exam: General: Comfortable, alert, communicative, fully oriented, not short of breath at rest.  HEENT:  No clinical pallor, no jaundice, no conjunctival injection or discharge. Hydration is satisfactory.  NECK:  Supple, JVP not seen, no carotid bruits, no palpable lymphadenopathy, no palpable goiter. CHEST:  Clinically clear to auscultation, no wheezes, no crackles. HEART:  Sounds 1 and 2 heard, normal, regular, no murmurs. ABDOMEN:  Obese, soft, non-tender, no palpable organomegaly, no palpable masses, normal bowel sounds. GENITALIA:  Not examined. LOWER EXTREMITIES:  No  pitting edema, palpable peripheral pulses. MUSCULOSKELETAL SYSTEM:  Unremarkable. CENTRAL NERVOUS SYSTEM:  No focal neurologic deficit on gross examination.  Lab Results:  Basename 11/01/11 0530 10/30/11 0838  WBC 8.1 8.8  HGB 12.3 13.3  HCT 37.4 39.8  PLT 378 443*    Basename 11/01/11 0530 10/31/11 0701  NA 136 138  K 3.7 4.0  CL 103 101  CO2 26 27  GLUCOSE 206* 325*  BUN 14 25*  CREATININE 0.67 0.80  CALCIUM 9.9 10.1   Recent Results (from the past 240 hour(s))  CLOSTRIDIUM DIFFICILE BY PCR     Status: Normal   Collection Time   10/22/11  6:14 PM      Component Value Range Status Comment   C difficile by pcr NEGATIVE  NEGATIVE Final   STOOL CULTURE     Status: Normal   Collection Time   10/22/11  6:14 PM      Component Value Range Status Comment   Specimen Description STOOL   Final    Special Requests NONE   Final    Culture     Final    Value: NO SALMONELLA, SHIGELLA, CAMPYLOBACTER, YERSINIA, OR E.COLI 0157:H7 ISOLATED   Report Status 10/27/2011 FINAL   Final   MRSA PCR SCREENING     Status: Normal   Collection Time   10/22/11 11:38 PM      Component Value Range Status Comment   MRSA by PCR NEGATIVE  NEGATIVE Final   CLOSTRIDIUM DIFFICILE BY PCR     Status: Normal   Collection Time   10/23/11 10:04 AM      Component Value Range Status Comment   C difficile by pcr NEGATIVE  NEGATIVE Final   CULTURE, BLOOD (ROUTINE X 2)  Status: Normal   Collection Time   10/24/11 11:20 AM      Component Value Range Status Comment   Specimen Description BLOOD HAND LEFT   Final    Special Requests BOTTLES DRAWN AEROBIC ONLY 5CC   Final    Culture  Setup Time 10/24/2011 20:09   Final    Culture NO GROWTH 5 DAYS   Final    Report Status 10/30/2011 FINAL   Final   CULTURE, BLOOD (ROUTINE X 2)     Status: Normal   Collection Time   10/24/11 11:35 AM      Component Value Range Status Comment   Specimen Description BLOOD HAND RIGHT   Final    Special Requests BOTTLES DRAWN AEROBIC  ONLY 8CC   Final    Culture  Setup Time 10/24/2011 20:09   Final    Culture NO GROWTH 5 DAYS   Final    Report Status 10/30/2011 FINAL   Final   CULTURE, RESPIRATORY     Status: Normal   Collection Time   10/24/11 12:10 PM      Component Value Range Status Comment   Specimen Description OTHER   Final    Special Requests ETT SUCTION   Final    Gram Stain     Final    Value: ABUNDANT WBC PRESENT, PREDOMINANTLY PMN     NO SQUAMOUS EPITHELIAL CELLS SEEN     RARE GRAM POSITIVE COCCI IN PAIRS   Culture     Final    Value: ABUNDANT NEISSERIA MENINGITIDIS     Note: CRITICAL RESULT CALLED TO, READ BACK BY AND VERIFIED WITH: SABO A@9 :50AM ON 10/27/11 BY DANTS BETA LACTAMASE NEGATIVE     Note: CORRECTED RESULTS CALLED TO: SABO A@9 :50AM ON 10/27/11 BY DANTS PREVIOUSLY REPORTED AS BETA LACTAMASE POSITIVE   Report Status 10/30/2011 FINAL   Final   URINE CULTURE     Status: Normal   Collection Time   10/24/11  1:27 PM      Component Value Range Status Comment   Specimen Description URINE, CATHETERIZED   Final    Special Requests NONE   Final    Culture  Setup Time 10/24/2011 22:06   Final    Colony Count >=100,000 COLONIES/ML   Final    Culture ESCHERICHIA COLI   Final    Report Status 10/26/2011 FINAL   Final    Organism ID, Bacteria ESCHERICHIA COLI   Final      Studies/Results: No results found.  Medications: Scheduled Meds:   . benztropine  1.5 mg Oral BID  . heparin  5,000 Units Subcutaneous Q8H  . insulin aspart protamine-insulin aspart  40 Units Subcutaneous Q supper  . insulin aspart protamine-insulin aspart  60 Units Subcutaneous Q breakfast  . living well with diabetes book   Does not apply Once  . OLANZapine zydis  10 mg Oral Daily   Continuous Infusions:   . sodium chloride 50 mL/hr at 11/01/11 0022   PRN Meds:.acetaminophen (TYLENOL) oral liquid 160 mg/5 mL, albuterol, alum & mag hydroxide-simeth, ondansetron    LOS: 10 days   Erina Hamme,CHRISTOPHER  [facility] Pager [phone]. If 8PM-8AM, please contact night-coverage at [email or web address], password [facility] 11/01/2011, 4:25 PM  LOS: 10 days

## 2011-11-01 NOTE — Discharge Summary (Signed)
Physician Discharge Summary  Emily STRENG FAO:[id number] DOB: 07/22/1970 DOA: 10/22/2011  PCP: Avon Gully, MD  Admit date: 10/22/2011 Discharge date: 11/02/2011  Recommendations for Outpatient Follow-up:  1. Follow up with primary MD, and with primary psychiatrist.   Discharge Diagnoses:  Principal Problem:  *Lithium toxicity without renal failure Active Problems:  Bipolar 1 disorder  Altered mental status  Acute respiratory failure with hypoxia due to sedation/meds  Diabetes mellitus type 2, uncontrolled  HTN (hypertension)   Discharge Condition: Satisfactory.   Diet recommendation: Heart-healthy/carbohydrate-Modified.   Filed Weights   10/31/11 0453 11/01/11 0620 11/02/11 0559  Weight: 110 kg (242 lb 8.1 oz) 114.987 kg (253 lb 8 oz) 115 kg (253 lb 8.5 oz)    History of present illness:  41 year old female patient with long-standing psychiatric history on multiple medications including lithium. She presented to an outside hospital with altered mentation. Subsequently was found to have a lithium level greater than 4 and was transferred to [facility] to initiate emergent dialysis. She did not have associated renal failure. According to the patient's daughter the psychiatrist has been titrating the patient's psychiatric medications recently. Upon arrival to the ICU the patient was minimally responsive and subsequently required intubation to protect her airway. She was also emergently placed on hemodialysis through a catheter. She has now been extubated and tolerated this well. The dialysis catheter was removed on 10/26/2011. She was subsequently deemed appropriate to transfer to the step down unit. [facility] assumed care of this patient on 10/30/2011.   Hospital Course:  1. Lithium toxicity:  Patient presented with altered mental status/altered behavior, and was found on initial evaluation, to have a serum Lithium level of 4.0. It is unclear if patient inadvertently misunderstood  titrating instructions or took purposeful overdose of medications over a period of time. Creatinine was 1.85, with BUN of 31, at presentation. She was managed with emergent hemo-dialysis, and clinical response was satisfactory. Renal indices normalized, with creatinine of 0.67 and BUN 26, as of 11/01/11. Last lithium level was 0.33 on 10/27/2011, and Lithium is still on hold. Dr Mickeal Skinner provided psychiatry consultation and has recommended discontinuation of lithium slowly, home health for this patient to assure supervision of medication and appropriate dispensing. It appears that the daughter can be responsible for supervising the medication if home health is the general supervisor of setting up the medications as they need to be taken. Continue with Zyprexa Zydis 10 mg daily. Consider BuSpar 10 mg 3 times daily for anxiety. This was commenced on 11/01/11. Patient will be discharged on 300 mg of Lithium, B.I.D.  2. Hypotension/dehydration:  This was evident on admission and was managed with NS bolus, with resolution. However, on 10/30/11, patient developed hypotension after administration of antihypertensive medications and has since responded to fluid administration and holding medications. Metoprolol was reinstated on 11/02/11, as BP had started creeping up. We shall defer reinstatement of other anti-hypertensive medications to PMD, on follow up.  3. Acute respiratory failure with hypoxia due to sedation/meds:  Patient was intubated on 10/23/11, for airway protection and hypoxic respiratory failure, against a background of altered mental status, due to Lithium toxicity and and concern for possible aspiration. This has resolved.  4. Bipolar 1 disorder:  See discussion in #1 above. Appreciate psychiatrist recommendations.  5. Ecoli UTI vs/ colonizer:  This was an incidental finding on urine culture. Patient was treated with a 3-day course of antibiotics, per ID recommendations.  6. Neisseria meningitidis  in sputum:  This was isolated on  chocolate agar. Per ID, likely a colonizer of her upper airway, as on admission she had fairly clear lungs without evidence of any acquired pneumonia making Neisseria meningitidis pneumonia unlikely. She also has an alternative explanation for altered mental status with her lithium overdose, making Neisseria meningitidis meningitis is highly unlikely. No antibiotic treatment was recommended.  7. Diabetes mellitus type 2, uncontrolled:  Patient was on Metformin prior to admission, which was held due to her acute metabolic derangements. She has been recommenced on Novolog 70/30 insulin, titrated as indicated, with satisfactory response. Metformin will be recommenced on discharge.  8. HTN (hypertension):  BP is controlled at this time. Attempt to resume Lisinopril on 10/30/11, resulted in hypotension that was symptomatic and resolved with administration of IV fluids. Lisinopril and Hydrodiuril and Metoprolol, remain on hold.       Procedures:  See Below.   Consultations: Dr Mickeal Skinner, psychiatrist.  Dr Paulette Blanch Dam, infectious diseases specialist.  Dr Malen Gauze, nephrologist.   Discharge Exam: Filed Vitals:   11/02/11 0559  BP: 151/87  Pulse: 54  Temp: 98 F (36.7 C)  Resp: 20   Filed Vitals:   11/01/11 0620 11/01/11 1316 11/01/11 2136 11/02/11 0559  BP: 126/61 137/75 141/77 151/87  Pulse: 55 72 53 54  Temp: 98.2 F (36.8 C) 98.1 F (36.7 C) 97.7 F (36.5 C) 98 F (36.7 C)  TempSrc: Oral Oral Oral Oral  Resp: 22 20 20 20   Height:      Weight: 114.987 kg (253 lb 8 oz)   115 kg (253 lb 8.5 oz)  SpO2: 98% 97% 98% 100%    General: Comfortable, alert, communicative, fully oriented, not short of breath at rest.  HEENT: No clinical pallor, no jaundice, no conjunctival injection or discharge. Hydration is satisfactory.  NECK: Supple, JVP not seen, no carotid bruits, no palpable lymphadenopathy, no palpable goiter.  CHEST: Clinically  clear to auscultation, no wheezes, no crackles.  HEART: Sounds 1 and 2 heard, normal, regular, no murmurs.  ABDOMEN: Obese, soft, non-tender, no palpable organomegaly, no palpable masses, normal bowel sounds.  GENITALIA: Not examined.  LOWER EXTREMITIES: No pitting edema, palpable peripheral pulses.  MUSCULOSKELETAL SYSTEM: Unremarkable.  CENTRAL NERVOUS SYSTEM: No focal neurologic deficit on gross examination.  Discharge Instructions  Discharge Orders    Future Orders Please Complete By Expires   Diet - low sodium heart healthy      Diet Carb Modified      Increase activity slowly        Medication List  As of 11/02/2011 11:57 AM   STOP taking these medications         hydrochlorothiazide 25 MG tablet      lisinopril 40 MG tablet      lithium carbonate 300 MG capsule         TAKE these medications         benztropine 2 MG tablet   Commonly known as: COGENTIN   Take 2 mg by mouth 2 (two) times daily.      busPIRone 10 MG tablet   Commonly known as: BUSPAR   Take 1 tablet (10 mg total) by mouth 3 (three) times daily.      CALCIUM CITRATE + D 315-250 MG-UNIT Tabs   Generic drug: Calcium Citrate-Vitamin D   Take 1 tablet by mouth 2 (two) times daily.      cholecalciferol 1000 UNITS tablet   Commonly known as: VITAMIN D   Take 6,000 Units by mouth every  morning.      insulin aspart protamine-insulin aspart (70-30) 100 UNIT/ML injection   Commonly known as: NOVOLOG 70/30   Inject 40-60 Units into the skin 2 (two) times daily with a meal. Inject 60 units at breakfast and 40 units at supper.      INVEGA SUSTENNA 234 MG/1.5ML Susp   Generic drug: Paliperidone Palmitate   Inject 234 mg into the muscle every 28 (twenty-eight) days.      lithium 300 MG tablet   Take 1 tablet (300 mg total) by mouth 2 (two) times daily.      metFORMIN 500 MG 24 hr tablet   Commonly known as: GLUCOPHAGE-XR   Take 1,000 mg by mouth 2 (two) times daily.      metoprolol tartrate 25 MG  tablet   Commonly known as: LOPRESSOR   Take 12.5 mg by mouth 2 (two) times daily.      OLANZapine 10 MG tablet   Commonly known as: ZYPREXA   Take 10 mg by mouth at bedtime.      Omega-3 Fish Oil 500 MG Caps   Take 500 mg by mouth 2 (two) times daily.      simvastatin 20 MG tablet   Commonly known as: ZOCOR   Take 20 mg by mouth at bedtime.      traZODone 100 MG tablet   Commonly known as: DESYREL   Take 100 mg by mouth at bedtime.           Follow-up Information    Follow up with [facility], [facility].   Contact information:   [address] [place] [address] [phone]       Please follow up. (Folow up with primary psychiatrist. as needed)           The results of significant diagnostics from this hospitalization (including imaging, microbiology, ancillary and laboratory) are listed below for reference.    Significant Diagnostic Studies: Dg Chest Port 1 View  10/29/2011  *RADIOLOGY REPORT*  Clinical Data: Respiratory failure.  PORTABLE CHEST - 1 VIEW  Comparison: 10/27/2011  Findings: Endotracheal tube and nasogastric have been removed. Stable enlargement of the cardiac silhouette.  Prominent central vascular markings with peribronchial thickening.  Findings may represent mild interstitial edema.  Minimal change from the previous examination.  IMPRESSION: Removal of support apparatuses without focal chest disease.  Evidence for mild congestion or interstitial edema.   Original Report Authenticated By: Richarda Overlie, M.D.    Dg Chest Port 1 View  10/27/2011  *RADIOLOGY REPORT*  Clinical Data: Endotracheal tube placement.  PORTABLE CHEST - 1 VIEW  Comparison: 1 day prior  Findings: Endotracheal tube unchanged, with tip 4.0 cm above carina.  Nasogastric extends beyond the  inferior aspect of the film.  Numerous leads and wires project over the chest.  Mildly degraded exam due to AP portable technique and patient body habitus.  Cardiomegaly accentuated by  AP portable technique.  No right and no definite left pleural effusion.  Lung volumes are low.  Overall improved aeration, with mild interstitial edema remaining. Improved right base aeration.  Persistent left base air space disease.  IMPRESSION:  1.  Overall improved aeration, with decreased interstitial edema. 2.  Left base airspace disease, likely atelectasis.   Original Report Authenticated By: Consuello Bossier, M.D.    Dg Chest Port 1 View  10/26/2011  *RADIOLOGY REPORT*  Clinical Data: ET tube placement.  PORTABLE CHEST - 1 VIEW  Comparison: 10/25/2011.  Findings: Low lung volumes.  Cardiomegaly persists.  Moderate pulmonary edema unchanged.  Focal opacity at the right base could represent superimposed pneumonia.  Endotracheal tube, hemodialysis catheter, and nasogastric tube appear unchanged given the degree of rotation on today's exam.  IMPRESSION: Cardiomegaly with moderate pulmonary edema persist.  Cannot exclude superimposed right lower lobe pneumonia.  Tubes and lines unchanged.   Original Report Authenticated By: Elsie Stain, M.D.    Dg Chest Port 1 View  10/25/2011  *RADIOLOGY REPORT*  Clinical Data: Endotracheal tube placement  PORTABLE CHEST - 1 VIEW  Comparison: Portable chest x-ray of 10/16/2011  Findings: The tip of the endotracheal tube remains in good position above the carina.  Right IJ central venous line tip overlies the mid SVC.  The lungs are poorly aerated and there is cardiomegaly present with probable mild edema.  IMPRESSION: No change in position endotracheal tube and right central venous line.  Cardiomegaly and probable mild edema.   Original Report Authenticated By: Juline Patch, M.D.    Dg Chest Port 1 View  10/24/2011  *RADIOLOGY REPORT*  Clinical Data: Endotracheal tube placement.  PORTABLE CHEST - 1 VIEW  Comparison: 10/23/2011  Findings: Endotracheal tube remains appropriately position, 2.8 cm above carina.  Nasogastric tube extends beyond the  inferior aspect of the  film.  Large bore right IJ line is unchanged with tip at low SVC.  Numerous leads and wires project over the chest.  Cardiomegaly accentuated by AP portable technique.  Cannot exclude small layering left pleural effusion. No pneumothorax.  Lung volumes extremely low.  This accentuates the pulmonary interstitium. Patchy right-sided atelectasis is similar.  There is worsened left base aeration.  Similar mild interstitial edema.  IMPRESSION:  1.  Worsening left lower lobe aeration.  Cannot exclude developing infection or aspiration. 2.  Low lung volumes with similar mild interstitial edema.   Original Report Authenticated By: Consuello Bossier, M.D.    Dg Chest Port 1 View  10/23/2011  *RADIOLOGY REPORT*  Clinical Data: Endotracheal tube placement.  PORTABLE CHEST - 1 VIEW  Comparison: 10/22/2011  Findings: Shallow inspiration.  Mild cardiac enlargement with increasing pulmonary vascular congestion since previous study. Linear atelectasis in the mid lungs.  There has been interval placement of an endotracheal tube with tip about 2.7 cm above the carina.  A right central venous catheter has been placed with the tip projected over the mid SVC.  No pneumothorax.  No blunting of costophrenic angles.  IMPRESSION: Developing pulmonary vascular congestion and linear atelectasis in the lungs.  Endotracheal tube tip is about 2.7 cm above the carina. Right central venous catheter tip is projected over the mid SVC.  Original Report Authenticated By: Marlon Pel, M.D.   Dg Chest Port 1 View  10/22/2011  *RADIOLOGY REPORT*  Clinical Data: Chest congestion.  Altered mental status. Persistent shaking.  PORTABLE CHEST - 1 VIEW  Comparison: 05/15/2010  Findings: Mildly degraded exam due to AP portable technique and patient body habitus.  Midline trachea.  Normal heart size for level of inspiration.  No congestive failure.  No pleural effusion or pneumothorax.  Mildly low lung volumes, without focal opacity.  IMPRESSION:  Cardiomegaly and low lung volumes, without acute disease.  Original Report Authenticated By: Consuello Bossier, M.D.    Microbiology: Recent Results (from the past 240 hour(s))  CULTURE, BLOOD (ROUTINE X 2)     Status: Normal   Collection Time   10/24/11 11:20 AM      Component Value Range Status Comment  Specimen Description BLOOD HAND LEFT   Final    Special Requests BOTTLES DRAWN AEROBIC ONLY 5CC   Final    Culture  Setup Time 10/24/2011 20:09   Final    Culture NO GROWTH 5 DAYS   Final    Report Status 10/30/2011 FINAL   Final   CULTURE, BLOOD (ROUTINE X 2)     Status: Normal   Collection Time   10/24/11 11:35 AM      Component Value Range Status Comment   Specimen Description BLOOD HAND RIGHT   Final    Special Requests BOTTLES DRAWN AEROBIC ONLY 8CC   Final    Culture  Setup Time 10/24/2011 20:09   Final    Culture NO GROWTH 5 DAYS   Final    Report Status 10/30/2011 FINAL   Final   CULTURE, RESPIRATORY     Status: Normal   Collection Time   10/24/11 12:10 PM      Component Value Range Status Comment   Specimen Description OTHER   Final    Special Requests ETT SUCTION   Final    Gram Stain     Final    Value: ABUNDANT WBC PRESENT, PREDOMINANTLY PMN     NO SQUAMOUS EPITHELIAL CELLS SEEN     RARE GRAM POSITIVE COCCI IN PAIRS   Culture     Final    Value: ABUNDANT NEISSERIA MENINGITIDIS     Note: CRITICAL RESULT CALLED TO, READ BACK BY AND VERIFIED WITH: SABO A@9 :50AM ON 10/27/11 BY DANTS BETA LACTAMASE NEGATIVE     Note: CORRECTED RESULTS CALLED TO: SABO A@9 :50AM ON 10/27/11 BY DANTS PREVIOUSLY REPORTED AS BETA LACTAMASE POSITIVE   Report Status 10/30/2011 FINAL   Final   URINE CULTURE     Status: Normal   Collection Time   10/24/11  1:27 PM      Component Value Range Status Comment   Specimen Description URINE, CATHETERIZED   Final    Special Requests NONE   Final    Culture  Setup Time 10/24/2011 22:06   Final    Colony Count >=100,000 COLONIES/ML   Final    Culture  ESCHERICHIA COLI   Final    Report Status 10/26/2011 FINAL   Final    Organism ID, Bacteria ESCHERICHIA COLI   Final      Labs: Basic Metabolic Panel:  Lab 11/02/11 1610 11/01/11 0530 10/31/11 0701 10/30/11 0838 10/29/11 0400 10/28/11 0500 10/27/11 0355  NA 136 136 138 135 146* -- --  K 3.7 3.7 4.0 4.1 3.9 -- --  CL 101 103 101 95* 103 -- --  CO2 27 26 27 30  32 -- --  GLUCOSE 198* 206* 325* 404* 248* -- --  BUN 10 14 25* 26* 28* -- --  CREATININE 0.69 0.67 0.80 0.80 0.82 -- --  CALCIUM 10.1 9.9 10.1 10.4 11.1* -- --  MG -- -- -- 1.7 2.0 2.0 1.7  PHOS -- -- -- 3.4 4.7* 4.4 2.9   Liver Function Tests: No results found for this basename: AST:5,ALT:5,ALKPHOS:5,BILITOT:5,PROT:5,ALBUMIN:5 in the last 168 hours No results found for this basename: LIPASE:5,AMYLASE:5 in the last 168 hours No results found for this basename: AMMONIA:5 in the last 168 hours CBC:  Lab 11/01/11 0530 10/30/11 0838 10/29/11 0400 10/28/11 0500 10/27/11 0355  WBC 8.1 8.8 12.6* 10.7* 10.3  NEUTROABS -- -- -- -- --  HGB 12.3 13.3 12.4 11.6* 11.7*  HCT 37.4 39.8 38.8 36.3 36.9  MCV 90.3 92.3 95.1 95.3  95.1  PLT 378 443* 419* 372 358   Cardiac Enzymes: No results found for this basename: CKTOTAL:5,CKMB:5,CKMBINDEX:5,TROPONINI:5 in the last 168 hours BNP: BNP (last 3 results) No results found for this basename: PROBNP:3 in the last 8760 hours CBG:  Lab 11/02/11 0733 11/01/11 2127 11/01/11 1735 11/01/11 1227 11/01/11 0800  GLUCAP 270* 265* 239* 259* 296*    Time coordinating discharge: 40 minutes  Signed:  Zamauri Nez,CHRISTOPHER  [facility] 11/02/2011, 11:57 AM

## 2011-11-02 LAB — GLUCOSE, CAPILLARY
Glucose-Capillary: 270 mg/dL — ABNORMAL HIGH (ref 70–99)
Glucose-Capillary: 247 mg/dL — ABNORMAL HIGH (ref 70–99)

## 2011-11-02 LAB — BASIC METABOLIC PANEL
CO2: 27 mEq/L (ref 19–32)
Chloride: 101 mEq/L (ref 96–112)
Potassium: 3.7 mEq/L (ref 3.5–5.1)
Sodium: 136 mEq/L (ref 135–145)

## 2011-11-02 MED ORDER — LITHIUM CARBONATE 300 MG PO TABS: 300.0000 mg | ORAL_TABLET | Freq: Two times a day (BID) | ORAL | Status: DC

## 2011-11-02 MED ORDER — BUSPIRONE HCL 10 MG PO TABS: 10.0000 mg | ORAL_TABLET | Freq: Three times a day (TID) | ORAL | Status: AC

## 2011-11-02 NOTE — Progress Notes (Signed)
DC home with family. Pt states she understands DC instructions.

## 2012-02-05 ENCOUNTER — Other Ambulatory Visit (HOSPITAL_COMMUNITY): Payer: Self-pay | Admitting: Cardiovascular Disease

## 2012-02-05 DIAGNOSIS — I10 Essential (primary) hypertension: Secondary | ICD-10-CM

## 2012-02-05 DIAGNOSIS — R079 Chest pain, unspecified: Secondary | ICD-10-CM

## 2012-02-05 DIAGNOSIS — R9431 Abnormal electrocardiogram [ECG] [EKG]: Secondary | ICD-10-CM

## 2012-02-05 DIAGNOSIS — R0602 Shortness of breath: Secondary | ICD-10-CM

## 2012-02-20 ENCOUNTER — Encounter (HOSPITAL_COMMUNITY): Payer: Self-pay

## 2012-02-20 ENCOUNTER — Ambulatory Visit (HOSPITAL_COMMUNITY): Payer: Self-pay

## 2012-03-10 ENCOUNTER — Encounter (HOSPITAL_COMMUNITY): Payer: Self-pay

## 2012-03-10 ENCOUNTER — Emergency Department (HOSPITAL_COMMUNITY)
Admission: EM | Admit: 2012-03-10 | Discharge: 2012-03-10 | Disposition: A | Discharge status: Home / Self Care | Payer: Medicaid Other | Attending: Emergency Medicine | Admitting: Emergency Medicine

## 2012-03-10 DIAGNOSIS — Z87442 Personal history of urinary calculi: Secondary | ICD-10-CM | POA: Insufficient documentation

## 2012-03-10 DIAGNOSIS — R350 Frequency of micturition: Secondary | ICD-10-CM | POA: Insufficient documentation

## 2012-03-10 DIAGNOSIS — R300 Dysuria: Secondary | ICD-10-CM | POA: Insufficient documentation

## 2012-03-10 DIAGNOSIS — M549 Dorsalgia, unspecified: Secondary | ICD-10-CM

## 2012-03-10 DIAGNOSIS — F319 Bipolar disorder, unspecified: Secondary | ICD-10-CM | POA: Insufficient documentation

## 2012-03-10 DIAGNOSIS — Z3202 Encounter for pregnancy test, result negative: Secondary | ICD-10-CM | POA: Insufficient documentation

## 2012-03-10 DIAGNOSIS — H9209 Otalgia, unspecified ear: Secondary | ICD-10-CM | POA: Insufficient documentation

## 2012-03-10 DIAGNOSIS — M545 Low back pain, unspecified: Secondary | ICD-10-CM | POA: Insufficient documentation

## 2012-03-10 DIAGNOSIS — Z79899 Other long term (current) drug therapy: Secondary | ICD-10-CM | POA: Insufficient documentation

## 2012-03-10 DIAGNOSIS — F17200 Nicotine dependence, unspecified, uncomplicated: Secondary | ICD-10-CM | POA: Insufficient documentation

## 2012-03-10 DIAGNOSIS — F209 Schizophrenia, unspecified: Secondary | ICD-10-CM | POA: Insufficient documentation

## 2012-03-10 DIAGNOSIS — H9190 Unspecified hearing loss, unspecified ear: Secondary | ICD-10-CM | POA: Insufficient documentation

## 2012-03-10 DIAGNOSIS — I10 Essential (primary) hypertension: Secondary | ICD-10-CM | POA: Insufficient documentation

## 2012-03-10 DIAGNOSIS — Z794 Long term (current) use of insulin: Secondary | ICD-10-CM | POA: Insufficient documentation

## 2012-03-10 DIAGNOSIS — E119 Type 2 diabetes mellitus without complications: Secondary | ICD-10-CM | POA: Insufficient documentation

## 2012-03-10 DIAGNOSIS — E7800 Pure hypercholesterolemia, unspecified: Secondary | ICD-10-CM | POA: Insufficient documentation

## 2012-03-10 DIAGNOSIS — H6120 Impacted cerumen, unspecified ear: Secondary | ICD-10-CM | POA: Insufficient documentation

## 2012-03-10 LAB — URINALYSIS, ROUTINE W REFLEX MICROSCOPIC
Bilirubin Urine: NEGATIVE
Hgb urine dipstick: NEGATIVE
Nitrite: NEGATIVE
Urobilinogen, UA: 0.2 mg/dL (ref 0.0–1.0)

## 2012-03-10 MED ORDER — ANTIPYRINE-BENZOCAINE 5.4-1.4 % OT SOLN
3.0000 [drp] | Freq: Once | OTIC | Status: AC
  Administered 2012-03-10: 4 [drp] via OTIC
  Filled 2012-03-10: qty 10

## 2012-03-10 MED ORDER — CEPHALEXIN 500 MG PO CAPS: 500.0000 mg | ORAL_CAPSULE | Freq: Four times a day (QID) | ORAL | Status: DC

## 2012-03-10 NOTE — ED Notes (Signed)
Complain of back pain since this morning. Thinks she has a UTI

## 2012-03-10 NOTE — ED Notes (Signed)
Irrigated both ears multiple times w/sterile water.  Large solid cerumen chunks removed from both ears.  Patient states she can hear better now.  Slight amount of bleeding from L ear.  Notified T. Triplett, PA who evaluated ears.

## 2012-03-10 NOTE — ED Notes (Signed)
Patient with no complaints at this time. Respirations even and unlabored. Skin warm/dry. Discharge instructions reviewed with patient at this time. Patient given opportunity to voice concerns/ask questions. Patient discharged at this time and left Emergency Department with steady gait.   

## 2012-03-11 LAB — URINE CULTURE: Colony Count: 50000

## 2012-03-12 NOTE — ED Provider Notes (Signed)
History     [name]: [id number]  Arrival date & time 03/10/12  1256   First MD Initiated Contact with Patient 03/10/12 1332      Chief Complaint  Patient presents with  . Back Pain    (Consider location/radiation/quality/duration/timing/severity/associated sxs/prior treatment) HPI Comments: Patient with an extensive psychological hx, c/o low back pain and left ear pain that began gradually several hrs PTA.  States she woke up with pain to her lower back.  She denies known injury.  Describes the pain as aching.  She also c/o frequent urination and is concerned that she may have a UTI.  She also c/o increased pain to her left ear and decreased hearing for several weeks.  She denies hematuria, abd pain, vaginal d/c or bleeding, fever, vomiting, incontinence, saddle anesthesia's, extremity numbness of weakness.    Pt denies any psychosis, SI or HI thoughts.  States "I got my head straight now"  Patient is a 42 y.o. female presenting with back pain. The history is provided by the patient.  Back Pain  This is a new problem. The current episode started 6 to 12 hours ago. The problem occurs constantly. The problem has not changed since onset.The pain is associated with no known injury. The pain is present in the lumbar spine. The quality of the pain is described as aching. The pain does not radiate. The pain is moderate. The symptoms are aggravated by bending, twisting and certain positions. Associated symptoms include dysuria. Pertinent negatives include no chest pain, no fever, no numbness, no headaches, no abdominal pain, no abdominal swelling, no bowel incontinence, no perianal numbness, no bladder incontinence, no pelvic pain, no leg pain, no paresthesias, no paresis, no tingling and no weakness. Associated symptoms comments: Left ear pain. She has tried nothing for the symptoms. The treatment provided no relief. Risk factors include obesity.    Past Medical History  Diagnosis Date  . Schizophrenia    . Bipolar disorder   . Diabetes mellitus   . Hypercholesteremia   . Hypertension   . Kidney stone     Past Surgical History  Procedure Date  . Cholecystectomy   . Tubal ligation     No family history on file.  History  Substance Use Topics  . Smoking status: Current Every Day Smoker -- 1.0 packs/day  . Smokeless tobacco: Not on file  . Alcohol Use: No    OB History    Grav Para Term Preterm Abortions TAB SAB Ect Mult Living                  Review of Systems  Constitutional: Negative for fever and activity change.  HENT: Positive for hearing loss and ear pain. Negative for sore throat, facial swelling, trouble swallowing, neck pain, neck stiffness and ear discharge.   Respiratory: Negative for chest tightness and shortness of breath.   Cardiovascular: Negative for chest pain.  Gastrointestinal: Negative for nausea, vomiting, abdominal pain, constipation, abdominal distention and bowel incontinence.  Genitourinary: Positive for dysuria and urgency. Negative for bladder incontinence, hematuria, flank pain, decreased urine volume, vaginal bleeding, vaginal discharge, difficulty urinating, genital sores, vaginal pain and pelvic pain.  Musculoskeletal: Positive for back pain.  Skin: Negative for rash and wound.  Neurological: Negative for dizziness, tingling, weakness, numbness, headaches and paresthesias.  All other systems reviewed and are negative.    Allergies  Etomidate and Haldol  Home Medications   Current Outpatient Rx  Name  Route  Sig  Dispense  Refill  .  BENZTROPINE MESYLATE 2 MG PO TABS   Oral   Take 2 mg by mouth 2 (two) times daily.         . BUSPIRONE HCL 10 MG PO TABS   Oral   Take 1 tablet (10 mg total) by mouth 3 (three) times daily.   90 tablet   0   . INSULIN ASPART PROT & ASPART (70-30) 100 UNIT/ML [place] SUSP   Subcutaneous   Inject 30 Units into the skin 2 (two) times daily with a meal.          . LITHIUM CARBONATE 300 MG PO TABS    Oral   Take 300 mg by mouth 2 (two) times daily.         Marland Kitchen METFORMIN HCL ER 500 MG PO TB24   Oral   Take 1,000 mg by mouth 2 (two) times daily.         Marland Kitchen METOPROLOL TARTRATE 25 MG PO TABS   Oral   Take 12.5 mg by mouth 2 (two) times daily.         Marland Kitchen OLANZAPINE 10 MG PO TABS   Oral   Take 10 mg by mouth at bedtime.         Marland Kitchen PALIPERIDONE PALMITATE 234 MG/1.5ML IM SUSP   Intramuscular   Inject 234 mg into the muscle every 28 (twenty-eight) days.          Marland Kitchen SIMVASTATIN 20 MG PO TABS   Oral   Take 20 mg by mouth at bedtime.         . TRAZODONE HCL 100 MG PO TABS   Oral   Take 100 mg by mouth at bedtime.         . CEPHALEXIN 500 MG PO CAPS   Oral   Take 1 capsule (500 mg total) by mouth 4 (four) times daily. For 7 days   28 capsule   0     BP 149/80  Pulse 81  Temp 97.7 F (36.5 C) (Oral)  Ht 5\' 4"  (1.626 m)  Wt 244 lb (110.678 kg)  BMI 41.88 kg/m2  SpO2 98%  Physical Exam  Nursing note and vitals reviewed. Constitutional: She is oriented to person, place, and time. She appears well-developed and well-nourished. No distress.  HENT:  Head: Normocephalic and atraumatic.  Right Ear: External ear normal. No drainage. No mastoid tenderness. No hemotympanum.  Left Ear: External ear normal. No drainage. No mastoid tenderness. No hemotympanum.  Mouth/Throat: Uvula is midline, oropharynx is clear and moist and mucous membranes are normal.       Large amt of cerumen bilaterally TM 's not visualized.  Neck: Normal range of motion. Neck supple.  Cardiovascular: Normal rate, regular rhythm, normal heart sounds and intact distal pulses.   No murmur heard. Pulmonary/Chest: Effort normal and breath sounds normal.  Abdominal: Soft. She exhibits no distension and no mass. There is no tenderness. There is no rebound and no guarding.  Musculoskeletal: She exhibits tenderness. She exhibits no edema.       Lumbar back: She exhibits tenderness and pain. She exhibits  normal range of motion, no swelling, no deformity, no laceration and normal pulse.       Back:       ttp of the lumbar paraspinal muscles.  No edema, DP pulses are equal and brisk, distal sensation intact  Neurological: She is alert and oriented to person, place, and time. No cranial nerve deficit or sensory deficit. She exhibits normal muscle  tone. Coordination and gait normal.  Reflex Scores:      Patellar reflexes are 2+ on the right side and 2+ on the left side.      Achilles reflexes are 2+ on the right side and 2+ on the left side. Skin: Skin is warm and dry.    ED Course  Procedures (including critical care time)  Labs Reviewed  URINALYSIS, ROUTINE W REFLEX MICROSCOPIC - Abnormal; Notable for the following:    APPearance HAZY (*)     Specific Gravity, Urine >1.030 (*)     Ketones, ur TRACE (*)     Protein, ur 100 (*)     All other components within normal limits  URINE MICROSCOPIC-ADD ON - Abnormal; Notable for the following:    Squamous Epithelial / LPF MANY (*)     Crystals CA OXALATE CRYSTALS (*)     All other components within normal limits  PREGNANCY, URINE  URINE CULTURE  LAB REPORT - SCANNED     1. Cerumen impaction   2. Dysuria   3. Back pain     Urine culture pending  MDM    Both ears were irrigated by the nursing staff with large amt of cerumen removed.  Hearing restored.  Pain improved.  Pt feeling better and tolerated procedure well.    Dispensed auralgan otic drops and urine is likely contaminate but will start keflex.  Pt and family member agree to f/u with her PMD        Syriah Delisi L. Jarrad Mclees, [place] 03/12/12 1411

## 2012-03-12 NOTE — ED Notes (Signed)
+   Urine Patient treated with Keflex-sensitive to same-chart appended per protocol MD. 

## 2012-03-13 NOTE — ED Provider Notes (Signed)
Medical screening examination/treatment/procedure(s) were performed by non-physician practitioner and as supervising physician I was immediately available for consultation/collaboration.   Kayann Maj L Kelly Ranieri, MD 03/13/12 0708 

## 2012-06-02 ENCOUNTER — Emergency Department (HOSPITAL_COMMUNITY)
Admission: EM | Admit: 2012-06-02 | Discharge: 2012-06-03 | Disposition: A | Discharge status: Home / Self Care | Payer: Medicaid Other | Attending: Emergency Medicine | Admitting: Emergency Medicine

## 2012-06-02 ENCOUNTER — Encounter (HOSPITAL_COMMUNITY): Payer: Self-pay | Admitting: *Deleted

## 2012-06-02 DIAGNOSIS — Z008 Encounter for other general examination: Secondary | ICD-10-CM | POA: Insufficient documentation

## 2012-06-02 DIAGNOSIS — E119 Type 2 diabetes mellitus without complications: Secondary | ICD-10-CM | POA: Insufficient documentation

## 2012-06-02 DIAGNOSIS — Z79899 Other long term (current) drug therapy: Secondary | ICD-10-CM | POA: Insufficient documentation

## 2012-06-02 DIAGNOSIS — F22 Delusional disorders: Secondary | ICD-10-CM

## 2012-06-02 DIAGNOSIS — F209 Schizophrenia, unspecified: Secondary | ICD-10-CM | POA: Insufficient documentation

## 2012-06-02 DIAGNOSIS — I10 Essential (primary) hypertension: Secondary | ICD-10-CM | POA: Insufficient documentation

## 2012-06-02 DIAGNOSIS — Z794 Long term (current) use of insulin: Secondary | ICD-10-CM | POA: Insufficient documentation

## 2012-06-02 DIAGNOSIS — F29 Unspecified psychosis not due to a substance or known physiological condition: Secondary | ICD-10-CM

## 2012-06-02 DIAGNOSIS — F319 Bipolar disorder, unspecified: Secondary | ICD-10-CM | POA: Insufficient documentation

## 2012-06-02 DIAGNOSIS — F060 Psychotic disorder with hallucinations due to known physiological condition: Secondary | ICD-10-CM | POA: Insufficient documentation

## 2012-06-02 LAB — CBC WITH DIFFERENTIAL/PLATELET
Eosinophils Absolute: 0.2 10*3/uL (ref 0.0–0.7)
Eosinophils Relative: 2 % (ref 0–5)
Hemoglobin: 14.6 g/dL (ref 12.0–15.0)
Lymphs Abs: 3 10*3/uL (ref 0.7–4.0)
MCH: 31.5 pg (ref 26.0–34.0)
MCV: 88.6 fL (ref 78.0–100.0)
Monocytes Absolute: 0.6 10*3/uL (ref 0.1–1.0)
Monocytes Relative: 5 % (ref 3–12)
RBC: 4.64 MIL/uL (ref 3.87–5.11)

## 2012-06-02 LAB — BASIC METABOLIC PANEL
BUN: 8 mg/dL (ref 6–23)
Calcium: 9.9 mg/dL (ref 8.4–10.5)
GFR calc non Af Amer: >90 mL/min (ref >90)
Glucose, Bld: 275 mg/dL — ABNORMAL HIGH (ref 70–99)
Potassium: 3.4 mEq/L — ABNORMAL LOW (ref 3.5–5.1)

## 2012-06-02 MED ORDER — PALIPERIDONE PALMITATE 234 MG/1.5ML IM SUSP: 234.0000 mg | INTRAMUSCULAR | Status: DC

## 2012-06-02 MED ORDER — LORAZEPAM 1 MG PO TABS
1.0000 mg | ORAL_TABLET | Freq: Three times a day (TID) | ORAL | Status: DC | PRN
  Filled 2012-06-02: qty 1

## 2012-06-02 MED ORDER — IBUPROFEN 400 MG PO TABS: 600.0000 mg | ORAL_TABLET | Freq: Three times a day (TID) | ORAL | Status: DC | PRN

## 2012-06-02 MED ORDER — BENZTROPINE MESYLATE 1 MG PO TABS
2.0000 mg | ORAL_TABLET | Freq: Two times a day (BID) | ORAL | Status: DC
  Administered 2012-06-03 (×2): 2 mg via ORAL
  Filled 2012-06-02 (×2): qty 2

## 2012-06-02 MED ORDER — INSULIN ASPART PROT & ASPART (70-30 MIX) 100 UNIT/ML ~~LOC~~ SUSP
30.0000 [IU] | Freq: Two times a day (BID) | SUBCUTANEOUS | Status: DC
  Filled 2012-06-02: qty 10

## 2012-06-02 MED ORDER — METFORMIN HCL ER 500 MG PO TB24
1000.0000 mg | ORAL_TABLET | Freq: Two times a day (BID) | ORAL | Status: DC
  Administered 2012-06-03: 1000 mg via ORAL

## 2012-06-02 MED ORDER — NICOTINE 21 MG/24HR TD PT24: 21.0000 mg | MEDICATED_PATCH | Freq: Every day | TRANSDERMAL | Status: DC

## 2012-06-02 MED ORDER — SIMVASTATIN 20 MG PO TABS
20.0000 mg | ORAL_TABLET | Freq: Every day | ORAL | Status: DC
  Administered 2012-06-03: 20 mg via ORAL
  Filled 2012-06-02 (×2): qty 1

## 2012-06-02 MED ORDER — LITHIUM CARBONATE 150 MG PO CAPS
300.0000 mg | ORAL_CAPSULE | Freq: Two times a day (BID) | ORAL | Status: DC
  Administered 2012-06-03 (×2): 300 mg via ORAL
  Filled 2012-06-02 (×4): qty 2

## 2012-06-02 MED ORDER — METOPROLOL TARTRATE 25 MG PO TABS
12.5000 mg | ORAL_TABLET | Freq: Two times a day (BID) | ORAL | Status: DC
  Administered 2012-06-03: 12.5 mg via ORAL
  Filled 2012-06-02: qty 1

## 2012-06-02 MED ORDER — BUSPIRONE HCL 5 MG PO TABS
10.0000 mg | ORAL_TABLET | Freq: Three times a day (TID) | ORAL | Status: DC
  Administered 2012-06-03 (×2): 10 mg via ORAL
  Filled 2012-06-02 (×5): qty 1

## 2012-06-02 MED ORDER — TRAZODONE HCL 50 MG PO TABS
100.0000 mg | ORAL_TABLET | Freq: Every day | ORAL | Status: DC
  Administered 2012-06-03: 100 mg via ORAL
  Filled 2012-06-02 (×2): qty 1

## 2012-06-02 MED ORDER — ACETAMINOPHEN 325 MG PO TABS: 650.0000 mg | ORAL_TABLET | ORAL | Status: DC | PRN

## 2012-06-02 MED ORDER — ONDANSETRON HCL 4 MG PO TABS: 4.0000 mg | ORAL_TABLET | Freq: Three times a day (TID) | ORAL | Status: DC | PRN

## 2012-06-02 MED ORDER — OLANZAPINE 5 MG PO TABS
10.0000 mg | ORAL_TABLET | Freq: Every day | ORAL | Status: DC
  Administered 2012-06-03: 10 mg via ORAL
  Filled 2012-06-02 (×2): qty 1
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
## 2012-06-02 NOTE — ED Notes (Signed)
Pt states her nerves are bad. When asked if pt wants to hurt herself or anyone else, she just say's "I don't know." Pt just answers everything, "I don't know." Pt very agitated. Pt acts like she is out of it, doesn't know anything.

## 2012-06-02 NOTE — ED Notes (Signed)
Patient reports driving herself to ED.  States medication was changed three days ago.  Now her head feels "full and she feels "groggy"   Does not like this feeling.  States she was doing fine before her meds were changed and not sure why "they" changed her medication

## 2012-06-03 LAB — GLUCOSE, CAPILLARY
Glucose-Capillary: 215 mg/dL — ABNORMAL HIGH (ref 70–99)
Glucose-Capillary: 208 mg/dL — ABNORMAL HIGH (ref 70–99)

## 2012-06-03 LAB — RAPID URINE DRUG SCREEN, HOSP PERFORMED
Amphetamines: NOT DETECTED
Barbiturates: NOT DETECTED
Benzodiazepines: NOT DETECTED
Cocaine: NOT DETECTED
Opiates: NOT DETECTED
Tetrahydrocannabinol: NOT DETECTED

## 2012-06-03 LAB — URINALYSIS, ROUTINE W REFLEX MICROSCOPIC
Specific Gravity, Urine: >1.03 — ABNORMAL HIGH (ref 1.005–1.030)
Urobilinogen, UA: 0.2 mg/dL (ref 0.0–1.0)

## 2012-06-03 LAB — URINE MICROSCOPIC-ADD ON

## 2012-06-03 LAB — LITHIUM LEVEL: Lithium Lvl: 0.38 mEq/L — ABNORMAL LOW (ref 0.80–1.40)

## 2012-06-03 MED ORDER — TRAZODONE HCL 50 MG PO TABS
ORAL_TABLET | ORAL | Status: AC
  Filled 2012-06-03: qty 2

## 2012-06-03 MED ORDER — OLANZAPINE 5 MG PO TABS
ORAL_TABLET | ORAL | Status: AC
  Filled 2012-06-03: qty 2

## 2012-06-03 MED ORDER — METFORMIN HCL ER 500 MG PO TB24
1000.0000 mg | ORAL_TABLET | Freq: Two times a day (BID) | ORAL | Status: DC
  Administered 2012-06-03: 1000 mg via ORAL
  Filled 2012-06-03 (×3): qty 2

## 2012-06-03 MED ORDER — SIMVASTATIN 20 MG PO TABS
ORAL_TABLET | ORAL | Status: AC
  Filled 2012-06-03: qty 1

## 2012-06-03 MED ORDER — METFORMIN HCL ER 500 MG PO TB24
ORAL_TABLET | ORAL | Status: AC
  Filled 2012-06-03: qty 2

## 2012-06-03 MED ORDER — LITHIUM CARBONATE 300 MG PO CAPS
ORAL_CAPSULE | ORAL | Status: AC
  Filled 2012-06-03: qty 1

## 2012-06-03 MED ORDER — BUSPIRONE HCL 5 MG PO TABS
ORAL_TABLET | ORAL | Status: AC
  Filled 2012-06-03: qty 2

## 2012-06-03 NOTE — ED Provider Notes (Signed)
Patient reports she was feeling anxious at home. She felt like her medications were too strong. However she relates this morning she is feeling better. She was evaluated this morning by Orvilla Fus, ACT team. He feels she may be at her baseline this morning. He's going to talk to her ACT team and have them come see patient. He has talked to her psych nurse and she states her last invega injection was on the 21st. She has been lithium toxic in the past.   Patient is alert sitting in a bedside chair watching TV. She is eating her breakfast. Patient maintains good eye contact. She states she's feeling improved. She actually is more interactive than I have seen her in the past.  Patient may be able to go home if her daily ACT team agrees that she is back to her baseline and can be followed up daily at home.  [name], MD 06/03/12 [phone]

## 2012-06-03 NOTE — ED Notes (Signed)
Pt wakes to eat breakfast tray.

## 2012-06-03 NOTE — ED Provider Notes (Signed)
Patient has been seen by her own ACT team, they feel she is at her baseline and can be discharged per [place], ACT  [name], MD 06/03/12 1600

## 2012-06-03 NOTE — ED Notes (Signed)
Patient has been sitting in chair in room - did not want to get onto stretcher.  Encouraged to move to stretcher as she has been nodding- initially refused but with repeated requests did comply

## 2012-06-03 NOTE — ED Notes (Signed)
AC obtaining HS medications from pharmacy.   Patient sitting in chair beside stretcher, awake.  Sitter observing from bedside.

## 2012-06-03 NOTE — ED Notes (Signed)
Sitting quietly at bedside, prefers to sit in bed side chair.  Appetite appears poor with regards to amount of breakfast tray consumed.

## 2012-06-03 NOTE — ED Notes (Signed)
Patient awake and up to chair in room, c/o being hungry and requesting something to eat.  Slightly diaphoretic, visible tremors of hands as earlier upon arrival.  States she feels shakey, CBG checked - "patient accused RN of trying to take her blood from her because she has good blood", offered peanut butter cheese cracker and sprite zero.  Advised breakfast will be served in about 2 1/2 hours.  Remains sitting in chair at stretcher side, rocking back and forth.  Had one outburst towards nurse tech and RN, states both are not really NT and RN.

## 2012-06-03 NOTE — ED Notes (Signed)
Diet Sprite (Zero) given along with one pack of graham crackers and peanut butter to patient with her night time meds.

## 2012-06-03 NOTE — ED Notes (Signed)
Informed by ACT team counselor that a representative from [facility] would be here to evaluate Emily Cordova to see if she is able to return home.

## 2012-06-03 NOTE — ED Notes (Signed)
Pt resting with eyes closed, snoring loudly at times.

## 2012-06-03 NOTE — ED Notes (Signed)
Novolog 70/30 held per MD order.

## 2012-06-03 NOTE — BH Assessment (Addendum)
Assessment Note   Emily Cordova is an 42 y.o. female. The patient came to the ED last night with complaints of paranoia, thinking that someone is trying to break into her home. She says something is wrong but cannot be more specific. She is not suicidal nor homicidal. She appears to be somewhat paranoid. This am she sis calm and coop[erative. She reports she is feeling better. She is not thinking that someone is trying to break into her home. Called Diane RN with [facility] ACT Team. She saw Emily Cordova on Thursday May 30, 2012. She found her to be helping with the grand children. She is cleaning her home and washing cloths. Asked that ACT visit the patient in the  The ED. Diane will talk with her supervisor Robinette Haines. She will call back. Discussed with DR Lynelle Doctor and explained that we felt the patient was near baseline and that we had requested [facility] Team to come and evaluate the patient. Dr Lynelle Doctor is in agreement that the patient is doing well and would await the Evaluatuation by [facility] ACT members.  Axis I:  Schizophrenia, chronic, paranoid type Axis II: Deferred Axis III:  Past Medical History  Diagnosis Date  . Schizophrenia   . Bipolar disorder   . Diabetes mellitus   . Hypercholesteremia   . Hypertension   . Kidney stone    Axis IV: problems related to social environment Axis V: 41-50 serious symptoms  Past Medical History:  Past Medical History  Diagnosis Date  . Schizophrenia   . Bipolar disorder   . Diabetes mellitus   . Hypercholesteremia   . Hypertension   . Kidney stone     Past Surgical History  Procedure Laterality Date  . Cholecystectomy    . Tubal ligation      Family History: History reviewed. No pertinent family history.  Social History:  reports that she has been smoking.  She does not have any smokeless tobacco history on file. She reports that she does not drink alcohol or use illicit drugs.  Additional Social History:     CIWA:  CIWA-Ar BP: 130/91 mmHg Pulse Rate: 94 COWS:    Allergies:  Allergies  Allergen Reactions  . Etomidate Other (See Comments)    Severe Myoclonus: SE  . Haldol (Haloperidol Decanoate)     Home Medications:  (Not in a hospital admission)  OB/GYN Status:  No LMP recorded. Patient is not currently having periods (Reason: Other).  General Assessment Data Location of Assessment: [facility] ACT Assessment: Yes Living Arrangements: Children Can pt return to current living arrangement?: Yes Admission Status: Involuntary Is patient capable of signing voluntary admission?: No Transfer from: Acute Hospital Referral Source: MD  Education Status Is patient currently in school?: No  Risk to self Suicidal Ideation: No Suicidal Intent: No Is patient at risk for suicide?: No Suicidal Plan?: No Access to Means: No What has been your use of drugs/alcohol within the last 12 months?: none Previous Attempts/Gestures: Yes How many times?:  (unknown) Other Self Harm Risks: none Triggers for Past Attempts: Hallucinations;Other (Comment) (delusions) Intentional Self Injurious Behavior: None Family Suicide History: No Recent stressful life event(s): Other (Comment) (denies) Persecutory voices/beliefs?: No Depression: Yes Depression Symptoms: Insomnia;Isolating Substance abuse history and/or treatment for substance abuse?: No Suicide prevention information given to non-admitted patients: Not applicable  Risk to Others Homicidal Ideation: No Thoughts of Harm to Others: No Current Homicidal Intent: No Current Homicidal Plan: No Access to Homicidal Means: No  History of harm to others?: No Assessment of Violence: In past 6-12 months Violent Behavior Description: patient can be agitated and combative when paranoid Does patient have access to weapons?: No Criminal Charges Pending?: No Does patient have a court date: No  Psychosis Hallucinations: None noted Delusions: Unspecified  (paranoi9d)  Mental Status Report Appear/Hygiene: Disheveled Eye Contact: Good Motor Activity: Freedom of movement;Restlessness Speech: Logical/coherent Level of Consciousness: Alert;Restless;Drowsy Mood: Depressed Affect: Anxious;Depressed Anxiety Level: Minimal Thought Processes: Coherent;Relevant Judgement: Unimpaired Orientation: Person;Place;Time Obsessive Compulsive Thoughts/Behaviors: None  Cognitive Functioning Concentration: Decreased Memory: Recent Intact;Remote Intact IQ: Average Insight: Good Impulse Control: Fair Appetite: Fair Weight Loss: 0 Weight Gain: 0 Sleep: No Change Vegetative Symptoms: None  ADLScreening [place]) Patient's cognitive ability adequate to safely complete daily activities?: Yes Patient able to express need for assistance with ADLs?: Yes Independently performs ADLs?: Yes (appropriate for developmental age)  Abuse/Neglect [facility]) Physical Abuse: Denies Verbal Abuse: Denies Sexual Abuse: Denies  Prior Inpatient Therapy Prior Inpatient Therapy: Yes Prior Therapy Dates: 2013 Prior Therapy Facilty/Provider(s): [facility] Reason for Treatment: psychosis  Prior Outpatient Therapy Prior Outpatient Therapy: Yes Prior Therapy Dates: 2013-2014 Prior Therapy Facilty/Provider(s): [facility]  Reason for Treatment: medicationsd  ADL Screening (condition at time of admission) Patient's cognitive ability adequate to safely complete daily activities?: Yes Patient able to express need for assistance with ADLs?: Yes Independently performs ADLs?: Yes (appropriate for developmental age)       Abuse/Neglect Assessment (Assessment to be complete while patient is alone) Physical Abuse: Denies Verbal Abuse: Denies Sexual Abuse: Denies Values / Beliefs Cultural Requests During Hospitalization: None Spiritual Requests During Hospitalization: None        Additional Information 1:1 In Past 12 Months?: No CIRT Risk: No Elopement  Risk: No Does patient have medical clearance?: Yes     Disposition: Patient to remain in the ED awaiting further evaluation by [facility]. Patient seen by [employer] supervisor of the [facility] act team. He agrees that the patient is at baseline and should be discharged home. Dr Lynelle Doctor given this information and will discharge patient./ Disposition Initial Assessment Completed for this Encounter: Yes Disposition of Patient: Other dispositions Other disposition(s): Other (Comment) (patient appears to be at baseline;[facility] to come and visit)  On Site Evaluation by:   Reviewed with Physician:     Jake Shark [facility] 06/03/2012 9:29 AM

## 2012-06-03 NOTE — ED Provider Notes (Signed)
1610 Patient is bipolar and schizophrenic with paranoid delusions. I have completed the IVC paperwork. She will be evaluated and placed by ACT later today. Holding orders are on the chart. Patient has slept very little tonight.   Nicoletta Dress. Colon Branch, MD 06/03/12 [phone]

## 2012-06-04 ENCOUNTER — Emergency Department (HOSPITAL_COMMUNITY)
Admission: EM | Admit: 2012-06-04 | Discharge: 2012-06-04 | Disposition: A | Discharge status: Home / Self Care | Payer: MEDICAID | Attending: Emergency Medicine | Admitting: Emergency Medicine

## 2012-06-04 ENCOUNTER — Encounter (HOSPITAL_COMMUNITY): Payer: Self-pay | Admitting: Emergency Medicine

## 2012-06-04 ENCOUNTER — Encounter (HOSPITAL_COMMUNITY): Payer: Self-pay | Admitting: *Deleted

## 2012-06-04 ENCOUNTER — Emergency Department (HOSPITAL_COMMUNITY)
Admission: EM | Admit: 2012-06-04 | Discharge: 2012-06-05 | Disposition: A | Discharge status: Home / Self Care | Payer: MEDICAID | Source: Home / Self Care | Attending: Emergency Medicine | Admitting: Emergency Medicine

## 2012-06-04 DIAGNOSIS — I10 Essential (primary) hypertension: Secondary | ICD-10-CM | POA: Insufficient documentation

## 2012-06-04 DIAGNOSIS — Z794 Long term (current) use of insulin: Secondary | ICD-10-CM | POA: Insufficient documentation

## 2012-06-04 DIAGNOSIS — F319 Bipolar disorder, unspecified: Secondary | ICD-10-CM | POA: Insufficient documentation

## 2012-06-04 DIAGNOSIS — E119 Type 2 diabetes mellitus without complications: Secondary | ICD-10-CM | POA: Insufficient documentation

## 2012-06-04 DIAGNOSIS — Z79899 Other long term (current) drug therapy: Secondary | ICD-10-CM | POA: Insufficient documentation

## 2012-06-04 DIAGNOSIS — E7800 Pure hypercholesterolemia, unspecified: Secondary | ICD-10-CM | POA: Insufficient documentation

## 2012-06-04 DIAGNOSIS — F209 Schizophrenia, unspecified: Secondary | ICD-10-CM | POA: Insufficient documentation

## 2012-06-04 DIAGNOSIS — Z87442 Personal history of urinary calculi: Secondary | ICD-10-CM | POA: Insufficient documentation

## 2012-06-04 DIAGNOSIS — F17200 Nicotine dependence, unspecified, uncomplicated: Secondary | ICD-10-CM | POA: Insufficient documentation

## 2012-06-04 DIAGNOSIS — F411 Generalized anxiety disorder: Secondary | ICD-10-CM | POA: Insufficient documentation

## 2012-06-04 DIAGNOSIS — F419 Anxiety disorder, unspecified: Secondary | ICD-10-CM

## 2012-06-04 NOTE — ED Notes (Addendum)
Pt presents back to ed today with personal from [facility] because she did not take her medications lat night,  Staff from [facility] personal report that they had received a phone call from pt's daughter because she was concerned that pt was agitated, pt alert, able to answer all questions, pt states that her nerves "got bad" yesterday but is feeling better today, denies any SI/HI. Denies any visual or auditory hallucinations. Staff from [facility] is concerned because they feel that pt is not at her baseline today.

## 2012-06-04 NOTE — ED Provider Notes (Signed)
History     [name]: [id number]  Arrival date & time 06/04/12  2252   First MD Initiated Contact with Patient 06/04/12 2312      Chief Complaint  Patient presents with  . Anxiety     Patient is a 42 y.o. female presenting with anxiety. The history is provided by the patient.  Anxiety This is a recurrent problem. Episode onset: today. The problem occurs constantly. The problem has not changed since onset.Pertinent negatives include no chest pain and no abdominal pain. Nothing aggravates the symptoms. Nothing relieves the symptoms.    Past Medical History  Diagnosis Date  . Schizophrenia   . Bipolar disorder   . Diabetes mellitus   . Hypercholesteremia   . Hypertension   . Kidney stone     Past Surgical History  Procedure Laterality Date  . Cholecystectomy    . Tubal ligation      History reviewed. No pertinent family history.  History  Substance Use Topics  . Smoking status: Current Every Day Smoker -- 1.00 packs/day  . Smokeless tobacco: Not on file  . Alcohol Use: No    OB History   Grav Para Term Preterm Abortions TAB SAB Ect Mult Living                  Review of Systems  Constitutional: Negative for fever.  Cardiovascular: Negative for chest pain.  Gastrointestinal: Negative for abdominal pain.  Psychiatric/Behavioral: Negative for suicidal ideas. The patient is nervous/anxious.     Allergies  Etomidate and Haldol  Home Medications   Current Outpatient Rx  Name  Route  Sig  Dispense  Refill  . albuterol (PROVENTIL HFA;VENTOLIN HFA) 108 (90 BASE) MCG/ACT inhaler   Inhalation   Inhale 2 puffs into the lungs 4 (four) times daily.         . benztropine (COGENTIN) 2 MG tablet   Oral   Take 2 mg by mouth 2 (two) times daily.         . busPIRone (BUSPAR) 10 MG tablet   Oral   Take 1 tablet (10 mg total) by mouth 3 (three) times daily.   90 tablet   0   . gabapentin (NEURONTIN) 300 MG capsule   Oral   Take 300 mg by mouth at bedtime.          . insulin aspart protamine-insulin aspart (NOVOLOG 70/30) (70-30) 100 UNIT/ML injection   Subcutaneous   Inject 40-60 Units into the skin 2 (two) times daily. 60 units in the AM and 40 units at night         . lisinopril (PRINIVIL,ZESTRIL) 40 MG tablet   Oral   Take 40 mg by mouth daily.         Marland Kitchen lithium 300 MG tablet   Oral   Take 300 mg by mouth 2 (two) times daily.         . metFORMIN (GLUCOPHAGE-XR) 500 MG 24 hr tablet   Oral   Take 1,000 mg by mouth 2 (two) times daily.         . metoprolol tartrate (LOPRESSOR) 25 MG tablet   Oral   Take 12.5 mg by mouth 2 (two) times daily.         Marland Kitchen OLANZapine (ZYPREXA) 10 MG tablet   Oral   Take 10 mg by mouth at bedtime.         . Paliperidone Palmitate (INVEGA SUSTENNA) 234 MG/1.5ML SUSP   Intramuscular   Inject 234  mg into the muscle every 28 (twenty-eight) days.          . simvastatin (ZOCOR) 20 MG tablet   Oral   Take 20 mg by mouth at bedtime.         . traZODone (DESYREL) 100 MG tablet   Oral   Take 100 mg by mouth at bedtime.           BP 139/83  Pulse 98  Temp(Src) 99.5 F (37.5 C) (Oral)  Resp 20  Ht 5\' 6"  (1.676 m)  Wt 238 lb (107.956 kg)  BMI 38.43 kg/m2  SpO2 100%  Physical Exam CONSTITUTIONAL: Well developed/well nourished HEAD: Normocephalic/atraumatic EYES: PERRL ENMT: Mucous membranes moist NECK: supple no meningeal signs CV: S1/S2 noted, no murmurs/rubs/gallops noted LUNGS: Lungs are clear to auscultation bilaterally, no apparent distress ABDOMEN: soft, nontender, no rebound or guarding NEURO: Pt is awake/alert, moves all extremitiesx4. Mild tremor noted but pt is ambulatory and no distress noted EXTREMITIES:full ROM SKIN: warm, color normal PSYCH: she is anxious, but no distress noted  ED Course  Procedures   11:48 PM Pt here for "nerves" and "not taking medicines" She denies current SI/HI She is not actively psychotic - she denies any hallucinations She answers  questions appropriately She reports she came today mostly because she felt anxious and has had issues with her family She was already seen on 06/04/12  And seen by ACT and has arrangements for f/u tomorrow morning at 9am Pt reports she is aware of this appt and plans to go   MDM  Nursing notes including past medical history and social history reviewed and considered in documentation Previous records reviewed and considered - ED visit from 06/04/12 reviewed Recent labs from ED visit on 06/02/12 reviewed        Joya Gaskins, MD 06/05/12 0000

## 2012-06-04 NOTE — ED Notes (Signed)
Pt was brought in by [facility] staff who reported that pt is "not at baseline". Pt was seen here yesterday and discharged. Pt states her daughter requested she come back for another evaluation but pt is unsure why she is here. Pt states she missed "2 doses" of medication last night but has taken her meds this morning. Pt denies any SI/HI ideations. Pt states "I feel fine".

## 2012-06-04 NOTE — ED Notes (Signed)
Patient states "my nerves are bad and I'm shaking and I haven't taken my medicine."

## 2012-06-04 NOTE — ED Provider Notes (Signed)
History     This chart was scribed for [name], MD, MD by [name], ED Scribe. The patient was seen in room APA15/APA15 and the patient's care was started at 11:58AM.   [name]: [id number]  Arrival date & time 06/04/12  1041      Chief Complaint  Patient presents with  . V70.1    The history is provided by the patient. No language interpreter was used.     Emily Cordova is a 42 y.o. female with h/o with schizophrenia, bipolar disorder, DM and HTN who presents to the Emergency Department for medical clearance today. Pt states that she feels fine and does not have any problems. Pt was discharged from the ED yesterday. She states that she has taken her medications as instructed. She states that she came here and sat in the waiting room during the night because she was nervous but symptoms subsided after talking to the security guard and she left without checking in to ED. She states she met her daughter while she was running errands today to pay her bills (states she was "On a journey") on the first of the month and her daughter said she was going to bring her back to ED. She states that sometimes she is worried what her daughters are doing "out in the streets."  And thinks they are up to "Devilment".Pt denies SI, HI, fever, chills, nausea, vomiting, diarrhea, weakness, cough, SOB and any other pain.  PCP Dr [name]  Past Medical History  Diagnosis Date  . Schizophrenia   . Bipolar disorder   . Diabetes mellitus   . Hypercholesteremia   . Hypertension   . Kidney stone     Past Surgical History  Procedure Laterality Date  . Cholecystectomy    . Tubal ligation      No family history on file.  History  Substance Use Topics  . Smoking status: Current Every Day Smoker -- 1.00 packs/day  . Smokeless tobacco: Not on file  . Alcohol Use: No  lives at home Lives with daughter  OB History   Grav Para Term Preterm Abortions TAB SAB Ect Mult Living                  Review  of Systems  Unable to perform ROS: Psychiatric disorder    Allergies  Etomidate and Haldol  Home Medications   Current Outpatient Rx  Name  Route  Sig  Dispense  Refill  . albuterol (PROVENTIL HFA;VENTOLIN HFA) 108 (90 BASE) MCG/ACT inhaler   Inhalation   Inhale 2 puffs into the lungs 4 (four) times daily.         . benztropine (COGENTIN) 2 MG tablet   Oral   Take 2 mg by mouth 2 (two) times daily.         . busPIRone (BUSPAR) 10 MG tablet   Oral   Take 1 tablet (10 mg total) by mouth 3 (three) times daily.   90 tablet   0   . gabapentin (NEURONTIN) 300 MG capsule   Oral   Take 300 mg by mouth at bedtime.         . insulin aspart protamine-insulin aspart (NOVOLOG 70/30) (70-30) 100 UNIT/ML injection   Subcutaneous   Inject 40-60 Units into the skin 2 (two) times daily. 60 units in the AM and 40 units at night         . lisinopril (PRINIVIL,ZESTRIL) 40 MG tablet   Oral   Take  40 mg by mouth daily.         Marland Kitchen lithium 300 MG tablet   Oral   Take 300 mg by mouth 2 (two) times daily.         . metFORMIN (GLUCOPHAGE-XR) 500 MG 24 hr tablet   Oral   Take 1,000 mg by mouth 2 (two) times daily.         . metoprolol tartrate (LOPRESSOR) 25 MG tablet   Oral   Take 12.5 mg by mouth 2 (two) times daily.         Marland Kitchen OLANZapine (ZYPREXA) 10 MG tablet   Oral   Take 10 mg by mouth at bedtime.         . Paliperidone Palmitate (INVEGA SUSTENNA) 234 MG/1.5ML SUSP   Intramuscular   Inject 234 mg into the muscle every 28 (twenty-eight) days.          . simvastatin (ZOCOR) 20 MG tablet   Oral   Take 20 mg by mouth at bedtime.         . traZODone (DESYREL) 100 MG tablet   Oral   Take 100 mg by mouth at bedtime.           BP 167/105  Pulse 112  Temp(Src) 98.1 F (36.7 C) (Oral)  Ht 5\' 4"  (1.626 m)  Wt 238 lb 6 oz (108.126 kg)  BMI 40.9 kg/m2  SpO2 99%  Vital signs normal except tachycardia   Physical Exam  Nursing note and vitals  reviewed. Constitutional: She is oriented to person, place, and time. She appears well-developed and well-nourished.  Non-toxic appearance. She does not appear ill. No distress.  HENT:  Head: Normocephalic and atraumatic.  Right Ear: External ear normal.  Left Ear: External ear normal.  Nose: Nose normal. No mucosal edema or rhinorrhea.  Mouth/Throat: Oropharynx is clear and moist and mucous membranes are normal. No dental abscesses or edematous.  Eyes: Conjunctivae and EOM are normal. Pupils are equal, round, and reactive to light.  Neck: Normal range of motion and full passive range of motion without pain. Neck supple.  Cardiovascular: Normal rate, regular rhythm and normal heart sounds.  Exam reveals no gallop and no friction rub.   No murmur heard. Pulmonary/Chest: Effort normal and breath sounds normal. No respiratory distress. She has no wheezes. She has no rhonchi. She has no rales. She exhibits no tenderness and no crepitus.  Abdominal: Soft. Normal appearance and bowel sounds are normal. She exhibits no distension. There is no tenderness. There is no rebound and no guarding.  Musculoskeletal: Normal range of motion. She exhibits no edema and no tenderness.  Moves all extremities well.   Neurological: She is alert and oriented to person, place, and time. She has normal strength. No cranial nerve deficit.  Skin: Skin is warm, dry and intact. No rash noted. No erythema. No pallor.  Psychiatric: She has a normal mood and affect. Her speech is normal and behavior is normal. Her mood appears not anxious.  When walking into room pt spoke to someone in the room saying, "She couldn't talk anymore because the doctor was here now"     ED Course  Procedures (including critical care time) DIAGNOSTIC STUDIES: Oxygen Saturation is 99% on room air, normal by my interpretation.     COORDINATION OF CARE: 12:03 PM Discussed ED treatment with pt and pt agrees.   Pt has been seen by Orvilla Fus, ACT who  agrees patient does not meet psychiatric admission criteria.  14:33 Dr Rosalia Hammers, patient's psychiatrist, discussed her current condition, agrees she doesn't need inpatient admission. He will see her in the morning at 9 am, her ACT team will pick her up.   Results for orders placed during the hospital encounter of 06/04/12  GLUCOSE, CAPILLARY      Result Value Range   Glucose-Capillary 313 (*) 70 - 99 mg/dL     1. Bipolar disorder     Plan discharge  Devoria Albe, MD, FACEP   MDM   I personally performed the services described in this documentation, which was scribed in my presence. The recorded information has been reviewed and considered.  Devoria Albe, MD, Armando Gang    [name], MD 06/04/12 [phone]

## 2012-06-04 NOTE — BHH Counselor (Signed)
Emily Cordova was in the ED most of the day. She has been requesting to leave. [name] Act Team felt she needed inpatient treatment. At this point the patient does not meet inpatient criteria. She is not suicidal nor homicidal. Threasa Heads denies hallucinations. She does not appear delusional. After talking with ACT supervisor Robinette Haines and explaining the discharge, the psychiatrist from [name] called and spoke with Dr Lynelle Doctor. It was decided between them that the patient did not meet criteria, that she would be discharged home, and that the ACT would transport her to an appointment with DR Geanie Cooley. ACT will pick her up at 8:00 am and transport her to day [facility] for an appointment  With Dr Geanie Cooley at 9:00 am on 06/05/2012.

## 2012-06-04 NOTE — ED Notes (Signed)
Pt has an appointment scheduled at [facility] with Dr. Hassie Bruce tomorrow morning. ACT team will come and pick up pt for her appointment. Pt is aware of appointment arrangement and verbally confirmed she will attend.

## 2012-06-05 ENCOUNTER — Emergency Department (HOSPITAL_COMMUNITY)
Admission: EM | Admit: 2012-06-05 | Discharge: 2012-06-13 | Disposition: A | Discharge status: Other Acute Inpatient Hospital | Payer: MEDICAID | Attending: Emergency Medicine | Admitting: Emergency Medicine

## 2012-06-05 ENCOUNTER — Encounter (HOSPITAL_COMMUNITY): Payer: Self-pay | Admitting: *Deleted

## 2012-06-05 DIAGNOSIS — F17200 Nicotine dependence, unspecified, uncomplicated: Secondary | ICD-10-CM | POA: Insufficient documentation

## 2012-06-05 DIAGNOSIS — F29 Unspecified psychosis not due to a substance or known physiological condition: Secondary | ICD-10-CM

## 2012-06-05 DIAGNOSIS — R443 Hallucinations, unspecified: Secondary | ICD-10-CM | POA: Insufficient documentation

## 2012-06-05 DIAGNOSIS — F209 Schizophrenia, unspecified: Secondary | ICD-10-CM | POA: Insufficient documentation

## 2012-06-05 DIAGNOSIS — E119 Type 2 diabetes mellitus without complications: Secondary | ICD-10-CM | POA: Insufficient documentation

## 2012-06-05 DIAGNOSIS — Z794 Long term (current) use of insulin: Secondary | ICD-10-CM | POA: Insufficient documentation

## 2012-06-05 DIAGNOSIS — F411 Generalized anxiety disorder: Secondary | ICD-10-CM | POA: Insufficient documentation

## 2012-06-05 DIAGNOSIS — Z79899 Other long term (current) drug therapy: Secondary | ICD-10-CM | POA: Insufficient documentation

## 2012-06-05 DIAGNOSIS — Z3202 Encounter for pregnancy test, result negative: Secondary | ICD-10-CM | POA: Insufficient documentation

## 2012-06-05 DIAGNOSIS — F319 Bipolar disorder, unspecified: Secondary | ICD-10-CM | POA: Insufficient documentation

## 2012-06-05 DIAGNOSIS — Z87442 Personal history of urinary calculi: Secondary | ICD-10-CM | POA: Insufficient documentation

## 2012-06-05 DIAGNOSIS — E7800 Pure hypercholesterolemia, unspecified: Secondary | ICD-10-CM | POA: Insufficient documentation

## 2012-06-05 DIAGNOSIS — I10 Essential (primary) hypertension: Secondary | ICD-10-CM | POA: Insufficient documentation

## 2012-06-05 LAB — CBC WITH DIFFERENTIAL/PLATELET
Basophils Absolute: 0 10*3/uL (ref 0.0–0.1)
Basophils Relative: 0 % (ref 0–1)
Eosinophils Absolute: 0.2 10*3/uL (ref 0.0–0.7)
Eosinophils Relative: 2 % (ref 0–5)
HCT: 39.8 % (ref 36.0–46.0)
Lymphocytes Relative: 28 % (ref 12–46)
MCH: 30.5 pg (ref 26.0–34.0)
MCHC: 34.4 g/dL (ref 30.0–36.0)
MCV: 88.6 fL (ref 78.0–100.0)
Monocytes Absolute: 0.6 10*3/uL (ref 0.1–1.0)
Platelets: 368 10*3/uL (ref 150–400)
RDW: 13.5 % (ref 11.5–15.5)

## 2012-06-05 LAB — COMPREHENSIVE METABOLIC PANEL
AST: 14 U/L (ref 0–37)
CO2: 25 mEq/L (ref 19–32)
Calcium: 10.1 mg/dL (ref 8.4–10.5)
Creatinine, Ser: 0.62 mg/dL (ref 0.50–1.10)
GFR calc non Af Amer: >90 mL/min (ref >90)
Total Protein: 8.3 g/dL (ref 6.0–8.3)

## 2012-06-05 LAB — LITHIUM LEVEL: Lithium Lvl: <0.25 mEq/L — ABNORMAL LOW (ref 0.80–1.40)

## 2012-06-05 LAB — GLUCOSE, CAPILLARY: Glucose-Capillary: 245 mg/dL — ABNORMAL HIGH (ref 70–99)

## 2012-06-05 LAB — RAPID URINE DRUG SCREEN, HOSP PERFORMED
Amphetamines: NOT DETECTED
Cocaine: NOT DETECTED
Opiates: NOT DETECTED

## 2012-06-05 LAB — URINALYSIS, ROUTINE W REFLEX MICROSCOPIC
Glucose, UA: NEGATIVE mg/dL
Hgb urine dipstick: NEGATIVE
Protein, ur: 30 mg/dL — AB
Specific Gravity, Urine: <1.005 — ABNORMAL LOW (ref 1.005–1.030)

## 2012-06-05 LAB — URINE MICROSCOPIC-ADD ON

## 2012-06-05 LAB — PREGNANCY, URINE: Preg Test, Ur: NEGATIVE

## 2012-06-05 MED ORDER — LITHIUM CARBONATE 300 MG PO CAPS
300.0000 mg | ORAL_CAPSULE | Freq: Two times a day (BID) | ORAL | Status: DC
  Administered 2012-06-06 – 2012-06-13 (×14): 300 mg via ORAL
  Filled 2012-06-05: qty 1
  Filled 2012-06-05: qty 2
  Filled 2012-06-05 (×3): qty 1
  Filled 2012-06-05 (×2): qty 2
  Filled 2012-06-05: qty 1
  Filled 2012-06-05 (×4): qty 2
  Filled 2012-06-05: qty 1
  Filled 2012-06-05: qty 2
  Filled 2012-06-05 (×4): qty 1
  Filled 2012-06-05 (×2): qty 2
  Filled 2012-06-05: qty 1
  Filled 2012-06-05: qty 2
  Filled 2012-06-05 (×2): qty 1
  Filled 2012-06-05 (×2): qty 2
  Filled 2012-06-05 (×5): qty 1

## 2012-06-05 MED ORDER — TRAZODONE HCL 50 MG PO TABS
ORAL_TABLET | ORAL | Status: AC
  Filled 2012-06-05: qty 2

## 2012-06-05 MED ORDER — BENZTROPINE MESYLATE 1 MG PO TABS
2.0000 mg | ORAL_TABLET | Freq: Two times a day (BID) | ORAL | Status: DC
  Administered 2012-06-05 – 2012-06-12 (×9): 2 mg via ORAL
  Filled 2012-06-05 (×6): qty 2

## 2012-06-05 MED ORDER — LITHIUM CARBONATE 300 MG PO TABS: 300.0000 mg | ORAL_TABLET | Freq: Two times a day (BID) | ORAL | Status: DC

## 2012-06-05 MED ORDER — INSULIN ASPART PROT & ASPART (70-30 MIX) 100 UNIT/ML ~~LOC~~ SUSP
40.0000 [IU] | Freq: Every day | SUBCUTANEOUS | Status: DC
  Administered 2012-06-06 – 2012-06-13 (×5): 40 [IU] via SUBCUTANEOUS
  Filled 2012-06-05: qty 10

## 2012-06-05 MED ORDER — METFORMIN HCL ER 500 MG PO TB24
1000.0000 mg | ORAL_TABLET | Freq: Two times a day (BID) | ORAL | Status: DC
  Administered 2012-06-05: 1000 mg via ORAL

## 2012-06-05 MED ORDER — LITHIUM CARBONATE 300 MG PO CAPS
ORAL_CAPSULE | ORAL | Status: AC
  Filled 2012-06-05: qty 1

## 2012-06-05 MED ORDER — BUSPIRONE HCL 5 MG PO TABS
ORAL_TABLET | ORAL | Status: AC
  Filled 2012-06-05: qty 2

## 2012-06-05 MED ORDER — LORAZEPAM 2 MG/ML IJ SOLN
2.0000 mg | Freq: Once | INTRAMUSCULAR | Status: AC
  Administered 2012-06-05: 2 mg via INTRAMUSCULAR
  Filled 2012-06-05: qty 1

## 2012-06-05 MED ORDER — TRAZODONE HCL 50 MG PO TABS
100.0000 mg | ORAL_TABLET | Freq: Every day | ORAL | Status: DC
  Administered 2012-06-05 – 2012-06-12 (×8): 100 mg via ORAL
  Filled 2012-06-05 (×13): qty 2

## 2012-06-05 MED ORDER — GABAPENTIN 300 MG PO CAPS
ORAL_CAPSULE | ORAL | Status: AC
  Filled 2012-06-05: qty 1

## 2012-06-05 MED ORDER — SIMVASTATIN 20 MG PO TABS
20.0000 mg | ORAL_TABLET | Freq: Every day | ORAL | Status: DC
  Administered 2012-06-06 – 2012-06-12 (×7): 20 mg via ORAL
  Filled 2012-06-05 (×13): qty 1

## 2012-06-05 MED ORDER — INSULIN ASPART PROT & ASPART (70-30 MIX) 100 UNIT/ML ~~LOC~~ SUSP: 40.0000 [IU] | Freq: Two times a day (BID) | SUBCUTANEOUS | Status: DC

## 2012-06-05 MED ORDER — IBUPROFEN 400 MG PO TABS: 600.0000 mg | ORAL_TABLET | Freq: Three times a day (TID) | ORAL | Status: DC | PRN

## 2012-06-05 MED ORDER — LISINOPRIL 10 MG PO TABS
40.0000 mg | ORAL_TABLET | Freq: Every day | ORAL | Status: DC
  Administered 2012-06-06 – 2012-06-12 (×5): 40 mg via ORAL
  Filled 2012-06-05 (×3): qty 4

## 2012-06-05 MED ORDER — INSULIN ASPART 100 UNIT/ML ~~LOC~~ SOLN
0.0000 [IU] | Freq: Three times a day (TID) | SUBCUTANEOUS | Status: DC
  Administered 2012-06-06: 1 [IU] via SUBCUTANEOUS
  Administered 2012-06-07 (×2): 2 [IU] via SUBCUTANEOUS
  Administered 2012-06-08: 1 [IU] via SUBCUTANEOUS
  Administered 2012-06-09 – 2012-06-10 (×2): 2 [IU] via SUBCUTANEOUS
  Administered 2012-06-12: 1 [IU] via SUBCUTANEOUS
  Administered 2012-06-13: 2 [IU] via SUBCUTANEOUS
  Filled 2012-06-05 (×2): qty 1

## 2012-06-05 MED ORDER — METFORMIN HCL ER 500 MG PO TB24
1000.0000 mg | ORAL_TABLET | Freq: Two times a day (BID) | ORAL | Status: DC
  Administered 2012-06-06 – 2012-06-13 (×15): 1000 mg via ORAL
  Filled 2012-06-05 (×28): qty 2

## 2012-06-05 MED ORDER — OLANZAPINE 5 MG PO TABS
ORAL_TABLET | ORAL | Status: AC
  Filled 2012-06-05: qty 2

## 2012-06-05 MED ORDER — ONDANSETRON HCL 4 MG PO TABS: 4.0000 mg | ORAL_TABLET | Freq: Three times a day (TID) | ORAL | Status: DC | PRN

## 2012-06-05 MED ORDER — BUSPIRONE HCL 5 MG PO TABS
10.0000 mg | ORAL_TABLET | Freq: Three times a day (TID) | ORAL | Status: DC
  Administered 2012-06-05 – 2012-06-13 (×23): 10 mg via ORAL
  Filled 2012-06-05 (×20): qty 2
  Filled 2012-06-05: qty 1
  Filled 2012-06-05: qty 2
  Filled 2012-06-05: qty 1
  Filled 2012-06-05: qty 2
  Filled 2012-06-05: qty 1
  Filled 2012-06-05 (×9): qty 2
  Filled 2012-06-05: qty 1
  Filled 2012-06-05 (×5): qty 2

## 2012-06-05 MED ORDER — OLANZAPINE 5 MG PO TABS
10.0000 mg | ORAL_TABLET | Freq: Every day | ORAL | Status: DC
  Administered 2012-06-05 – 2012-06-12 (×8): 10 mg via ORAL
  Filled 2012-06-05 (×5): qty 2
  Filled 2012-06-05: qty 1
  Filled 2012-06-05 (×6): qty 2

## 2012-06-05 MED ORDER — INSULIN ASPART PROT & ASPART (70-30 MIX) 100 UNIT/ML ~~LOC~~ SUSP
60.0000 [IU] | Freq: Every day | SUBCUTANEOUS | Status: DC
  Administered 2012-06-06 – 2012-06-10 (×3): 60 [IU] via SUBCUTANEOUS
  Filled 2012-06-05 (×3): qty 10

## 2012-06-05 MED ORDER — GABAPENTIN 300 MG PO CAPS
300.0000 mg | ORAL_CAPSULE | Freq: Every day | ORAL | Status: DC
  Administered 2012-06-05 – 2012-06-12 (×8): 300 mg via ORAL
  Filled 2012-06-05 (×13): qty 1

## 2012-06-05 MED ORDER — NICOTINE 21 MG/24HR TD PT24
21.0000 mg | MEDICATED_PATCH | Freq: Every day | TRANSDERMAL | Status: DC
  Filled 2012-06-05 (×3): qty 1

## 2012-06-05 MED ORDER — ALBUTEROL SULFATE HFA 108 (90 BASE) MCG/ACT IN AERS
2.0000 | INHALATION_SPRAY | Freq: Four times a day (QID) | RESPIRATORY_TRACT | Status: DC
  Administered 2012-06-05 – 2012-06-06 (×5): 2 via RESPIRATORY_TRACT
  Filled 2012-06-05 (×2): qty 6.7

## 2012-06-05 MED ORDER — METOPROLOL TARTRATE 25 MG PO TABS
12.5000 mg | ORAL_TABLET | Freq: Two times a day (BID) | ORAL | Status: DC
  Administered 2012-06-05 – 2012-06-12 (×8): 12.5 mg via ORAL
  Filled 2012-06-05 (×5): qty 1

## 2012-06-05 NOTE — Progress Notes (Signed)
Spoke with [facility] Actt Team Manager, Robinette Haines at [phone] who reported pt was at [facility] today and got upset for some reason and refused her haldol and ativan shot. They petitioned on her and brought her to the ed.  Jerilynn Som will be here at 1030am to reevaluate to see if pt can go home as their job is to try to deter inpatient treatment if necessary.  Dr Ranciour reports he did recommend a telepsych and the recommendation was to admit but suggest we wait for the reevaluation of the Actt  Team to see if pt can be released.  If not placement will be the other alternative.

## 2012-06-05 NOTE — ED Notes (Signed)
Standing at entrance to room, in handcuffs with sheriffs deputy at side. Shaking hands. Allowed injection of ativan, which was given w/o incident

## 2012-06-05 NOTE — ED Notes (Signed)
Went into pt's room to discharge, pt not in room, witnessed walking out five minutes prior by another nurse.

## 2012-06-05 NOTE — ED Notes (Signed)
Pt with IVC papers on pt stating that she is a threat to self and others, threatening the staff at [facility], pt denies seeing things or hearing voices, denies SI/ HI

## 2012-06-05 NOTE — ED Notes (Signed)
[employer] asked to be signed off on pt and I advised them at this time they could not be signed off, due to her being here the last three days and the way she acts when she is awake. She constantly standing at the door of her room pacing her feet back and forth.

## 2012-06-05 NOTE — ED Notes (Signed)
ACT called stated and stated patient is suppose to have her own ACT member with her during hospital visit. RN made aware.

## 2012-06-05 NOTE — ED Provider Notes (Signed)
History  This chart was scribed for Glynn Octave, MD by Shari Heritage, ED Scribe. The patient was seen in room APA18/APA18. Patient's care was started at 1800.   [name]: [id number]  Arrival date & time 06/05/12  1743   First MD Initiated Contact with Patient 06/05/12 1800      Chief Complaint  Patient presents with  . V70.1     The history is provided by the police and the patient. No language interpreter was used.    HPI Comments: Emily Cordova is a 42 y.o. female with history of anxiety, schizophrenia, bipolar disorder, diabetes, hypertension  who presents to the Emergency Department from [facility] Department with increased agitation and behavioral problems. She admits to auditory hallucinations Patient denies SI or HI.   Per IVC report, patient is currently paranoid and stating people are trying to fight her. She also complained that her granddaughter is being held hostage in the [facility] building where she was being tortured and murdered. Patient reportedly began threatening staff and refused to leave [facility] without her granddaughter. Patient was regarded as a threat to herself and others. She refused any medication and has been noncompliant with her home medicines. Patient is exhibiting escalation of behaviors.  Past Medical History  Diagnosis Date  . Schizophrenia   . Bipolar disorder   . Diabetes mellitus   . Hypercholesteremia   . Hypertension   . Kidney stone     Past Surgical History  Procedure Laterality Date  . Cholecystectomy    . Tubal ligation      History reviewed. No pertinent family history.  History  Substance Use Topics  . Smoking status: Current Every Day Smoker -- 1.00 packs/day  . Smokeless tobacco: Not on file  . Alcohol Use: No    OB History   Grav Para Term Preterm Abortions TAB SAB Ect Mult Living                  Review of Systems A complete 10 system review of systems was obtained and all  systems are negative except as noted in the HPI and PMH. . Allergies  Etomidate and Haldol  Home Medications   Current Outpatient Rx  Name  Route  Sig  Dispense  Refill  . albuterol (PROVENTIL HFA;VENTOLIN HFA) 108 (90 BASE) MCG/ACT inhaler   Inhalation   Inhale 2 puffs into the lungs 4 (four) times daily.         . Aspirin-Salicylamide-Caffeine (BC HEADACHE) 325-95-16 MG TABS   Oral   Take 1 packet by mouth daily as needed (for headache pain).         . benztropine (COGENTIN) 2 MG tablet   Oral   Take 2 mg by mouth 2 (two) times daily.         . busPIRone (BUSPAR) 10 MG tablet   Oral   Take 1 tablet (10 mg total) by mouth 3 (three) times daily.   90 tablet   0   . gabapentin (NEURONTIN) 300 MG capsule   Oral   Take 300 mg by mouth at bedtime.         . insulin aspart protamine-insulin aspart (NOVOLOG 70/30) (70-30) 100 UNIT/ML injection   Subcutaneous   Inject 40-60 Units into the skin 2 (two) times daily. 60 units in the AM and 40 units at night         . lisinopril (PRINIVIL,ZESTRIL) 40 MG tablet   Oral   Take 40  mg by mouth daily.         Marland Kitchen lithium 300 MG tablet   Oral   Take 300 mg by mouth 2 (two) times daily.         . metFORMIN (GLUCOPHAGE-XR) 500 MG 24 hr tablet   Oral   Take 1,000 mg by mouth 2 (two) times daily.         . metoprolol tartrate (LOPRESSOR) 25 MG tablet   Oral   Take 12.5 mg by mouth 2 (two) times daily.         Marland Kitchen OLANZapine (ZYPREXA) 10 MG tablet   Oral   Take 10 mg by mouth at bedtime.         . Paliperidone Palmitate (INVEGA SUSTENNA) 234 MG/1.5ML SUSP   Intramuscular   Inject 234 mg into the muscle every 28 (twenty-eight) days.          . simvastatin (ZOCOR) 20 MG tablet   Oral   Take 20 mg by mouth at bedtime.         . traZODone (DESYREL) 100 MG tablet   Oral   Take 100 mg by mouth at bedtime.           Triage Vitals: BP 156/110  Pulse 100  Temp(Src) 99.2 F (37.3 C) (Oral)  Resp 18  SpO2  97%  Physical Exam  Constitutional: She appears well-developed and well-nourished.  HENT:  Head: Normocephalic and atraumatic.  Eyes: Conjunctivae and EOM are normal. Pupils are equal, round, and reactive to light.  Neck: Normal range of motion. Neck supple.  Cardiovascular: Normal rate, regular rhythm and normal heart sounds.   Pulmonary/Chest: Effort normal and breath sounds normal.  Abdominal: Soft. Bowel sounds are normal. She exhibits no distension. There is no tenderness.  Musculoskeletal: Normal range of motion.  Neurological: She is alert.  Resting tremor of upper extremities. Moving all extremities. No deficits. No clonus.  Skin: Skin is warm and dry.  Psychiatric:  Tangential thoughts. Perseveration on granddaughter.    ED Course  Procedures (including critical care time) DIAGNOSTIC STUDIES: Oxygen Saturation is 97% on room air, adequate by my interpretation.    COORDINATION OF CARE: 6:02 PM- Patient informed of current plan for treatment and evaluation and agrees with plan at this time.    Labs Reviewed  COMPREHENSIVE METABOLIC PANEL - Abnormal; Notable for the following:    Potassium 3.2 (*)    Glucose, Bld 201 (*)    Alkaline Phosphatase 127 (*)    All other components within normal limits  LITHIUM LEVEL - Abnormal; Notable for the following:    Lithium Lvl <0.25 (*)    All other components within normal limits  URINALYSIS, ROUTINE W REFLEX MICROSCOPIC - Abnormal; Notable for the following:    Specific Gravity, Urine <1.005 (*)    Protein, ur 30 (*)    All other components within normal limits  GLUCOSE, CAPILLARY - Abnormal; Notable for the following:    Glucose-Capillary 245 (*)    All other components within normal limits  CBC WITH DIFFERENTIAL  ETHANOL  URINE RAPID DRUG SCREEN (HOSP PERFORMED)  PREGNANCY, URINE  URINE MICROSCOPIC-ADD ON    No results found.   No diagnosis found.    MDM  Patient with history of schizophrenia and bipolar  disorder presenting from [facility] with IVC paperwork. She was hallucinating that her grandchild is being held hostage there. She was discharged from the ER this morning. States compliance with her medications, denies SI or HI.  Discussed with act team. Plan is for [facility] staff to reevaluate her in the morning since they are familiar with her. Psychiatric consult recommends psychiatric admission.  Lab studies appear to be at baseline. Patient is medically clear for psychiatric evaluation.   I personally performed the services described in this documentation, which was scribed in my presence. The recorded information has been reviewed and is accurate.    Glynn Octave, MD 06/06/12 [phone]

## 2012-06-05 NOTE — ED Notes (Signed)
Discharge instructions reviewed with pt, questions answered. Pt verbalized understanding.  

## 2012-06-05 NOTE — ED Notes (Signed)
Pt refusing nicotine patch at this time

## 2012-06-05 NOTE — ED Notes (Signed)
Gave patient meal tray at patient request. Security at bedside. No other needs voiced at this time.

## 2012-06-05 NOTE — ED Notes (Signed)
Patient unable to collect urine sample at this time.

## 2012-06-06 LAB — GLUCOSE, CAPILLARY
Glucose-Capillary: 134 mg/dL — ABNORMAL HIGH (ref 70–99)
Glucose-Capillary: 63 mg/dL — ABNORMAL LOW (ref 70–99)
Glucose-Capillary: 113 mg/dL — ABNORMAL HIGH (ref 70–99)

## 2012-06-06 MED ORDER — LOPERAMIDE HCL 2 MG PO CAPS: 2.0000 mg | ORAL_CAPSULE | Freq: Four times a day (QID) | ORAL | Status: DC | PRN

## 2012-06-06 MED ORDER — LOPERAMIDE HCL 2 MG PO CAPS
4.0000 mg | ORAL_CAPSULE | Freq: Once | ORAL | Status: AC
  Administered 2012-06-06: 4 mg via ORAL
  Filled 2012-06-06: qty 2

## 2012-06-06 NOTE — ED Notes (Signed)
Report received from transferring RN

## 2012-06-06 NOTE — ED Provider Notes (Signed)
The patient is awake alert nontoxic in appearance this year as an IVC denies chest pain shortness breath abdominal pain or other concerns, wants discharge but refuses to answer questions when I ask her about suicidal or homicidal ideation or hallucinations. 2030 Pt is on wait list at [facility] 2345  Hurman Horn, MD 06/10/12 [phone]

## 2012-06-06 NOTE — ED Notes (Signed)
Pt had 3rd episode of loose stool.  edp notified and orders received.

## 2012-06-06 NOTE — ED Notes (Signed)
Pt up to bathroom with deputy with 2nd loose stool.  nad noted.

## 2012-06-06 NOTE — ED Notes (Signed)
Given OJ with dinner tray for CBG.  nad noted.  [employer] at bedside.

## 2012-06-06 NOTE — ED Provider Notes (Signed)
06/06/12 Patient was taken to [facility] where she decompensated and became paranoid again.Sheriff was called and patient was placed in hand cuffs and brought back to the ER. She was seen by Samson Frederic who states that [facility] would see her in the AM.   0600 Patient is resting on the bed. Hand cuffs remain on. No c/o.  Nicoletta Dress. Colon Branch, MD 06/06/12 [phone]

## 2012-06-06 NOTE — ED Notes (Signed)
Pt up to bathroom with deputy.  Cooperative.  Pt still delusional, questioning where her granddaughter is.  Reporting her granddaughter was kidnapped yesterday by someone in a white van.  Comfort measures given.  Informed pt that granddaughter was safe.  Pt requesting "real police" states deputy at bedside was "crooked."  nad noted at this time. Pt ate 75% of breakfast tray.

## 2012-06-06 NOTE — ED Notes (Signed)
Pt up to bathroom with minimal assistance.  Pt had large loose BM.  nad noted.  [employer] at bedside.

## 2012-06-07 LAB — GLUCOSE, CAPILLARY
Glucose-Capillary: 87 mg/dL (ref 70–99)
Glucose-Capillary: 155 mg/dL — ABNORMAL HIGH (ref 70–99)

## 2012-06-07 MED ORDER — ALBUTEROL SULFATE HFA 108 (90 BASE) MCG/ACT IN AERS: 2.0000 | INHALATION_SPRAY | RESPIRATORY_TRACT | Status: DC | PRN

## 2012-06-07 MED ORDER — METFORMIN HCL 500 MG PO TABS
ORAL_TABLET | ORAL | Status: AC
  Filled 2012-06-07: qty 2

## 2012-06-07 MED ORDER — BENZTROPINE MESYLATE 1 MG PO TABS
ORAL_TABLET | ORAL | Status: AC
  Administered 2012-06-07: 2 mg via ORAL
  Filled 2012-06-07: qty 2

## 2012-06-07 MED ORDER — METOPROLOL TARTRATE 25 MG PO TABS
ORAL_TABLET | ORAL | Status: AC
  Administered 2012-06-07: 12.5 mg via ORAL
  Filled 2012-06-07: qty 1

## 2012-06-07 NOTE — ED Notes (Signed)
Pt is setting up at bedside at this time eating her lunch tray. Emily Cordova

## 2012-06-07 NOTE — Progress Notes (Signed)
Spoke with nurse Okey Regal at [facility] who confirmed pt is accepted to the wait [phone].  Faxed packet of IVC paperwork with [facility] included. Fax [phone].  Dr Preston Fleeting informed of status. [facility] Referral Request and labs  were faxed by Robinette Haines from [facility].  [phone].

## 2012-06-07 NOTE — ED Notes (Signed)
Introduced self to pt at this time pt stated she didn't need any thing at this time she stated she is leaving today rn aware of same. Deanda Ruddell

## 2012-06-07 NOTE — ED Notes (Signed)
[employer] officer at bedside.  Cuff to wrist changed to right arm.

## 2012-06-07 NOTE — ED Notes (Signed)
Patient making negative comments toward staff, name calling, states "you better watch your back" at times sounds as if threatening this staff member, then comments someone will come in here and get all of you?  Hallucinating at times, responding to voices only she hears

## 2012-06-07 NOTE — ED Notes (Signed)
Notified earlier by Jerilynn Som via Armed forces technical officer that patient is on the wait list for [facility]

## 2012-06-08 LAB — GLUCOSE, CAPILLARY
Glucose-Capillary: 73 mg/dL (ref 70–99)
Glucose-Capillary: 137 mg/dL — ABNORMAL HIGH (ref 70–99)

## 2012-06-08 MED ORDER — BENZTROPINE MESYLATE 1 MG PO TABS
ORAL_TABLET | ORAL | Status: AC
  Filled 2012-06-08: qty 2

## 2012-06-08 MED ORDER — METOPROLOL TARTRATE 25 MG PO TABS
ORAL_TABLET | ORAL | Status: AC
  Filled 2012-06-08: qty 1

## 2012-06-08 MED ORDER — BENZTROPINE MESYLATE 1 MG PO TABS
ORAL_TABLET | ORAL | Status: AC
  Administered 2012-06-08: 2 mg via ORAL
  Filled 2012-06-08: qty 2

## 2012-06-08 MED ORDER — METOPROLOL TARTRATE 25 MG PO TABS
ORAL_TABLET | ORAL | Status: AC
  Administered 2012-06-08: 12.5 mg via ORAL
  Filled 2012-06-08: qty 1

## 2012-06-08 MED ORDER — LORAZEPAM 1 MG PO TABS
1.0000 mg | ORAL_TABLET | Freq: Four times a day (QID) | ORAL | Status: DC | PRN
  Administered 2012-06-08: 1 mg via ORAL
  Filled 2012-06-08: qty 1

## 2012-06-08 MED ORDER — LISINOPRIL 10 MG PO TABS
ORAL_TABLET | ORAL | Status: AC
  Administered 2012-06-08: 40 mg via ORAL
  Filled 2012-06-08: qty 4

## 2012-06-08 NOTE — ED Notes (Signed)
Patient given a Sprite to drink.

## 2012-06-08 NOTE — ED Notes (Signed)
Pt was given a meal tray and a coke to bring her sugar up.

## 2012-06-08 NOTE — ED Notes (Signed)
Strong body odor.  Have offered for patient to shower 3 x this AM, which has been declined.  Told her she would shower at 0945.

## 2012-06-08 NOTE — ED Notes (Signed)
Patient is resting comfortably. 

## 2012-06-08 NOTE — ED Notes (Signed)
Patient performed bed bath. Full linen change done. Sheriff at bedside.

## 2012-06-08 NOTE — ED Notes (Signed)
Sheriff's dept. Officer at bedside.  Patient cuffed to bed R arm when not eating.

## 2012-06-08 NOTE — Progress Notes (Signed)
7:50 AM Pt awake and alert. No complaints.  Ate her breakfast.  She is waiting for a bed at [facility].

## 2012-06-08 NOTE — ED Notes (Signed)
Patient is pleasant, converses easily.  Officers at bedside.

## 2012-06-09 LAB — GLUCOSE, CAPILLARY
Glucose-Capillary: 101 mg/dL — ABNORMAL HIGH (ref 70–99)
Glucose-Capillary: 105 mg/dL — ABNORMAL HIGH (ref 70–99)
Glucose-Capillary: 112 mg/dL — ABNORMAL HIGH (ref 70–99)
Glucose-Capillary: 186 mg/dL — ABNORMAL HIGH (ref 70–99)

## 2012-06-09 MED ORDER — BENZTROPINE MESYLATE 1 MG PO TABS
ORAL_TABLET | ORAL | Status: AC
  Filled 2012-06-09: qty 2

## 2012-06-09 MED ORDER — NICOTINE 21 MG/24HR TD PT24
MEDICATED_PATCH | TRANSDERMAL | Status: AC
  Filled 2012-06-09: qty 1

## 2012-06-09 MED ORDER — LISINOPRIL 10 MG PO TABS
ORAL_TABLET | ORAL | Status: AC
  Filled 2012-06-09: qty 4

## 2012-06-09 MED ORDER — METOPROLOL TARTRATE 25 MG PO TABS
ORAL_TABLET | ORAL | Status: AC
  Filled 2012-06-09: qty 1

## 2012-06-09 MED ORDER — METFORMIN HCL 500 MG PO TABS
ORAL_TABLET | ORAL | Status: AC
  Filled 2012-06-09: qty 2

## 2012-06-09 MED ORDER — ONDANSETRON 8 MG PO TBDP: 8.0000 mg | ORAL_TABLET | Freq: Once | ORAL | Status: DC

## 2012-06-09 NOTE — ED Notes (Signed)
Blood glucose checked 112.

## 2012-06-09 NOTE — ED Notes (Signed)
Pt cont. To sleep, easily aroused w/ verbal stimuli, asked for a bag of chips. Potato chips left at bedside for pt. Given cup of diet coke, she drank several sips. Stated she felt fine and just wanted to sleep. Officer remains at bedside.

## 2012-06-09 NOTE — ED Notes (Signed)
Pt denied all medications. Three other nurses Joselyn Glassman, [place], and [place]) also tried to get her to take her medications.

## 2012-06-09 NOTE — ED Notes (Signed)
Pt refuses CBG test.

## 2012-06-09 NOTE — ED Notes (Signed)
Pt ambulated to the restroom with deputy.

## 2012-06-09 NOTE — ED Notes (Addendum)
Pt asleep in bed, normal rise and fall of chest noted.  

## 2012-06-09 NOTE — ED Notes (Signed)
Pt aroused form sleep for am meal, pt refused blood sugar check, but with explanation agreed for staff to check it, 105, am insulin held.  Meal tray left in exam room. Pt went back to sleep. [employer] at bedside as sitter for pt.

## 2012-06-09 NOTE — ED Notes (Signed)
Pt sitting in bed with no complaints.

## 2012-06-10 LAB — GLUCOSE, CAPILLARY: Glucose-Capillary: 156 mg/dL — ABNORMAL HIGH (ref 70–99)

## 2012-06-10 MED ORDER — METOPROLOL TARTRATE 25 MG PO TABS
ORAL_TABLET | ORAL | Status: AC
  Filled 2012-06-10: qty 1

## 2012-06-10 MED ORDER — LISINOPRIL 10 MG PO TABS
ORAL_TABLET | ORAL | Status: AC
  Filled 2012-06-10: qty 4

## 2012-06-10 MED ORDER — BENZTROPINE MESYLATE 1 MG PO TABS
ORAL_TABLET | ORAL | Status: AC
  Filled 2012-06-10: qty 2

## 2012-06-10 NOTE — ED Provider Notes (Signed)
1610 Patient has been calm all night. She does not appear to be responding to outside stimulation. She continues to wait for a bed at [facility]. She did not take medications yesterday.   B/P 154/90  HR 81  T 98.4  O2 sat 100% HEENT: Flat affect, otherwise normal Cor RRR Chest: normal breath sounds, no rales, rhonchi, wheezes Abd: normal bowel sounds Ext: no edema  Nicoletta Dress. Colon Branch, MD 06/10/12 [phone]

## 2012-06-10 NOTE — ED Notes (Signed)
Emily Cordova with [facility] called to make sure she still needed placement.

## 2012-06-10 NOTE — ED Notes (Signed)
Pt up eating lunch tray.

## 2012-06-10 NOTE — BH Assessment (Signed)
Assessment Note   Emily Cordova is an 42 y.o. female. The patient remains paranoid. At times she refuses all medications. At times she refuses to eat or drink. She thinks her grandchildren remain at [name] and they are being held there. She has mentioned that they are tortured and being murdered. Today she feigns being unresponsive. She will however stop her arm when it is dropped over her face.  Axis I:  Bipolar Disorder with psychosis Axis II: Deferred Axis III:  Past Medical History  Diagnosis Date  . Schizophrenia   . Bipolar disorder   . Diabetes mellitus   . Hypercholesteremia   . Hypertension   . Kidney stone    Axis IV: other psychosocial or environmental problems, problems related to social environment and problems with primary support group Axis V: 21-30 behavior considerably influenced by delusions or hallucinations OR serious impairment in judgment, communication OR inability to function in almost all areas  Past Medical History:  Past Medical History  Diagnosis Date  . Schizophrenia   . Bipolar disorder   . Diabetes mellitus   . Hypercholesteremia   . Hypertension   . Kidney stone     Past Surgical History  Procedure Laterality Date  . Cholecystectomy    . Tubal ligation      Family History: History reviewed. No pertinent family history.  Social History:  reports that she has been smoking.  She does not have any smokeless tobacco history on file. She reports that she does not drink alcohol or use illicit drugs.  Additional Social History:     CIWA: CIWA-Ar BP: 154/90 mmHg Pulse Rate: 81 COWS:    Allergies:  Allergies  Allergen Reactions  . Etomidate Other (See Comments)    Severe Myoclonus: SE  . Haldol (Haloperidol Decanoate)     Home Medications:  (Not in a hospital admission)  OB/GYN Status:  No LMP recorded. Patient is not currently having periods (Reason: Other).  General Assessment Data Location of Assessment: [facility] ACT  Assessment: Yes Living Arrangements: Children Can pt return to current living arrangement?: Yes Admission Status: Involuntary Is patient capable of signing voluntary admission?: No Transfer from: Acute Hospital Referral Source: MD     Risk to self Suicidal Ideation: No Suicidal Intent: No Is patient at risk for suicide?: No Suicidal Plan?: No Access to Means: No What has been your use of drugs/alcohol within the last 12 months?: none Previous Attempts/Gestures: Yes How many times?:  (unknown) Other Self Harm Risks: none Triggers for Past Attempts: Hallucinations;Other (Comment) Intentional Self Injurious Behavior: None Family Suicide History: No Recent stressful life event(s): Other (Comment) (denies) Persecutory voices/beliefs?: No Depression: Yes Depression Symptoms: Insomnia;Isolating;Feeling angry/irritable Substance abuse history and/or treatment for substance abuse?: No Suicide prevention information given to non-admitted patients: Not applicable  Risk to Others Homicidal Ideation: No Thoughts of Harm to Others: No Current Homicidal Intent: No Current Homicidal Plan: No Access to Homicidal Means: No History of harm to others?: No Assessment of Violence: In past 6-12 months Violent Behavior Description: can be agitated and combative when delusional Does patient have access to weapons?: No Criminal Charges Pending?: No Does patient have a court date: No  Psychosis Hallucinations: None noted Delusions: Unspecified (paranoid)  Mental Status Report Appear/Hygiene: Disheveled Eye Contact: Poor Motor Activity: Agitation;Restlessness Speech: Logical/coherent Level of Consciousness: Restless;Drowsy;Sleeping Mood: Labile;Irritable Affect: Anxious;Depressed Anxiety Level: Minimal Thought Processes: Coherent;Relevant Judgement: Impaired Orientation: Person;Place;Time Obsessive Compulsive Thoughts/Behaviors: Moderate  Cognitive Functioning Concentration:  Decreased Memory: Recent Intact;Remote  Intact IQ: Average Insight: Poor Impulse Control: Poor Appetite: Fair Sleep: No Change Vegetative Symptoms: Decreased grooming  ADLScreening [facility] Assessment Services) Patient's cognitive ability adequate to safely complete daily activities?: Yes Patient able to express need for assistance with ADLs?: Yes Independently performs ADLs?: Yes (appropriate for developmental age)  Abuse/Neglect [facility]) Physical Abuse: Denies Verbal Abuse: Denies Sexual Abuse: Denies  Prior Inpatient Therapy Prior Inpatient Therapy: Yes Prior Therapy Dates: 2013 Prior Therapy Facilty/Provider(s): [facility] Reason for Treatment: psychosis  Prior Outpatient Therapy Prior Outpatient Therapy: Yes Prior Therapy Dates: 2013-2014 Prior Therapy Facilty/Provider(s): [facility]  Reason for Treatment: medicationsd  ADL Screening (condition at time of admission) Patient's cognitive ability adequate to safely complete daily activities?: Yes Patient able to express need for assistance with ADLs?: Yes Independently performs ADLs?: Yes (appropriate for developmental age)       Abuse/Neglect Assessment (Assessment to be complete while patient is alone) Physical Abuse: Denies Verbal Abuse: Denies Sexual Abuse: Denies       Nutrition Screen- [facility] Adult/[facility] Patient's home diet: Regular  Additional Information 1:1 In Past 12 Months?: No CIRT Risk: No Elopement Risk: No Does patient have medical clearance?: Yes     Disposition: Patient is pending at [facility] Disposition Disposition of Patient: Referred to [facility], ) Other disposition(s): Referred to outside facility [facility]) Patient referred to: [facility]  On Site Evaluation by:   Reviewed with Physician:     Jearld Pies 06/10/2012 9:39 AM

## 2012-06-10 NOTE — ED Notes (Signed)
Pt denies SI and HI at this time, denies hallucinations

## 2012-06-10 NOTE — ED Notes (Signed)
Patient will not open eyes or arouse to verbal stimuli, sternal rub or trapezoid pinch.  However, when arm raised over face and dropped, patient deflects arm from face.  When moving leg, she actively resists movement and returns leg to original position.  Respirations regular and even.  Skin warm and dry.  CBG 86.  Report from prior shift indicated, patient has been resisting taking meds and eating since yesterday.

## 2012-06-10 NOTE — ED Notes (Signed)
Meal given

## 2012-06-10 NOTE — ED Notes (Signed)
Patient's daughter Lucia Estelle phoned for status update.  Informed her of patient's current condition and continue waiting for psychiatric bed.

## 2012-06-10 NOTE — ED Notes (Signed)
Patient refuse to let me take lunch and dinner tray out of room. Once patient went to sleep room cleaned out to keep from getting food poison.

## 2012-06-11 LAB — GLUCOSE, CAPILLARY
Glucose-Capillary: 114 mg/dL — ABNORMAL HIGH (ref 70–99)
Glucose-Capillary: 49 mg/dL — ABNORMAL LOW (ref 70–99)
Glucose-Capillary: 70 mg/dL (ref 70–99)
Glucose-Capillary: 93 mg/dL (ref 70–99)

## 2012-06-11 MED ORDER — INSULIN ASPART 100 UNIT/ML ~~LOC~~ SOLN
SUBCUTANEOUS | Status: AC
  Filled 2012-06-11: qty 1

## 2012-06-11 NOTE — ED Notes (Signed)
Diane, nurse from [facility], called for pt update.

## 2012-06-11 NOTE — ED Notes (Signed)
Patient has ambulated to bathroom tonight, as needed with shackles on ankles.

## 2012-06-11 NOTE — ED Notes (Signed)
Pt up alert and sitting in chair watching TV, informed her to try to get some sleep and she said she would. NAD. [employer] at bedside.

## 2012-06-11 NOTE — ED Notes (Signed)
Pt sitting up in the chair.

## 2012-06-11 NOTE — ED Notes (Signed)
Offered pt ativan due to pt a little shaky while taking vitals and pt accusing [employer] of planning to hurt her, reinforced to pt that she is safe here and no one is here to hurt her, pt also thinks her sister is here, explained to pt that her sister is not here as well, pt refused ativan at this time

## 2012-06-11 NOTE — ED Notes (Signed)
[employer] officer informed the nurse tech that patient voided into the pink pan and dumped it down the sink telling him she had to go right then.   Pink wash basin removed from room and patient instructed to inform the officer any time she needed to go to the bathroom as she is expected to use the bathroom when she needs to eliminate any urine or stool.

## 2012-06-11 NOTE — ED Notes (Signed)
CBG 49, Pt is not symptomatic, concious and aax4. Given apple and grape juice and will re check CBG

## 2012-06-11 NOTE — ED Provider Notes (Signed)
BP 138/81  Pulse 79  Temp(Src) 98.1 F (36.7 C) (Oral)  Resp 18  SpO2 100% Pt stable, no distress, awaiting placement Last glucose was appropriate No acute issues at this time   Joya Gaskins, MD 06/11/12 1557

## 2012-06-11 NOTE — ED Notes (Signed)
Pt ambulated to BR without difficulty

## 2012-06-11 NOTE — ED Notes (Signed)
CBG now 70. Pt given another juice and is eating chips at this time. NAD

## 2012-06-11 NOTE — ED Notes (Signed)
Patient had yellow substance in a wash tub and poured it down the sink before I could take it. Patient also did not want to let go of her food containers. The food containers were removed from the room.

## 2012-06-12 LAB — GLUCOSE, CAPILLARY
Glucose-Capillary: 128 mg/dL — ABNORMAL HIGH (ref 70–99)
Glucose-Capillary: 182 mg/dL — ABNORMAL HIGH (ref 70–99)

## 2012-06-12 MED ORDER — METFORMIN HCL 500 MG PO TABS
ORAL_TABLET | ORAL | Status: AC
  Filled 2012-06-12: qty 2

## 2012-06-12 MED ORDER — METFORMIN HCL ER 500 MG PO TB24
ORAL_TABLET | ORAL | Status: AC
  Filled 2012-06-12: qty 2

## 2012-06-12 MED ORDER — NICOTINE 21 MG/24HR TD PT24
MEDICATED_PATCH | TRANSDERMAL | Status: AC
  Filled 2012-06-12: qty 1

## 2012-06-12 MED ORDER — BENZTROPINE MESYLATE 1 MG PO TABS
ORAL_TABLET | ORAL | Status: AC
  Filled 2012-06-12: qty 1

## 2012-06-12 MED ORDER — LISINOPRIL 10 MG PO TABS
ORAL_TABLET | ORAL | Status: AC
  Filled 2012-06-12: qty 4

## 2012-06-12 MED ORDER — INSULIN ASPART 100 UNIT/ML ~~LOC~~ SOLN
SUBCUTANEOUS | Status: AC
  Filled 2012-06-12: qty 1

## 2012-06-12 MED ORDER — METOPROLOL TARTRATE 25 MG PO TABS
ORAL_TABLET | ORAL | Status: AC
  Filled 2012-06-12: qty 1

## 2012-06-12 MED ORDER — OLANZAPINE 5 MG PO TABS
ORAL_TABLET | ORAL | Status: AC
  Filled 2012-06-12: qty 2

## 2012-06-12 NOTE — ED Notes (Signed)
Patient ambulated to bathroom, deputy walking alongside patient. While patient was walking to room patient speaking to officers saying "I heard them girls talking about me saying I peed on myself, they shouldn't say anything unless they have something nice to say."

## 2012-06-12 NOTE — ED Notes (Signed)
Dianne from [facility] came by and wanted an update on pt. Dianne given update. No further instructions given. Pt aware.

## 2012-06-12 NOTE — ED Notes (Signed)
Pt cooperative with staff. Pt asked to used phone. While on phone pt become agitated. Pt told to take a break from the phone at this time. Pt complied and phone was taken. Pt verbally stated," I ain't crazy, i know my kids are out there fighting." Deputy at bedside. Pt sitting in room, nad noted at this time.

## 2012-06-12 NOTE — ED Notes (Signed)
Windell Moulding, Care Coordinator from [facility], called and asked if pt was still refusing her medication. Windell Moulding also stated that it was taking longer than usual for placement due to Pt being at [facility] frequently. Windell Moulding reported that placement might go faster now that facility will be aware that pt is taking medicine better.

## 2012-06-12 NOTE — ED Notes (Signed)
RN and NT attempted to get 1700 CBG. Pt refused and reported "would do it later". Pt informed would need CBG before pt ate. Pt agreed and reported "ill do it later, that stuff makes me weak."

## 2012-06-12 NOTE — ED Notes (Signed)
Pt took morning po medication without incident. Pt requests to use phone. Phone given but pt unable to remember correct number. RN told pt as long as she remains calm and cooperative she may use phone when she recalls number.

## 2012-06-12 NOTE — ED Notes (Signed)
Patient standing in doorway watching staff intently.

## 2012-06-12 NOTE — ED Notes (Signed)
AC called and notified that pt 1700 dose of metformin XR is not in pyxis or pt held meds. AC reported would call back and see if she had access to get med. Pt aware.

## 2012-06-12 NOTE — ED Notes (Signed)
Notified AC of missing Zyprexa, She is going to check and see if she has it and let me know.

## 2012-06-12 NOTE — ED Notes (Signed)
Other RN attempted to get 1700 CBG. Pt refused. Pt already eating meal tray. Pt reports " I aint going down like that." Pt verbally aggressive when asked to let staff complete tasks.

## 2012-06-12 NOTE — ED Notes (Signed)
Spoke to patient, asked if she needed anything or would like anything to drink. Patient denies wanting anything reports is fine. Informed her to let me know if she needed anything, patient agreed calmly.

## 2012-06-12 NOTE — ED Notes (Signed)
Pt refuses to have CBG and vital signs checked, RN notified

## 2012-06-12 NOTE — BHH Counselor (Signed)
New commitment forms completed and faxed to the [place] of court. Patient continues to wait for a bed a [facility]. Spoke with admissions staff, patient remains on wait list, no idea when a bed may be available. Patient remains delusional with paranoid thoughts. She thinks the staff is talking about her. She thinks the Deputy is planning to hurt her. She remains in shackles and has a [employer]  Sitting with her. Updated Dr Adriana Simas on patient's status.

## 2012-06-13 LAB — GLUCOSE, CAPILLARY
Glucose-Capillary: 131 mg/dL — ABNORMAL HIGH (ref 70–99)
Glucose-Capillary: 157 mg/dL — ABNORMAL HIGH (ref 70–99)

## 2012-06-13 LAB — POTASSIUM: Potassium: 4.2 mEq/L (ref 3.5–5.1)

## 2012-06-13 MED ORDER — BENZTROPINE MESYLATE 1 MG PO TABS
ORAL_TABLET | ORAL | Status: AC
  Administered 2012-06-13: 2 mg via ORAL
  Filled 2012-06-13: qty 2

## 2012-06-13 MED ORDER — METOPROLOL TARTRATE 25 MG PO TABS
ORAL_TABLET | ORAL | Status: AC
  Administered 2012-06-13: 12.5 mg via ORAL
  Filled 2012-06-13: qty 1

## 2012-06-13 MED ORDER — NICOTINE 21 MG/24HR TD PT24
MEDICATED_PATCH | TRANSDERMAL | Status: AC
  Filled 2012-06-13: qty 1

## 2012-06-13 MED ORDER — LISINOPRIL 10 MG PO TABS
ORAL_TABLET | ORAL | Status: AC
  Administered 2012-06-13: 40 mg via ORAL
  Filled 2012-06-13: qty 4

## 2012-06-13 NOTE — ED Provider Notes (Signed)
Patient has been accepted at [facility].   Dione Booze, MD 06/13/12 2038

## 2012-06-13 NOTE — ED Notes (Signed)
When pt was being discharged, she asked for her cigarettes, phone, and truck keys. Only thing in pts belongings were a pair of jeans, some loose change, 1 pair of tennis shoes, shirt and drivers license. Security called about pts phone and phone was not locked up in security. Pts phone or keys were not found.

## 2012-06-13 NOTE — ED Notes (Signed)
Robinette Haines with [facility] called to let us know that [facility] is taking [facility], but they need the new IVC papers, MARS, Vitals, Sugar, K+ level faxed to them.   Nurse informed and repeat K+ ordered.  Will fax Data once completed.  Jerilynn Som wants to be called once we fax this so that he can follow-up with [facility] for the transfer.

## 2012-06-13 NOTE — ED Notes (Signed)
Patient ambulatory to restroom with steady gait.

## 2012-06-13 NOTE — Progress Notes (Signed)
Sitting in chair at doorway, staring out the door, but cooperative: Oriented to self and place. No complaints voiced about being here. Focus continues to be poor when she is talking to you and when asked about her grandchild's well-being she said, "I don't want to talk to you about that". Continues to be paranoid. Nurse states that she is taking her medications, but after they are due to be given. She sleeps late and does not eat breakfast. Spoke to [facility] who said that she does not have a bed yet, but she is close to the top. [phone].

## 2012-07-01 ENCOUNTER — Encounter: Payer: Self-pay | Admitting: Cardiovascular Disease

## 2012-08-10 ENCOUNTER — Emergency Department (HOSPITAL_COMMUNITY)
Admission: EM | Admit: 2012-08-10 | Discharge: 2012-08-11 | Disposition: A | Discharge status: Home / Self Care | Payer: Medicaid Other | Attending: Emergency Medicine | Admitting: Emergency Medicine

## 2012-08-10 ENCOUNTER — Encounter (HOSPITAL_COMMUNITY): Payer: Self-pay | Admitting: *Deleted

## 2012-08-10 DIAGNOSIS — Z794 Long term (current) use of insulin: Secondary | ICD-10-CM | POA: Insufficient documentation

## 2012-08-10 DIAGNOSIS — E7800 Pure hypercholesterolemia, unspecified: Secondary | ICD-10-CM | POA: Insufficient documentation

## 2012-08-10 DIAGNOSIS — F319 Bipolar disorder, unspecified: Secondary | ICD-10-CM | POA: Insufficient documentation

## 2012-08-10 DIAGNOSIS — R05 Cough: Secondary | ICD-10-CM | POA: Insufficient documentation

## 2012-08-10 DIAGNOSIS — Z87442 Personal history of urinary calculi: Secondary | ICD-10-CM | POA: Insufficient documentation

## 2012-08-10 DIAGNOSIS — R059 Cough, unspecified: Secondary | ICD-10-CM | POA: Insufficient documentation

## 2012-08-10 DIAGNOSIS — R079 Chest pain, unspecified: Secondary | ICD-10-CM | POA: Insufficient documentation

## 2012-08-10 DIAGNOSIS — G8929 Other chronic pain: Secondary | ICD-10-CM | POA: Insufficient documentation

## 2012-08-10 DIAGNOSIS — F17200 Nicotine dependence, unspecified, uncomplicated: Secondary | ICD-10-CM | POA: Insufficient documentation

## 2012-08-10 DIAGNOSIS — E119 Type 2 diabetes mellitus without complications: Secondary | ICD-10-CM | POA: Insufficient documentation

## 2012-08-10 DIAGNOSIS — Z79899 Other long term (current) drug therapy: Secondary | ICD-10-CM | POA: Insufficient documentation

## 2012-08-10 DIAGNOSIS — I10 Essential (primary) hypertension: Secondary | ICD-10-CM | POA: Insufficient documentation

## 2012-08-10 DIAGNOSIS — F209 Schizophrenia, unspecified: Secondary | ICD-10-CM | POA: Insufficient documentation

## 2012-08-10 NOTE — ED Notes (Addendum)
Pt to department via EMS.  Reporting cough for "about a month".  Reports cough mildly productive.  Pt also reporting chest pain "which I've had for years."  No distress noted during triage.  Pt reports "I've been up seen several times for pain.  They could never tell me if I had a heart attack or stroke, but I know I had something."

## 2012-08-10 NOTE — ED Notes (Addendum)
Well known pt to this ED and this nurse. Presents calm, cooperative, intact with reality. No paranoid delusions noted. Pt states she's been taking her medications and feels good mentally. Pt has been wanded by security.

## 2012-08-11 ENCOUNTER — Emergency Department (HOSPITAL_COMMUNITY): Payer: Medicaid Other

## 2012-08-11 ENCOUNTER — Emergency Department (HOSPITAL_COMMUNITY)
Admission: EM | Admit: 2012-08-11 | Discharge: 2012-08-11 | Disposition: A | Discharge status: Home / Self Care | Payer: Medicaid Other | Attending: Emergency Medicine | Admitting: Emergency Medicine

## 2012-08-11 ENCOUNTER — Encounter (HOSPITAL_COMMUNITY): Payer: Self-pay | Admitting: *Deleted

## 2012-08-11 DIAGNOSIS — E119 Type 2 diabetes mellitus without complications: Secondary | ICD-10-CM | POA: Insufficient documentation

## 2012-08-11 DIAGNOSIS — R05 Cough: Secondary | ICD-10-CM | POA: Insufficient documentation

## 2012-08-11 DIAGNOSIS — I10 Essential (primary) hypertension: Secondary | ICD-10-CM | POA: Insufficient documentation

## 2012-08-11 DIAGNOSIS — F17200 Nicotine dependence, unspecified, uncomplicated: Secondary | ICD-10-CM | POA: Insufficient documentation

## 2012-08-11 DIAGNOSIS — IMO0001 Reserved for inherently not codable concepts without codable children: Secondary | ICD-10-CM | POA: Insufficient documentation

## 2012-08-11 DIAGNOSIS — E7800 Pure hypercholesterolemia, unspecified: Secondary | ICD-10-CM | POA: Insufficient documentation

## 2012-08-11 DIAGNOSIS — Z87442 Personal history of urinary calculi: Secondary | ICD-10-CM | POA: Insufficient documentation

## 2012-08-11 DIAGNOSIS — R52 Pain, unspecified: Secondary | ICD-10-CM | POA: Insufficient documentation

## 2012-08-11 DIAGNOSIS — J40 Bronchitis, not specified as acute or chronic: Secondary | ICD-10-CM | POA: Insufficient documentation

## 2012-08-11 DIAGNOSIS — Z79899 Other long term (current) drug therapy: Secondary | ICD-10-CM | POA: Insufficient documentation

## 2012-08-11 DIAGNOSIS — Z794 Long term (current) use of insulin: Secondary | ICD-10-CM | POA: Insufficient documentation

## 2012-08-11 DIAGNOSIS — F411 Generalized anxiety disorder: Secondary | ICD-10-CM | POA: Insufficient documentation

## 2012-08-11 DIAGNOSIS — F319 Bipolar disorder, unspecified: Secondary | ICD-10-CM | POA: Insufficient documentation

## 2012-08-11 DIAGNOSIS — R059 Cough, unspecified: Secondary | ICD-10-CM | POA: Insufficient documentation

## 2012-08-11 LAB — COMPREHENSIVE METABOLIC PANEL
ALT: 16 U/L (ref 0–35)
AST: 12 U/L (ref 0–37)
Alkaline Phosphatase: 131 U/L — ABNORMAL HIGH (ref 39–117)
CO2: 28 mEq/L (ref 19–32)
Calcium: 10 mg/dL (ref 8.4–10.5)
GFR calc non Af Amer: >90 mL/min (ref >90)
Potassium: 3.5 mEq/L (ref 3.5–5.1)
Sodium: 139 mEq/L (ref 135–145)
Total Protein: 8 g/dL (ref 6.0–8.3)

## 2012-08-11 LAB — CBC WITH DIFFERENTIAL/PLATELET
Basophils Absolute: 0 10*3/uL (ref 0.0–0.1)
Eosinophils Relative: 1 % (ref 0–5)
Lymphocytes Relative: 28 % (ref 12–46)
Lymphs Abs: 3.6 10*3/uL (ref 0.7–4.0)
MCV: 90.8 fL (ref 78.0–100.0)
Neutrophils Relative %: 66 % (ref 43–77)
Platelets: 383 10*3/uL (ref 150–400)
RBC: 4.12 MIL/uL (ref 3.87–5.11)
RDW: 14.2 % (ref 11.5–15.5)
WBC: 12.8 10*3/uL — ABNORMAL HIGH (ref 4.0–10.5)

## 2012-08-11 LAB — POCT PREGNANCY, URINE: Preg Test, Ur: NEGATIVE

## 2012-08-11 MED ORDER — GUAIFENESIN-CODEINE 100-10 MG/5ML PO SOLN
10.0000 mL | Freq: Once | ORAL | Status: AC
  Administered 2012-08-11: 10 mL via ORAL
  Filled 2012-08-11: qty 10

## 2012-08-11 MED ORDER — AZITHROMYCIN 250 MG PO TABS: 250.0000 mg | ORAL_TABLET | Freq: Every day | ORAL | Status: DC

## 2012-08-11 MED ORDER — GUAIFENESIN-CODEINE 100-10 MG/5ML PO SYRP: 10.0000 mL | ORAL_SOLUTION | Freq: Three times a day (TID) | ORAL | Status: DC | PRN

## 2012-08-11 MED ORDER — AZITHROMYCIN 250 MG PO TABS
500.0000 mg | ORAL_TABLET | Freq: Once | ORAL | Status: AC
Start: 2012-08-11 — End: 2012-08-11
  Administered 2012-08-11: 500 mg via ORAL
  Filled 2012-08-11: qty 2

## 2012-08-11 NOTE — ED Notes (Signed)
Pt ambulated to bathroom, pt did not get enough urine to send to lab, will try again

## 2012-08-11 NOTE — ED Notes (Signed)
[facility] called out for CP, seen for same last night, [facility] reports that pt anxious and was pacing at scene

## 2012-08-11 NOTE — ED Provider Notes (Signed)
History     [name]: [id number]  Arrival date & time 08/10/12  2224   First MD Initiated Contact with Patient 08/10/12 2357      Chief Complaint  Patient presents with  . Cough  . Chest Pain     Patient is a 42 y.o. female presenting with cough. The history is provided by the patient.  Cough Cough characteristics:  Productive Severity:  Mild Onset quality:  Gradual Duration: 1 month. Timing:  Intermittent Progression:  Worsening Chronicity:  Recurrent Relieved by:  Nothing Associated symptoms: chest pain   Associated symptoms: no fever   pt here for cough and chest pain She reports cough for a month  She also reports chronic chest pain that is not new.  She reports SOB but none at this time   Past Medical History  Diagnosis Date  . Schizophrenia   . Bipolar disorder   . Diabetes mellitus   . Hypercholesteremia   . Hypertension   . Kidney stone     Past Surgical History  Procedure Laterality Date  . Cholecystectomy    . Tubal ligation      History reviewed. No pertinent family history.  History  Substance Use Topics  . Smoking status: Current Every Day Smoker -- 1.00 packs/day  . Smokeless tobacco: Not on file  . Alcohol Use: No    OB History   Grav Para Term Preterm Abortions TAB SAB Ect Mult Living                  Review of Systems  Constitutional: Negative for fever.  Respiratory: Positive for cough.   Cardiovascular: Positive for chest pain.  Gastrointestinal: Negative for vomiting.  Neurological: Negative for weakness.    Allergies  Etomidate and Haldol  Home Medications   Current Outpatient Rx  Name  Route  Sig  Dispense  Refill  . albuterol (PROVENTIL HFA;VENTOLIN HFA) 108 (90 BASE) MCG/ACT inhaler   Inhalation   Inhale 2 puffs into the lungs 4 (four) times daily.         . Aspirin-Salicylamide-Caffeine (BC HEADACHE) 325-95-16 MG TABS   Oral   Take 1 packet by mouth daily as needed (for headache pain).         . benztropine  (COGENTIN) 2 MG tablet   Oral   Take 2 mg by mouth 2 (two) times daily.         . busPIRone (BUSPAR) 10 MG tablet   Oral   Take 1 tablet (10 mg total) by mouth 3 (three) times daily.   90 tablet   0   . gabapentin (NEURONTIN) 300 MG capsule   Oral   Take 300 mg by mouth at bedtime.         . insulin aspart protamine-insulin aspart (NOVOLOG 70/30) (70-30) 100 UNIT/ML injection   Subcutaneous   Inject 40-60 Units into the skin 2 (two) times daily. 60 units in the AM and 40 units at night         . lisinopril (PRINIVIL,ZESTRIL) 40 MG tablet   Oral   Take 40 mg by mouth daily.         Marland Kitchen lithium 300 MG tablet   Oral   Take 300 mg by mouth 2 (two) times daily.         . metFORMIN (GLUCOPHAGE-XR) 500 MG 24 hr tablet   Oral   Take 1,000 mg by mouth 2 (two) times daily.         Marland Kitchen  metoprolol tartrate (LOPRESSOR) 25 MG tablet   Oral   Take 12.5 mg by mouth 2 (two) times daily.         Marland Kitchen OLANZapine (ZYPREXA) 10 MG tablet   Oral   Take 10 mg by mouth at bedtime.         . Paliperidone Palmitate (INVEGA SUSTENNA) 234 MG/1.5ML SUSP   Intramuscular   Inject 234 mg into the muscle every 28 (twenty-eight) days.          . simvastatin (ZOCOR) 20 MG tablet   Oral   Take 20 mg by mouth at bedtime.           BP 164/97  Pulse 92  Temp(Src) 98.6 F (37 C) (Oral)  Resp 18  Ht 5\' 6"  (1.676 m)  Wt 260 lb (117.935 kg)  BMI 41.99 kg/m2  SpO2 98%  Physical Exam CONSTITUTIONAL: Well developed/well nourished HEAD: Normocephalic/atraumatic EYES: EOMI/PERRL ENMT: Mucous membranes moist NECK: supple no meningeal signs CV: S1/S2 noted, no murmurs/rubs/gallops noted LUNGS: Lungs are clear to auscultation bilaterally, no apparent distress ABDOMEN: soft, nontender, no rebound or guarding GU:no cva tenderness NEURO: Pt is awake/alert, moves all extremitiesx4 EXTREMITIES: pulses normal, full ROM SKIN: warm, color normal PSYCH: flat affect  ED Course  Procedures    Labs Reviewed - No data to display Dg Chest 2 View  08/11/2012   *RADIOLOGY REPORT*  Clinical Data: Cough and chest pain.  CHEST - 2 VIEW  Comparison: 10/29/2011 and prior chest radiographs  Findings: Mild cardiomegaly and mild peribronchial thickening again noted. There is no evidence of focal airspace disease, pulmonary edema, suspicious pulmonary nodule/mass, pleural effusion, or pneumothorax. No acute bony abnormalities are identified.  IMPRESSION: No evidence of acute cardiopulmonary disease.  Cardiomegaly and mild chronic peribronchial thickening.   Original Report Authenticated By: Harmon Pier, M.D.     1. Cough    For cough, CXR negative and in no distress For her CP, this appears chronic I doubt PE/ACS/CHF at this time Pt is well appearing Stable for d/c   For prolonged QT I told her stop taking trazodone and may need stop her invega which is an injection, advised f/u with her psychiatrist  MDM  Nursing notes including past medical history and social history reviewed and considered in documentation xrays reviewed and considered    Date: 08/11/2012  Rate: 84  Rhythm: normal sinus rhythm  QRS Axis: normal  Intervals: QT prolonged  ST/T Wave abnormalities: nonspecific ST changes  Conduction Disutrbances:none  Narrative Interpretation:   Old EKG Reviewed: qt prolongation seen in prior          Joya Gaskins, MD 08/11/12 [phone]

## 2012-08-11 NOTE — ED Notes (Signed)
Pt alert & oriented x4, stable gait. Patient  given discharge instructions, paperwork & prescription(s). Patient verbalized understanding. Pt left department w/ no further questions. 

## 2012-08-11 NOTE — ED Notes (Signed)
Pt alert & oriented x4, stable gait. Patient given discharge instructions, paperwork & prescription(s). Patient  instructed to stop at the registration desk to finish any additional paperwork. Patient verbalized understanding. Pt left department w/ no further questions. 

## 2012-08-11 NOTE — ED Provider Notes (Signed)
History     [name]: [id number]  Arrival date & time 08/11/12  1933   First MD Initiated Contact with Patient 08/11/12 2107      Chief Complaint  Patient presents with  . Chest Pain  . Anxiety    (Consider location/radiation/quality/duration/timing/severity/associated sxs/prior treatment) HPI Comments: The patient is a 42 year old woman who complains that she is very ill. She hurts all over. She notes that she has a bad cough. She notes not on her neck. She feels pain in her chest. She says that she's taking her medications for mental health, but doesn't know the names of her medicines.  Review of prior charts shows a history of mental illness, with [facility] April of 2014. She is followed for her mental health issues at [facility].  She had been seen last night in [facility] ED, and had physical examination, EKG and chest x-ray, which were negative, and she was released. After being released, she says she was arrested and taken to jail. She returns with essentially the same complaints, cough, chest pain, and aching all over, and being firmly convinced that she is ill in some way.  Patient is a 42 y.o. female presenting with anxiety and general illness. The history is provided by the patient and medical records. No language interpreter was used.  Anxiety Associated symptoms include chest pain.  Illness Location:  She has cough, chest pain, and generalized body aching. Severity:  Moderate Onset quality: She has had these complaints for some time. Timing:  Constant Progression:  Unchanged Chronicity:  Recurrent Associated symptoms: chest pain, cough and myalgias   Associated symptoms: no fever     Past Medical History  Diagnosis Date  . Schizophrenia   . Bipolar disorder   . Diabetes mellitus   . Hypercholesteremia   . Hypertension   . Kidney stone     Past Surgical History  Procedure Laterality Date  .  Cholecystectomy    . Tubal ligation      No family history on file.  History  Substance Use Topics  . Smoking status: Current Every Day Smoker -- 1.00 packs/day  . Smokeless tobacco: Not on file  . Alcohol Use: No    OB History   Grav Para Term Preterm Abortions TAB SAB Ect Mult Living                  Review of Systems  Constitutional: Negative for fever and chills.  HENT: Negative.   Eyes: Negative.   Respiratory: Positive for cough.   Cardiovascular: Positive for chest pain.  Gastrointestinal: Negative.   Genitourinary: Negative.   Musculoskeletal: Positive for myalgias.  Skin: Negative.   Neurological: Negative.   Psychiatric/Behavioral:       Chronic mental illness.    Allergies  Etomidate and Haldol  Home Medications   Current Outpatient Rx  Name  Route  Sig  Dispense  Refill  . albuterol (PROVENTIL HFA;VENTOLIN HFA) 108 (90 BASE) MCG/ACT inhaler   Inhalation   Inhale 2 puffs into the lungs 4 (four) times daily.         . Aspirin-Salicylamide-Caffeine (BC HEADACHE) 325-95-16 MG TABS   Oral   Take 1 packet by mouth daily as needed (for headache pain).         . benztropine (COGENTIN) 2 MG tablet   Oral   Take 2 mg by mouth 2 (two) times daily.         Marland Kitchen  busPIRone (BUSPAR) 10 MG tablet   Oral   Take 1 tablet (10 mg total) by mouth 3 (three) times daily.   90 tablet   0   . gabapentin (NEURONTIN) 300 MG capsule   Oral   Take 300 mg by mouth at bedtime.         . insulin aspart protamine-insulin aspart (NOVOLOG 70/30) (70-30) 100 UNIT/ML injection   Subcutaneous   Inject 40-60 Units into the skin 2 (two) times daily. 60 units in the AM and 40 units at night         . lisinopril (PRINIVIL,ZESTRIL) 40 MG tablet   Oral   Take 40 mg by mouth daily.         Marland Kitchen lithium 300 MG tablet   Oral   Take 300 mg by mouth 2 (two) times daily.         . metFORMIN (GLUCOPHAGE-XR) 500 MG 24 hr tablet   Oral   Take 1,000 mg by mouth 2 (two)  times daily.         . metoprolol tartrate (LOPRESSOR) 25 MG tablet   Oral   Take 12.5 mg by mouth 2 (two) times daily.         Marland Kitchen OLANZapine (ZYPREXA) 10 MG tablet   Oral   Take 10 mg by mouth at bedtime.         . Paliperidone Palmitate (INVEGA SUSTENNA) 234 MG/1.5ML SUSP   Intramuscular   Inject 234 mg into the muscle every 28 (twenty-eight) days.          . simvastatin (ZOCOR) 20 MG tablet   Oral   Take 20 mg by mouth at bedtime.           BP 174/104  Pulse 101  Temp(Src) 99.2 F (37.3 C) (Oral)  Resp 22  Ht 5' 0.5" (1.537 m)  Wt 250 lb (113.399 kg)  BMI 48 kg/m2  SpO2 100%  Physical Exam  Nursing note and vitals reviewed. Constitutional:  Obese middle-aged woman with a hacking cough.    HENT:  Head: Normocephalic and atraumatic.  Right Ear: External ear normal.  Left Ear: External ear normal.  Mouth/Throat: Oropharynx is clear and moist.  Her breath smells of stale tobacco.   Eyes: Conjunctivae and EOM are normal. Pupils are equal, round, and reactive to light.  Neck: Normal range of motion. Neck supple.  Cardiovascular: Normal rate, regular rhythm and normal heart sounds.   Pulmonary/Chest: Breath sounds normal.  Abdominal: Soft. Bowel sounds are normal.  Musculoskeletal: Normal range of motion. She exhibits no edema and no tenderness.  Lymphadenopathy:    She has no cervical adenopathy.  Neurological:  Drowsy but wakes up readily.  No sensory or motor deficit.  Skin: Skin is warm and dry.  She has several tiny sebaceous cysts around her ears that are not infected.  She has a scar on her right upper back that she says was where a cyst was excised.  Psychiatric: She has a normal mood and affect. Her behavior is normal.    ED Course  Procedures (including critical care time)  Labs Reviewed  CBC WITH DIFFERENTIAL  COMPREHENSIVE METABOLIC PANEL  URINALYSIS, ROUTINE W REFLEX MICROSCOPIC  ETHANOL  URINE RAPID DRUG SCREEN (HOSP PERFORMED)   Dg  Chest 2 View  08/11/2012   *RADIOLOGY REPORT*  Clinical Data: Cough and chest pain.  CHEST - 2 VIEW  Comparison: 10/29/2011 and prior chest radiographs  Findings: Mild cardiomegaly and mild peribronchial thickening  again noted. There is no evidence of focal airspace disease, pulmonary edema, suspicious pulmonary nodule/mass, pleural effusion, or pneumothorax. No acute bony abnormalities are identified.  IMPRESSION: No evidence of acute cardiopulmonary disease.  Cardiomegaly and mild chronic peribronchial thickening.   Original Report Authenticated By: Harmon Pier, M.D.   9:33 PM Pt was seen and had physical examination.  Lab workup was ordered.  Old charts were reviewed.  Results for orders placed during the hospital encounter of 08/11/12  CBC WITH DIFFERENTIAL      Result Value Range   WBC 12.8 (*) 4.0 - 10.5 K/uL   RBC 4.12  3.87 - 5.11 MIL/uL   Hemoglobin 12.7  12.0 - 15.0 g/dL   HCT 16.1  09.6 - 04.5 %   MCV 90.8  78.0 - 100.0 fL   MCH 30.8  26.0 - 34.0 pg   MCHC 34.0  30.0 - 36.0 g/dL   RDW 40.9  81.1 - 91.4 %   Platelets 383  150 - 400 K/uL   Neutrophils Relative % 66  43 - 77 %   Neutro Abs 8.4 (*) 1.7 - 7.7 K/uL   Lymphocytes Relative 28  12 - 46 %   Lymphs Abs 3.6  0.7 - 4.0 K/uL   Monocytes Relative 5  3 - 12 %   Monocytes Absolute 0.7  0.1 - 1.0 K/uL   Eosinophils Relative 1  0 - 5 %   Eosinophils Absolute 0.2  0.0 - 0.7 K/uL   Basophils Relative 0  0 - 1 %   Basophils Absolute 0.0  0.0 - 0.1 K/uL  COMPREHENSIVE METABOLIC PANEL      Result Value Range   Sodium 139  135 - 145 mEq/L   Potassium 3.5  3.5 - 5.1 mEq/L   Chloride 100  96 - 112 mEq/L   CO2 28  19 - 32 mEq/L   Glucose, Bld 208 (*) 70 - 99 mg/dL   BUN 7  6 - 23 mg/dL   Creatinine, Ser 7.82  0.50 - 1.10 mg/dL   Calcium 95.6  8.4 - 21.3 mg/dL   Total Protein 8.0  6.0 - 8.3 g/dL   Albumin 4.1  3.5 - 5.2 g/dL   AST 12  0 - 37 U/L   ALT 16  0 - 35 U/L   Alkaline Phosphatase 131 (*) 39 - 117 U/L   Total Bilirubin  0.2 (*) 0.3 - 1.2 mg/dL   GFR calc non Af Amer >90  >90 mL/min   GFR calc Af Amer >90  >90 mL/min  ETHANOL      Result Value Range   Alcohol, Ethyl (B) <11  0 - 11 mg/dL  LITHIUM LEVEL      Result Value Range   Lithium Lvl <0.25 (*) 0.80 - 1.40 mEq/L  POCT PREGNANCY, URINE      Result Value Range   Preg Test, Ur NEGATIVE  NEGATIVE   Dg Chest 2 View  08/11/2012   *RADIOLOGY REPORT*  Clinical Data: Cough and chest pain.  CHEST - 2 VIEW  Comparison: 10/29/2011 and prior chest radiographs  Findings: Mild cardiomegaly and mild peribronchial thickening again noted. There is no evidence of focal airspace disease, pulmonary edema, suspicious pulmonary nodule/mass, pleural effusion, or pneumothorax. No acute bony abnormalities are identified.  IMPRESSION: No evidence of acute cardiopulmonary disease.  Cardiomegaly and mild chronic peribronchial thickening.   Original Report Authenticated By: Harmon Pier, M.D.    I advised Z-pack and Robitussin AC to  treat bronchitis, and advised her to stop smoking.   1. Bronchitis           Carleene Cooper III, MD 08/12/12 [phone]

## 2012-08-12 ENCOUNTER — Other Ambulatory Visit: Payer: Self-pay

## 2012-08-12 ENCOUNTER — Encounter (HOSPITAL_COMMUNITY): Payer: Self-pay | Admitting: *Deleted

## 2012-08-12 ENCOUNTER — Emergency Department (HOSPITAL_COMMUNITY)
Admission: EM | Admit: 2012-08-12 | Discharge: 2012-08-12 | Disposition: A | Discharge status: Home / Self Care | Payer: Medicaid Other | Attending: Emergency Medicine | Admitting: Emergency Medicine

## 2012-08-12 DIAGNOSIS — F209 Schizophrenia, unspecified: Secondary | ICD-10-CM | POA: Insufficient documentation

## 2012-08-12 DIAGNOSIS — Z79899 Other long term (current) drug therapy: Secondary | ICD-10-CM | POA: Insufficient documentation

## 2012-08-12 DIAGNOSIS — M791 Myalgia, unspecified site: Secondary | ICD-10-CM

## 2012-08-12 DIAGNOSIS — F17200 Nicotine dependence, unspecified, uncomplicated: Secondary | ICD-10-CM | POA: Insufficient documentation

## 2012-08-12 DIAGNOSIS — Z87442 Personal history of urinary calculi: Secondary | ICD-10-CM | POA: Insufficient documentation

## 2012-08-12 DIAGNOSIS — R05 Cough: Secondary | ICD-10-CM | POA: Insufficient documentation

## 2012-08-12 DIAGNOSIS — R0602 Shortness of breath: Secondary | ICD-10-CM | POA: Insufficient documentation

## 2012-08-12 DIAGNOSIS — I10 Essential (primary) hypertension: Secondary | ICD-10-CM | POA: Insufficient documentation

## 2012-08-12 DIAGNOSIS — IMO0001 Reserved for inherently not codable concepts without codable children: Secondary | ICD-10-CM | POA: Insufficient documentation

## 2012-08-12 DIAGNOSIS — F319 Bipolar disorder, unspecified: Secondary | ICD-10-CM | POA: Insufficient documentation

## 2012-08-12 DIAGNOSIS — R509 Fever, unspecified: Secondary | ICD-10-CM | POA: Insufficient documentation

## 2012-08-12 DIAGNOSIS — E7800 Pure hypercholesterolemia, unspecified: Secondary | ICD-10-CM | POA: Insufficient documentation

## 2012-08-12 DIAGNOSIS — R0789 Other chest pain: Secondary | ICD-10-CM | POA: Insufficient documentation

## 2012-08-12 DIAGNOSIS — E119 Type 2 diabetes mellitus without complications: Secondary | ICD-10-CM | POA: Insufficient documentation

## 2012-08-12 DIAGNOSIS — R079 Chest pain, unspecified: Secondary | ICD-10-CM

## 2012-08-12 DIAGNOSIS — R059 Cough, unspecified: Secondary | ICD-10-CM | POA: Insufficient documentation

## 2012-08-12 DIAGNOSIS — Z794 Long term (current) use of insulin: Secondary | ICD-10-CM | POA: Insufficient documentation

## 2012-08-12 NOTE — ED Notes (Signed)
Pt escorted off premises by [employer].

## 2012-08-12 NOTE — ED Notes (Signed)
Pt to department via EMS.   Continue complaint of CP and cough that she has had "for months".

## 2012-08-12 NOTE — ED Notes (Signed)
Pt was instructed by Dr. Bebe Shaggy to follow up with PCP tomorrow and explain she has been seen at ER 3 times in past 24 hours for complaints of cough and pain.  Pt also instructed to fill prescriptions previously given her.  Pt refused to sign discharge papers.  At present, pt refusing to leave department.  [employer] at bedside at this time.

## 2012-08-12 NOTE — ED Notes (Addendum)
Pt continues to c/o chest pain and cough.  Ambulatory, no distress noted.  Pt's daughter called approximately 20 minutes ago stating that pt said she was given her medications in the ER and daughter was wanting to verify before pt took possible second dose.  Informed daughter that pt's regular home meds were not given in department.

## 2012-08-12 NOTE — ED Provider Notes (Signed)
History     [name]: [id number]  Arrival date & time 08/12/12  0043   First MD Initiated Contact with Patient 08/12/12 0045      Chief Complaint  Patient presents with  . Chest Pain     Patient is a 42 y.o. female presenting with cough. The history is provided by the patient.  Cough Severity:  Mild Onset quality:  Gradual Duration: "awhile ago" Timing:  Intermittent Progression:  Worsening Chronicity:  Chronic Worsened by:  Nothing tried Associated symptoms: chest pain, chills, fever, myalgias and shortness of breath   patient presents for multiple complaints She reports nonproductive cough, chest wall pain, chills, fever, myalgias and feeling short of breath She reports cough and CP have been present for "awhile" No LE edema No vomiting reported  She was just seen in the ED for similar complaints.  She reports she is "no better" and "I am a sick woman" She had been given prescriptions but has not had them filled as of yet.  She has h/o mental illness but she denies any attempt to harm herself She reports compliance with her home medications  Past Medical History  Diagnosis Date  . Schizophrenia   . Bipolar disorder   . Diabetes mellitus   . Hypercholesteremia   . Hypertension   . Kidney stone     Past Surgical History  Procedure Laterality Date  . Cholecystectomy    . Tubal ligation      No family history on file.  History  Substance Use Topics  . Smoking status: Current Every Day Smoker -- 1.00 packs/day  . Smokeless tobacco: Not on file  . Alcohol Use: No    OB History   Grav Para Term Preterm Abortions TAB SAB Ect Mult Living                  Review of Systems  Constitutional: Positive for fever and chills.  Respiratory: Positive for cough and shortness of breath.   Cardiovascular: Positive for chest pain. Negative for leg swelling.  Gastrointestinal: Negative for vomiting.  Musculoskeletal: Positive for myalgias.    Allergies  Etomidate and  Haldol  Home Medications   Current Outpatient Rx  Name  Route  Sig  Dispense  Refill  . albuterol (PROVENTIL HFA;VENTOLIN HFA) 108 (90 BASE) MCG/ACT inhaler   Inhalation   Inhale 2 puffs into the lungs 4 (four) times daily.         . Aspirin-Salicylamide-Caffeine (BC HEADACHE) 325-95-16 MG TABS   Oral   Take 1 packet by mouth daily as needed (for headache pain).         Marland Kitchen azithromycin (ZITHROMAX Z-PAK) 250 MG tablet   Oral   Take 1 tablet (250 mg total) by mouth daily.   4 tablet   0   . benztropine (COGENTIN) 2 MG tablet   Oral   Take 2 mg by mouth 2 (two) times daily.         . busPIRone (BUSPAR) 10 MG tablet   Oral   Take 1 tablet (10 mg total) by mouth 3 (three) times daily.   90 tablet   0   . gabapentin (NEURONTIN) 300 MG capsule   Oral   Take 300 mg by mouth at bedtime.         Marland Kitchen guaiFENesin-codeine (ROBITUSSIN AC) 100-10 MG/5ML syrup   Oral   Take 10 mLs by mouth 3 (three) times daily as needed for cough (Take 2 teaspoons every 4 hours if  needed for cough.).   120 mL   0   . insulin aspart protamine-insulin aspart (NOVOLOG 70/30) (70-30) 100 UNIT/ML injection   Subcutaneous   Inject 40-60 Units into the skin 2 (two) times daily. 60 units in the AM and 40 units at night         . lisinopril (PRINIVIL,ZESTRIL) 40 MG tablet   Oral   Take 40 mg by mouth daily.         Marland Kitchen lithium 300 MG tablet   Oral   Take 300 mg by mouth 2 (two) times daily.         . metFORMIN (GLUCOPHAGE-XR) 500 MG 24 hr tablet   Oral   Take 1,000 mg by mouth 2 (two) times daily.         . metoprolol tartrate (LOPRESSOR) 25 MG tablet   Oral   Take 12.5 mg by mouth 2 (two) times daily.         Marland Kitchen OLANZapine (ZYPREXA) 10 MG tablet   Oral   Take 10 mg by mouth at bedtime.         . Paliperidone Palmitate (INVEGA SUSTENNA) 234 MG/1.5ML SUSP   Intramuscular   Inject 234 mg into the muscle every 28 (twenty-eight) days.          . simvastatin (ZOCOR) 20 MG  tablet   Oral   Take 20 mg by mouth at bedtime.           BP 161/94  Pulse 91  Temp(Src) 98.4 F (36.9 C) (Oral)  Resp 18  SpO2 100%  Physical Exam CONSTITUTIONAL: Well developed/well nourished, no distress is noted HEAD: Normocephalic/atraumatic EYES: EOMI/PERRL ENMT: Mucous membranes moist, voice normal and at baseline, no abnormal phonation NECK: supple no meningeal signs CV: S1/S2 noted, no murmurs/rubs/gallops noted Chest- diffuse chest wall tenderness LUNGS: Lungs are clear to auscultation bilaterally, no apparent distress, she is able to speak to me clearly ABDOMEN: soft, nontender, no rebound or guarding GU:no cva tenderness NEURO: Pt is awake/alert, moves all extremitiesx4, Gait without ataxia.  EXTREMITIES: pulses normal, full ROM, no LE edema and no calf tenderness. No signs of trauma to extremities SKIN: warm, color normal PSYCH: flat affect  ED Course  Procedures   1:16 AM This is the third ED visit for this patient since 08/10/12.  She has presented for essentially the same complaint.  Recent CXR negative for acute disease and her labs were reassuring.  Her EKG is essentially unchanged at this time (continued QT prolongation, will tell her to avoid zpack and already been given instructions to stop trazodone and hold her invega injection to due QT prolongation)  She does not appear psychiatrically unstable.  She is not psychotic  No evidence of CHF.  I doubt she has a PE.  Low suspicion for ACS She essentially presents with viral syndrome (cough, myalgais, chills) No signs of meningitis or serious bacterial illness Do not feel further workup is necessary at this time  MDM  Nursing notes including past medical history and social history reviewed and considered in documentation Previous records reviewed and considered     Date: 08/12/2012 1610RU  Rate: 85  Rhythm: normal sinus rhythm  QRS Axis: normal  Intervals: QT prolonged  ST/T Wave  abnormalities: nonspecific ST changes  Conduction Disutrbances:none  Narrative Interpretation:   Old EKG Reviewed: unchanged from 08/11/12 at 0025AM)       Joya Gaskins, MD 08/12/12 [phone]

## 2012-08-13 ENCOUNTER — Encounter (HOSPITAL_COMMUNITY): Payer: Self-pay

## 2012-08-13 ENCOUNTER — Emergency Department (HOSPITAL_COMMUNITY)
Admission: EM | Admit: 2012-08-13 | Discharge: 2012-08-15 | Disposition: A | Discharge status: Home / Self Care | Payer: MEDICAID | Attending: Emergency Medicine | Admitting: Emergency Medicine

## 2012-08-13 DIAGNOSIS — E119 Type 2 diabetes mellitus without complications: Secondary | ICD-10-CM | POA: Insufficient documentation

## 2012-08-13 DIAGNOSIS — Z87442 Personal history of urinary calculi: Secondary | ICD-10-CM | POA: Insufficient documentation

## 2012-08-13 DIAGNOSIS — E7800 Pure hypercholesterolemia, unspecified: Secondary | ICD-10-CM | POA: Insufficient documentation

## 2012-08-13 DIAGNOSIS — I10 Essential (primary) hypertension: Secondary | ICD-10-CM | POA: Insufficient documentation

## 2012-08-13 DIAGNOSIS — Z79899 Other long term (current) drug therapy: Secondary | ICD-10-CM | POA: Insufficient documentation

## 2012-08-13 DIAGNOSIS — F209 Schizophrenia, unspecified: Secondary | ICD-10-CM | POA: Insufficient documentation

## 2012-08-13 DIAGNOSIS — F319 Bipolar disorder, unspecified: Secondary | ICD-10-CM

## 2012-08-13 DIAGNOSIS — Z794 Long term (current) use of insulin: Secondary | ICD-10-CM | POA: Insufficient documentation

## 2012-08-13 DIAGNOSIS — F17200 Nicotine dependence, unspecified, uncomplicated: Secondary | ICD-10-CM | POA: Insufficient documentation

## 2012-08-13 LAB — CBC WITH DIFFERENTIAL/PLATELET
Basophils Absolute: 0 10*3/uL (ref 0.0–0.1)
Basophils Relative: 0 % (ref 0–1)
Eosinophils Absolute: 0.2 10*3/uL (ref 0.0–0.7)
Eosinophils Relative: 2 % (ref 0–5)
HCT: 37.2 % (ref 36.0–46.0)
Hemoglobin: 12.7 g/dL (ref 12.0–15.0)
Lymphocytes Relative: 28 % (ref 12–46)
Lymphs Abs: 2.7 10*3/uL (ref 0.7–4.0)
MCH: 31 pg (ref 26.0–34.0)
MCHC: 34.1 g/dL (ref 30.0–36.0)
MCV: 90.7 fL (ref 78.0–100.0)
Monocytes Absolute: 0.4 10*3/uL (ref 0.1–1.0)
Monocytes Relative: 5 % (ref 3–12)
Neutro Abs: 6.1 10*3/uL (ref 1.7–7.7)
Neutrophils Relative %: 65 % (ref 43–77)
Platelets: 372 10*3/uL (ref 150–400)
RBC: 4.1 MIL/uL (ref 3.87–5.11)
RDW: 13.9 % (ref 11.5–15.5)
WBC: 9.4 10*3/uL (ref 4.0–10.5)

## 2012-08-13 LAB — COMPREHENSIVE METABOLIC PANEL
ALT: 17 U/L (ref 0–35)
AST: 12 U/L (ref 0–37)
Albumin: 3.9 g/dL (ref 3.5–5.2)
Alkaline Phosphatase: 118 U/L — ABNORMAL HIGH (ref 39–117)
BUN: 7 mg/dL (ref 6–23)
CO2: 25 mEq/L (ref 19–32)
Calcium: 9.9 mg/dL (ref 8.4–10.5)
Chloride: 97 mEq/L (ref 96–112)
Creatinine, Ser: 0.67 mg/dL (ref 0.50–1.10)
GFR calc Af Amer: >90 mL/min (ref >90)
GFR calc non Af Amer: >90 mL/min (ref >90)
Glucose, Bld: 320 mg/dL — ABNORMAL HIGH (ref 70–99)
Potassium: 3.5 mEq/L (ref 3.5–5.1)
Sodium: 136 mEq/L (ref 135–145)
Total Bilirubin: 0.1 mg/dL — ABNORMAL LOW (ref 0.3–1.2)
Total Protein: 7.6 g/dL (ref 6.0–8.3)

## 2012-08-13 LAB — RAPID URINE DRUG SCREEN, HOSP PERFORMED
Amphetamines: NOT DETECTED
Barbiturates: NOT DETECTED
Benzodiazepines: NOT DETECTED
Cocaine: NOT DETECTED
Opiates: NOT DETECTED
Tetrahydrocannabinol: NOT DETECTED

## 2012-08-13 LAB — URINALYSIS, ROUTINE W REFLEX MICROSCOPIC
Bilirubin Urine: NEGATIVE
Glucose, UA: 500 mg/dL — AB
Hgb urine dipstick: NEGATIVE
Ketones, ur: NEGATIVE mg/dL
Leukocytes, UA: NEGATIVE
Nitrite: NEGATIVE
Specific Gravity, Urine: <1.005 — ABNORMAL LOW (ref 1.005–1.030)
Urobilinogen, UA: 0.2 mg/dL (ref 0.0–1.0)
pH: 6 (ref 5.0–8.0)

## 2012-08-13 LAB — LITHIUM LEVEL: Lithium Lvl: 0.26 mEq/L — ABNORMAL LOW (ref 0.80–1.40)

## 2012-08-13 LAB — URINE MICROSCOPIC-ADD ON

## 2012-08-13 LAB — GLUCOSE, CAPILLARY: Glucose-Capillary: 240 mg/dL — ABNORMAL HIGH (ref 70–99)

## 2012-08-13 LAB — ETHANOL: Alcohol, Ethyl (B): <11 mg/dL (ref 0–11)

## 2012-08-13 MED ORDER — METFORMIN HCL 500 MG PO TABS
ORAL_TABLET | ORAL | Status: AC
  Administered 2012-08-13: 500 mg
  Filled 2012-08-13: qty 1

## 2012-08-13 MED ORDER — OLANZAPINE 5 MG PO TABS
ORAL_TABLET | ORAL | Status: AC
  Filled 2012-08-13: qty 2

## 2012-08-13 MED ORDER — METFORMIN HCL ER 500 MG PO TB24
500.0000 mg | ORAL_TABLET | Freq: Two times a day (BID) | ORAL | Status: DC
  Administered 2012-08-14 – 2012-08-15 (×3): 500 mg via ORAL
  Filled 2012-08-13 (×6): qty 1

## 2012-08-13 MED ORDER — PALIPERIDONE PALMITATE 234 MG/1.5ML IM SUSP
234.0000 mg | INTRAMUSCULAR | Status: DC
  Administered 2012-08-15: 234 mg via INTRAMUSCULAR

## 2012-08-13 MED ORDER — BUSPIRONE HCL 5 MG PO TABS
ORAL_TABLET | ORAL | Status: AC
  Filled 2012-08-13: qty 2

## 2012-08-13 MED ORDER — ACETAMINOPHEN 325 MG PO TABS: 650.0000 mg | ORAL_TABLET | ORAL | Status: DC | PRN

## 2012-08-13 MED ORDER — BUSPIRONE HCL 5 MG PO TABS
10.0000 mg | ORAL_TABLET | Freq: Three times a day (TID) | ORAL | Status: DC
  Administered 2012-08-13 – 2012-08-15 (×5): 10 mg via ORAL
  Filled 2012-08-13: qty 2
  Filled 2012-08-13 (×4): qty 1
  Filled 2012-08-13 (×2): qty 2
  Filled 2012-08-13: qty 1

## 2012-08-13 MED ORDER — ALUM & MAG HYDROXIDE-SIMETH 200-200-20 MG/5ML PO SUSP: 30.0000 mL | ORAL | Status: DC | PRN

## 2012-08-13 MED ORDER — INSULIN ASPART PROT & ASPART (70-30 MIX) 100 UNIT/ML ~~LOC~~ SUSP
60.0000 [IU] | Freq: Every day | SUBCUTANEOUS | Status: DC
  Administered 2012-08-14 – 2012-08-15 (×2): 60 [IU] via SUBCUTANEOUS
  Filled 2012-08-13: qty 10

## 2012-08-13 MED ORDER — SODIUM CHLORIDE 0.9 % IV BOLUS (SEPSIS): 1000.0000 mL | Freq: Once | INTRAVENOUS | Status: DC

## 2012-08-13 MED ORDER — ALBUTEROL SULFATE HFA 108 (90 BASE) MCG/ACT IN AERS
2.0000 | INHALATION_SPRAY | Freq: Four times a day (QID) | RESPIRATORY_TRACT | Status: DC
  Administered 2012-08-14 – 2012-08-15 (×5): 2 via RESPIRATORY_TRACT
  Filled 2012-08-13 (×2): qty 6.7

## 2012-08-13 MED ORDER — GABAPENTIN 300 MG PO CAPS
ORAL_CAPSULE | ORAL | Status: AC
  Filled 2012-08-13: qty 1

## 2012-08-13 MED ORDER — METOPROLOL TARTRATE 25 MG PO TABS
12.5000 mg | ORAL_TABLET | Freq: Two times a day (BID) | ORAL | Status: DC
  Administered 2012-08-13 – 2012-08-15 (×4): 12.5 mg via ORAL
  Filled 2012-08-13 (×4): qty 1

## 2012-08-13 MED ORDER — BENZTROPINE MESYLATE 1 MG PO TABS
2.0000 mg | ORAL_TABLET | Freq: Two times a day (BID) | ORAL | Status: DC
  Administered 2012-08-13 – 2012-08-15 (×4): 2 mg via ORAL
  Filled 2012-08-13 (×4): qty 2

## 2012-08-13 MED ORDER — GABAPENTIN 300 MG PO CAPS
300.0000 mg | ORAL_CAPSULE | Freq: Every day | ORAL | Status: DC
  Administered 2012-08-13 – 2012-08-14 (×2): 300 mg via ORAL
  Filled 2012-08-13 (×3): qty 1

## 2012-08-13 MED ORDER — INSULIN ASPART PROT & ASPART (70-30 MIX) 100 UNIT/ML ~~LOC~~ SUSP
40.0000 [IU] | Freq: Every day | SUBCUTANEOUS | Status: DC
  Administered 2012-08-14: 40 [IU] via SUBCUTANEOUS
  Filled 2012-08-13: qty 10

## 2012-08-13 MED ORDER — INSULIN ASPART PROT & ASPART (70-30 MIX) 100 UNIT/ML ~~LOC~~ SUSP
SUBCUTANEOUS | Status: AC
  Filled 2012-08-13: qty 10

## 2012-08-13 MED ORDER — LITHIUM CARBONATE 300 MG PO TABS
300.0000 mg | ORAL_TABLET | Freq: Two times a day (BID) | ORAL | Status: DC
  Administered 2012-08-13: 300 mg via ORAL
  Filled 2012-08-13 (×4): qty 1

## 2012-08-13 MED ORDER — SIMVASTATIN 20 MG PO TABS
20.0000 mg | ORAL_TABLET | Freq: Every day | ORAL | Status: DC
  Administered 2012-08-14: 20 mg via ORAL
  Filled 2012-08-13 (×3): qty 1

## 2012-08-13 MED ORDER — LISINOPRIL 10 MG PO TABS
40.0000 mg | ORAL_TABLET | Freq: Every day | ORAL | Status: DC
  Administered 2012-08-14 – 2012-08-15 (×2): 40 mg via ORAL
  Filled 2012-08-13: qty 3
  Filled 2012-08-13: qty 1
  Filled 2012-08-13: qty 4

## 2012-08-13 MED ORDER — OLANZAPINE 5 MG PO TABS
10.0000 mg | ORAL_TABLET | Freq: Every day | ORAL | Status: DC
  Administered 2012-08-13 – 2012-08-14 (×2): 10 mg via ORAL
  Filled 2012-08-13: qty 1
  Filled 2012-08-13: qty 2
  Filled 2012-08-13: qty 1

## 2012-08-13 MED ORDER — LORAZEPAM 1 MG PO TABS
1.0000 mg | ORAL_TABLET | Freq: Three times a day (TID) | ORAL | Status: DC | PRN
  Administered 2012-08-13 – 2012-08-15 (×2): 1 mg via ORAL
  Filled 2012-08-13 (×2): qty 1

## 2012-08-13 MED ORDER — INSULIN ASPART PROT & ASPART (70-30 MIX) 100 UNIT/ML ~~LOC~~ SUSP: 40.0000 [IU] | Freq: Two times a day (BID) | SUBCUTANEOUS | Status: DC

## 2012-08-13 MED ORDER — ONDANSETRON HCL 4 MG PO TABS: 4.0000 mg | ORAL_TABLET | Freq: Three times a day (TID) | ORAL | Status: DC | PRN

## 2012-08-13 MED ORDER — LITHIUM CARBONATE 300 MG PO CAPS
ORAL_CAPSULE | ORAL | Status: AC
  Filled 2012-08-13: qty 1

## 2012-08-13 NOTE — ED Notes (Signed)
Pt arrived by [facility] w/ ivc papers in place.  Pt deneis any si/hi, papers state that she is not taking her meds, thinks someone is going to harm her children, and had 10-20 babies and can't find any of them

## 2012-08-13 NOTE — ED Notes (Signed)
Pt w/ rambling thoughts, speaking of the devil and how God will take control.  You all are killing me

## 2012-08-13 NOTE — ED Notes (Addendum)
Jerilynn Som with [facility] ACT team is to assess the patient tonight per Hattie Perch, [facility] team.  The pt is to be placed on list for [facility]  The patient is uncooperative at this point and will not change her clothing, officer at bedside.

## 2012-08-13 NOTE — ED Provider Notes (Signed)
History  This chart was scribed for Emily Razor, MD by Greggory Stallion, ED Scribe. This patient was seen in room APA16A/APA16A and the patient's care was started at 4:57 PM.  [name]: [id number]  Arrival date & time 08/13/12  1637    Chief Complaint  Patient presents with  . Medical Clearance    The history is provided by the patient. No language interpreter was used.    HPI Comments: Emily Cordova is a 42 y.o. female who presents to the Emergency Department for medical clearance. Pt states she was here two days ago. Emergency planning/management officer states she went from jail to [employer]. He states when she was at [facility] she started complaining of the symptoms that she was here for yesterday. Pt states she sees Jesus and her children. She states she could not fill her medications that she got prescriptions for the other day before she went to jail. Pt denies SI, HI, fever, neck pain, sore throat, visual disturbance, CP, cough, SOB, abdominal pain, nausea, emesis, diarrhea, urinary symptoms, back pain, HA, weakness, numbness and rash as associated symptoms.    Past Medical History  Diagnosis Date  . Schizophrenia   . Bipolar disorder   . Diabetes mellitus   . Hypercholesteremia   . Hypertension   . Kidney stone     Past Surgical History  Procedure Laterality Date  . Cholecystectomy    . Tubal ligation      No family history on file.  History  Substance Use Topics  . Smoking status: Current Every Day Smoker -- 1.00 packs/day  . Smokeless tobacco: Not on file  . Alcohol Use: No    OB History   Grav Para Term Preterm Abortions TAB SAB Ect Mult Living                  Review of Systems  Constitutional: Negative for fever.  HENT: Negative for sore throat and neck pain.   Eyes: Negative for visual disturbance.  Respiratory: Negative for cough and shortness of breath.   Cardiovascular: Negative for chest pain.  Gastrointestinal: Negative for nausea, vomiting, abdominal pain and diarrhea.   Genitourinary: Negative for dysuria.  Musculoskeletal: Negative for back pain.  Skin: Negative for rash.  Neurological: Negative for headaches.  Psychiatric/Behavioral: Positive for behavioral problems. Negative for suicidal ideas.  All other systems reviewed and are negative.    Allergies  Etomidate and Haldol  Home Medications   Current Outpatient Rx  Name  Route  Sig  Dispense  Refill  . albuterol (PROVENTIL HFA;VENTOLIN HFA) 108 (90 BASE) MCG/ACT inhaler   Inhalation   Inhale 2 puffs into the lungs 4 (four) times daily.         . Aspirin-Salicylamide-Caffeine (BC HEADACHE) 325-95-16 MG TABS   Oral   Take 1 packet by mouth daily as needed (for headache pain).         . benztropine (COGENTIN) 2 MG tablet   Oral   Take 2 mg by mouth 2 (two) times daily.         . busPIRone (BUSPAR) 10 MG tablet   Oral   Take 1 tablet (10 mg total) by mouth 3 (three) times daily.   90 tablet   0   . gabapentin (NEURONTIN) 300 MG capsule   Oral   Take 300 mg by mouth at bedtime.         Marland Kitchen guaiFENesin-codeine (ROBITUSSIN AC) 100-10 MG/5ML syrup   Oral   Take 10 mLs by mouth  3 (three) times daily as needed for cough (Take 2 teaspoons every 4 hours if needed for cough.).   120 mL   0   . insulin aspart protamine-insulin aspart (NOVOLOG 70/30) (70-30) 100 UNIT/ML injection   Subcutaneous   Inject 40-60 Units into the skin 2 (two) times daily. 60 units in the AM and 40 units at night         . lisinopril (PRINIVIL,ZESTRIL) 40 MG tablet   Oral   Take 40 mg by mouth daily.         Marland Kitchen lithium 300 MG tablet   Oral   Take 300 mg by mouth 2 (two) times daily.         . metFORMIN (GLUCOPHAGE-XR) 500 MG 24 hr tablet   Oral   Take 1,000 mg by mouth 2 (two) times daily.         . metoprolol tartrate (LOPRESSOR) 25 MG tablet   Oral   Take 12.5 mg by mouth 2 (two) times daily.         Marland Kitchen OLANZapine (ZYPREXA) 10 MG tablet   Oral   Take 10 mg by mouth at bedtime.          . Paliperidone Palmitate (INVEGA SUSTENNA) 234 MG/1.5ML SUSP   Intramuscular   Inject 234 mg into the muscle every 28 (twenty-eight) days.          . simvastatin (ZOCOR) 20 MG tablet   Oral   Take 20 mg by mouth at bedtime.           There were no vitals taken for this visit.  Physical Exam  Nursing note and vitals reviewed. Constitutional: She appears well-developed and well-nourished. No distress.  HENT:  Head: Normocephalic and atraumatic.  Right Ear: External ear normal.  Left Ear: External ear normal.  Eyes: Conjunctivae are normal. Right eye exhibits no discharge. Left eye exhibits no discharge. No scleral icterus.  Neck: Neck supple. No tracheal deviation present.  Cardiovascular: Normal rate, regular rhythm and intact distal pulses.   Pulmonary/Chest: Effort normal and breath sounds normal. No stridor. No respiratory distress. She has no wheezes. She has no rales.  Abdominal: Soft. Bowel sounds are normal. She exhibits no distension. There is no tenderness. There is no rebound and no guarding.  Musculoskeletal: She exhibits no edema and no tenderness.  Neurological: She is alert. She has normal strength. No sensory deficit. Cranial nerve deficit:  no gross defecits noted. She exhibits normal muscle tone. She displays no seizure activity. Coordination normal.  Skin: Skin is warm and dry. No rash noted.  Psychiatric: She has a normal mood and affect.  Perseverating on Jesus. Poor attention span. Needs frequent redirection. Seems to respond to internal stimuli.     ED Course  Procedures (including critical care time)  DIAGNOSTIC STUDIES:  COORDINATION OF CARE: 5:24 PM-Discussed treatment plan with pt at bedside and pt agreed to plan.   Labs Reviewed - No data to display No results found.   1. Bipolar disorder       MDM  42yF with psychiatric history and currently with delusional/paranoid behavior. I do not feel she is currently psychiatrically stable for  DC. ACT eval.     I personally preformed the services scribed in my presence. The recorded information has been reviewed is accurate. Emily Razor, MD.    Emily Razor, MD 08/20/12 [phone]

## 2012-08-14 ENCOUNTER — Emergency Department (HOSPITAL_COMMUNITY): Payer: MEDICAID

## 2012-08-14 LAB — GLUCOSE, CAPILLARY
Glucose-Capillary: 168 mg/dL — ABNORMAL HIGH (ref 70–99)
Glucose-Capillary: 186 mg/dL — ABNORMAL HIGH (ref 70–99)
Glucose-Capillary: 94 mg/dL (ref 70–99)
Glucose-Capillary: 91 mg/dL (ref 70–99)

## 2012-08-14 MED ORDER — LITHIUM CARBONATE 300 MG PO CAPS
300.0000 mg | ORAL_CAPSULE | Freq: Two times a day (BID) | ORAL | Status: DC
  Administered 2012-08-14 – 2012-08-15 (×3): 300 mg via ORAL
  Filled 2012-08-14 (×5): qty 1

## 2012-08-14 MED ORDER — RABIES IMMUNE GLOBULIN 150 UNIT/ML IM INJ
INJECTION | INTRAMUSCULAR | Status: AC
  Filled 2012-08-14: qty 2

## 2012-08-14 MED FILL — Lithium Carbonate Cap 300 MG: ORAL | Qty: 1 | Status: AC

## 2012-08-14 NOTE — ED Notes (Signed)
Patient up to restroom tolerated well with officer. Patient request bath this morning if possible. Patient very thankful for all our help.

## 2012-08-14 NOTE — ED Notes (Signed)
Pt now on waiting list at [facility]. [place] from [place] in to talk with pt. No changes.

## 2012-08-14 NOTE — BH Assessment (Addendum)
Assessment Note   Emily Cordova is an 42 y.o. female. The patient was brought to the ED by law enforcement, after a petition was completed by [name] staff. The patient is rapidly decompensating after she stopped her medications about a week ago. She was released from [facility] less than a month ago. She has  Been followed by the [facility] and it was that staff who filed th petition. She is paranoid, thinking someone is going to hurt her daughter. She hs told staff that she has 10-20 babies  And she cannot find them.  Also God keeps telling her that her daughters are doing drugs. Due to the patient history of aggression and violence, she cannot return to any local psychiatric hospitals and the [facility] staff has obtained authorizations to refer the patient to [facility]. Additionally she was on a 180 day outpatient commitment. Spoke with [employer] staff and they are still needing some information. contacted Apolinar Junes at [facility], he will contact [facility] and provide needed information.  Informed Dr Estell Harpin of the patient's current status.  Axis I: Chronic Paranoid Schizophrenia Axis II: Deferred Axis III:  Past Medical History  Diagnosis Date  . Schizophrenia   . Bipolar disorder   . Diabetes mellitus   . Hypercholesteremia   . Hypertension   . Kidney stone    Axis IV: other psychosocial or environmental problems, problems related to legal system/crime, problems related to social environment, problems with access to health care services and problems with primary support group Axis V: 21-30 behavior considerably influenced by delusions or hallucinations OR serious impairment in judgment, communication OR inability to function in almost all areas  Past Medical History:  Past Medical History  Diagnosis Date  . Schizophrenia   . Bipolar disorder   . Diabetes mellitus   . Hypercholesteremia   . Hypertension   . Kidney stone     Past Surgical History  Procedure Laterality Date  . Cholecystectomy    .  Tubal ligation      Family History: No family history on file.  Social History:  reports that she has been smoking.  She does not have any smokeless tobacco history on file. She reports that she does not drink alcohol or use illicit drugs.  Additional Social History:     CIWA: CIWA-Ar BP: 145/99 mmHg Pulse Rate: 76 COWS:    Allergies:  Allergies  Allergen Reactions  . Etomidate Other (See Comments)    Severe Myoclonus: SE  . Haldol (Haloperidol Decanoate)     Home Medications:  (Not in a hospital admission)  OB/GYN Status:  No LMP recorded. Patient is not currently having periods (Reason: Other).  General Assessment Data Location of Assessment: [facility] ACT Assessment: Yes Living Arrangements: Children Can pt return to current living arrangement?: Yes Admission Status: Involuntary Is patient capable of signing voluntary admission?: No Transfer from: Acute Hospital Referral Source: MD  Education Status Is patient currently in school?: No  Risk to self Suicidal Ideation: No Suicidal Intent: No Is patient at risk for suicide?: Yes Suicidal Plan?: No Access to Means: No What has been your use of drugs/alcohol within the last 12 months?: none Previous Attempts/Gestures: Yes How many times?:  (unknown) Other Self Harm Risks: stops medications, wanders, delusional Triggers for Past Attempts: Other (Comment) (delusional thinking, paranoia) Intentional Self Injurious Behavior: None Family Suicide History: No Recent stressful life event(s): Turmoil (Comment);Conflict (Comment) Persecutory voices/beliefs?: Yes Depression: Yes Depression Symptoms: Insomnia;Isolating;Feeling angry/irritable Substance abuse history and/or treatment  for substance abuse?: No Suicide prevention information given to non-admitted patients: Not applicable  Risk to Others Homicidal Ideation: No Thoughts of Harm to Others: No Current Homicidal Intent: No Current Homicidal Plan: No Access to  Homicidal Means: No History of harm to others?: Yes Assessment of Violence: In distant past Violent Behavior Description: patient becomes combative if delusional thoughts continue Does patient have access to weapons?: No Criminal Charges Pending?: No Does patient have a court date: No (was released from jail on tresspass charges to seek treatmen)  Psychosis Hallucinations: None noted Delusions: Persecutory;Unspecified (paranoid thoughts about her children)  Mental Status Report Appear/Hygiene: Disheveled Eye Contact: Fair Motor Activity: Freedom of movement Speech: Rapid Level of Consciousness: Alert;Irritable;Restless Mood: Suspicious;Irritable Affect: Blunted Anxiety Level: None Thought Processes: Circumstantial (paranoid thoughts ) Judgement: Impaired Orientation: Person;Place;Time Obsessive Compulsive Thoughts/Behaviors: Moderate  Cognitive Functioning Concentration: Decreased Memory: Recent Impaired;Remote Impaired IQ: Average Insight: Poor Impulse Control: Poor Appetite: Good Sleep: Decreased Total Hours of Sleep:  (wanders at night) Vegetative Symptoms: Decreased grooming  ADLScreening [facility] Assessment Services) Patient's cognitive ability adequate to safely complete daily activities?: No Patient able to express need for assistance with ADLs?: Yes Independently performs ADLs?: Yes (appropriate for developmental age)  Abuse/Neglect [facility]) Physical Abuse: Denies Verbal Abuse: Denies Sexual Abuse: Denies  Prior Inpatient Therapy Prior Inpatient Therapy: Yes Prior Therapy Dates: May 2014 Prior Therapy Facilty/Provider(s): [facility] Reason for Treatment: psychosis, not taking medictions  Prior Outpatient Therapy Prior Outpatient Therapy: Yes Prior Therapy Dates: current Prior Therapy Facilty/Provider(s): [facility] Reason for Treatment: medictions  ADL Screening (condition at time of admission) Patient's cognitive ability adequate to safely complete  daily activities?: No Patient able to express need for assistance with ADLs?: Yes Independently performs ADLs?: Yes (appropriate for developmental age)       Abuse/Neglect Assessment (Assessment to be complete while patient is alone) Physical Abuse: Denies Verbal Abuse: Denies Sexual Abuse: Denies Values / Beliefs Cultural Requests During Hospitalization: None Spiritual Requests During Hospitalization: None        Additional Information 1:1 In Past 12 Months?: No CIRT Risk: Yes Elopement Risk: No Does patient have medical clearance?: Yes     Disposition: Patient is now on the wait list at [facility]. Junious Dresser ( RN ) in admissions called to relate this. She gave no expected admission date. Disposition Initial Assessment Completed for this Encounter: Yes Disposition of Patient: Inpatient treatment program Type of inpatient treatment program: Adult  On Site Evaluation by:   Reviewed with Physician:     Jearld Pies 08/14/2012 9:26 AM

## 2012-08-14 NOTE — ED Notes (Signed)
Pt alert and cooperative at this time.  No complaints verbalized.  [facility] at bedside.  No distress noted.

## 2012-08-14 NOTE — ED Notes (Signed)
Took pt cbg. cbg was 94. Reported to nurse. Took pts vitals. Pt tolerated well.

## 2012-08-14 NOTE — ED Notes (Signed)
Emily Cordova from [facility] called and stated that pt is on the waiting list.

## 2012-08-15 LAB — GLUCOSE, CAPILLARY: Glucose-Capillary: 90 mg/dL (ref 70–99)

## 2012-08-15 NOTE — ED Provider Notes (Signed)
Patient has been seen by her psychiatrist Dr. Hassie Bruce from [facility] who feels patient can be discharged. He is not think she is a threat to herself or anyone else. He will be adjusting her lithium dose. IVC paperwork has been rescinded by Dr. Hassie Bruce.  BP 136/104  Pulse 70  Temp(Src) 98.1 F (36.7 C) (Oral)  Resp 20  SpO2 98%   Glynn Octave, MD 08/15/12 1616

## 2012-08-15 NOTE — ED Notes (Signed)
[facility] called to see if patient still needed to be on the wait list. Confirmed that patient still needed placement.

## 2012-08-15 NOTE — ED Notes (Signed)
Calvin with [facility] and DR Hassie Bruce in to see patient at this time. Patient speaking with them.

## 2012-08-15 NOTE — ED Notes (Signed)
Patient with eyes closed. No distress. Respirations even and unlabored.

## 2012-08-15 NOTE — ED Notes (Signed)
Patient eating breakfast. No distress. Will medicate with daily medications when she has finished her breakfast.

## 2012-08-15 NOTE — ED Notes (Signed)
Patient with no complaints at this time. Respirations even and unlabored. Skin warm/dry. Discharge instructions reviewed with patient at this time. Patient given opportunity to voice concerns/ask questions. Patient discharged at this time and left Emergency Department with steady gait. Left with [facility] staff

## 2012-09-19 ENCOUNTER — Telehealth (HOSPITAL_COMMUNITY): Payer: Self-pay | Admitting: Cardiovascular Disease

## 2012-09-29 IMAGING — US US BREAST*R*
1 series · 1 of 1 positions shown · non-contrast
Comparison: None.

CLINICAL DATA: The patient returns for evaluation of a possible
mass in the right subareolar region noted on recent screening study
dated 03/30/2011.

DIGITAL DIAGNOSTIC RIGHT MAMMOGRAM WITH CAD AND RIGHT BREAST
ULTRASOUND:

[Series 1: us breast*right* · 0.10mm/px · 1 of 1 slices shown]
[im 1/1]
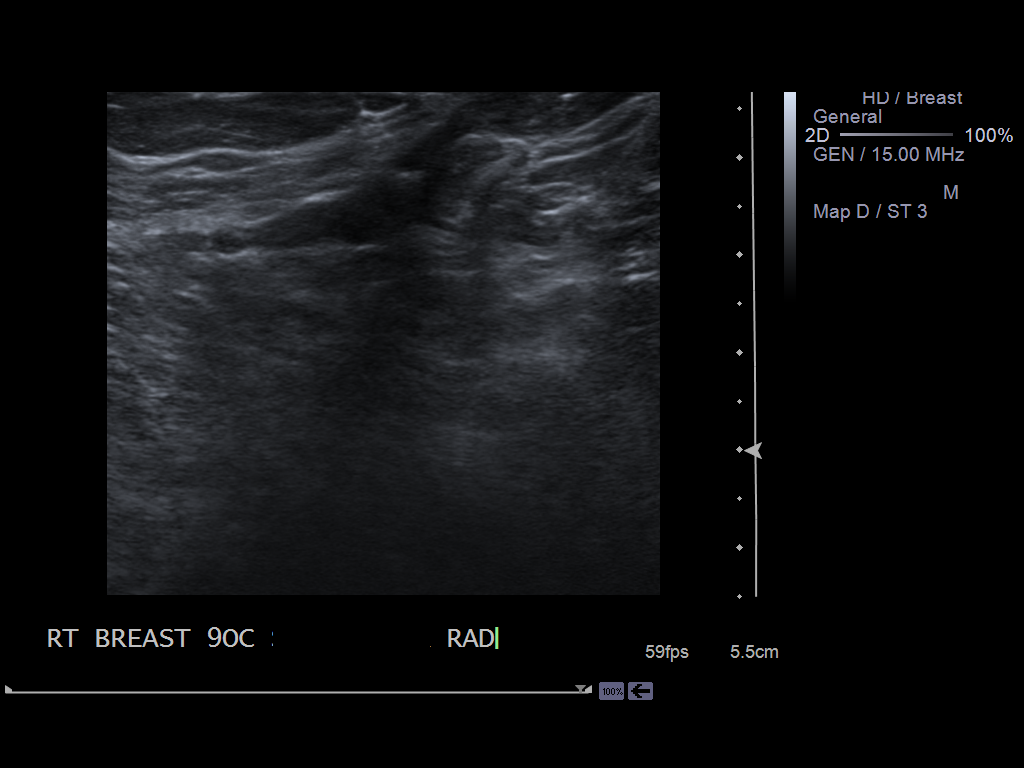

[1 of 1 positions shown; findings below may reference images not displayed]

FINDINGS: Additional views demonstrate no persistent worrisome
mass in the right subareolar region.  There are prominent
subareolar ducts.
Mammographic images were processed with CAD.

On physical exam, no mass is palpated in the right subareolar
region.

Ultrasound is performed, showing a subareolar duct at 9 o'clock
with no intraductal mass.
IMPRESSION: No mammographic or sonographic evidence of malignancy.  Yearly
screening mammography is suggested.

BI-RADS CATEGORY 1:  Negative.

## 2012-10-14 ENCOUNTER — Telehealth (HOSPITAL_COMMUNITY): Payer: Self-pay | Admitting: Cardiovascular Disease

## 2012-10-14 NOTE — Telephone Encounter (Signed)
  [facility] LOCATED AT [facility] [address] [place], [place] [address] [phone]   Date:10/14/2012  Dear Dr. Royann Shivers  Our office has attempted to contact your patient Emily Cordova (30-Aug-1970)  twice by telephone and we have also sent an appointment letter to schedule the Echocardiogram annd Nuclear Stress test you ordered. The patient has not responded. We will not make any further attempts to contact the patient. If any further assistance is needed for this referral, please contact our office at [phone]  Sincerely, [facility] Cardiovascular Imaging Scheduling Team

## 2012-10-15 NOTE — Telephone Encounter (Signed)
Per scheduling unable to reach patient to schedule the Echo, so it was cancelled.

## 2012-11-20 ENCOUNTER — Emergency Department (HOSPITAL_COMMUNITY): Payer: Medicaid Other

## 2012-11-20 ENCOUNTER — Emergency Department (HOSPITAL_COMMUNITY)
Admission: EM | Admit: 2012-11-20 | Discharge: 2012-11-20 | Disposition: A | Discharge status: Home / Self Care | Payer: Medicaid Other | Attending: Orthopaedic Surgery | Admitting: Orthopaedic Surgery

## 2012-11-20 ENCOUNTER — Encounter (HOSPITAL_COMMUNITY): Payer: Self-pay | Admitting: *Deleted

## 2012-11-20 DIAGNOSIS — Z87442 Personal history of urinary calculi: Secondary | ICD-10-CM | POA: Insufficient documentation

## 2012-11-20 DIAGNOSIS — Z79899 Other long term (current) drug therapy: Secondary | ICD-10-CM | POA: Insufficient documentation

## 2012-11-20 DIAGNOSIS — S92911B Unspecified fracture of right toe(s), initial encounter for open fracture: Secondary | ICD-10-CM

## 2012-11-20 DIAGNOSIS — S92919A Unspecified fracture of unspecified toe(s), initial encounter for closed fracture: Secondary | ICD-10-CM | POA: Insufficient documentation

## 2012-11-20 DIAGNOSIS — Y9301 Activity, walking, marching and hiking: Secondary | ICD-10-CM | POA: Insufficient documentation

## 2012-11-20 DIAGNOSIS — F209 Schizophrenia, unspecified: Secondary | ICD-10-CM | POA: Insufficient documentation

## 2012-11-20 DIAGNOSIS — Z7982 Long term (current) use of aspirin: Secondary | ICD-10-CM | POA: Insufficient documentation

## 2012-11-20 DIAGNOSIS — F319 Bipolar disorder, unspecified: Secondary | ICD-10-CM | POA: Insufficient documentation

## 2012-11-20 DIAGNOSIS — E7800 Pure hypercholesterolemia, unspecified: Secondary | ICD-10-CM | POA: Insufficient documentation

## 2012-11-20 DIAGNOSIS — W268XXA Contact with other sharp object(s), not elsewhere classified, initial encounter: Secondary | ICD-10-CM | POA: Insufficient documentation

## 2012-11-20 DIAGNOSIS — I10 Essential (primary) hypertension: Secondary | ICD-10-CM | POA: Insufficient documentation

## 2012-11-20 DIAGNOSIS — F17200 Nicotine dependence, unspecified, uncomplicated: Secondary | ICD-10-CM | POA: Insufficient documentation

## 2012-11-20 DIAGNOSIS — Z794 Long term (current) use of insulin: Secondary | ICD-10-CM | POA: Insufficient documentation

## 2012-11-20 DIAGNOSIS — Y9289 Other specified places as the place of occurrence of the external cause: Secondary | ICD-10-CM | POA: Insufficient documentation

## 2012-11-20 DIAGNOSIS — E119 Type 2 diabetes mellitus without complications: Secondary | ICD-10-CM | POA: Insufficient documentation

## 2012-11-20 MED ORDER — LIDOCAINE HCL (PF) 1 % IJ SOLN
INTRAMUSCULAR | Status: AC
  Filled 2012-11-20: qty 10

## 2012-11-20 MED ORDER — BENZTROPINE MESYLATE 1 MG PO TABS: 2.0000 mg | ORAL_TABLET | Freq: Once | ORAL | Status: DC

## 2012-11-20 MED ORDER — METFORMIN HCL 500 MG PO TABS: 1000.0000 mg | ORAL_TABLET | Freq: Once | ORAL | Status: DC

## 2012-11-20 MED ORDER — LIDOCAINE HCL (PF) 1 % IJ SOLN
INTRAMUSCULAR | Status: AC
  Filled 2012-11-20: qty 15

## 2012-11-20 MED ORDER — BUSPIRONE HCL 15 MG PO TABS
15.0000 mg | ORAL_TABLET | Freq: Once | ORAL | Status: DC
  Filled 2012-11-20: qty 1

## 2012-11-20 MED ORDER — LITHIUM CARBONATE 300 MG PO CAPS
600.0000 mg | ORAL_CAPSULE | Freq: Once | ORAL | Status: DC
  Filled 2012-11-20: qty 2

## 2012-11-20 NOTE — ED Provider Notes (Signed)
[name]: [id number]     Arrival date & time 11/20/12  7829 History   First MD Initiated Contact with Patient 11/20/12 [phone]     Chief Complaint  Patient presents with  . Laceration   (Consider location/radiation/quality/duration/timing/severity/associated sxs/prior Treatment) Patient is a 42 y.o. female presenting with skin laceration. The history is provided by the patient.  Laceration Location:  Foot Foot laceration location:  R toes Length (cm):  2 cm Depth:  Through underlying tissue Quality: straight   Bleeding: controlled   Laceration mechanism:  Unable to specify Pain details:    Quality:  Sharp and throbbing   Severity:  Moderate   Timing:  Constant   Progression:  Unchanged Foreign body present:  Unable to specify Relieved by:  Nothing Worsened by:  Movement and pressure Ineffective treatments:  None tried Tetanus status:  Up to date  Emily Cordova is a 42 y.o. female who presents to the ED with a laceration to the right foot. The injury occurred this morning when she walked outside barefooted and stepped on something that cut her foot. She has no idea what it was. She complains of pain and bleeding under the fourth and fifth toes.   Past Medical History  Diagnosis Date  . Schizophrenia   . Bipolar disorder   . Diabetes mellitus   . Hypercholesteremia   . Hypertension   . Kidney stone    Past Surgical History  Procedure Laterality Date  . Cholecystectomy    . Tubal ligation     No family history on file. History  Substance Use Topics  . Smoking status: Current Every Day Smoker -- 1.00 packs/day  . Smokeless tobacco: Not on file  . Alcohol Use: No   OB History   Grav Para Term Preterm Abortions TAB SAB Ect Mult Living                 Review of Systems  Constitutional: Negative for fever and chills.  HENT: Negative for neck pain.   Eyes: Negative for visual disturbance.  Respiratory: Negative for shortness of breath.   Cardiovascular: Negative for  chest pain.  Musculoskeletal:       Lacerations toes of right foot.  Skin: Positive for wound.  Neurological: Negative for headaches.  Psychiatric/Behavioral: The patient is nervous/anxious.     Allergies  Etomidate and Haldol  Home Medications   Current Outpatient Rx  Name  Route  Sig  Dispense  Refill  . albuterol (PROVENTIL HFA;VENTOLIN HFA) 108 (90 BASE) MCG/ACT inhaler   Inhalation   Inhale 2 puffs into the lungs 4 (four) times daily.         . Aspirin-Salicylamide-Caffeine (BC HEADACHE) 325-95-16 MG TABS   Oral   Take 1 packet by mouth daily as needed (for headache pain).         . benztropine (COGENTIN) 2 MG tablet   Oral   Take 2 mg by mouth 2 (two) times daily.         . busPIRone (BUSPAR) 10 MG tablet   Oral   Take 15 mg by mouth 2 (two) times daily.         Marland Kitchen gabapentin (NEURONTIN) 300 MG capsule   Oral   Take 300 mg by mouth at bedtime.         . insulin aspart protamine-insulin aspart (NOVOLOG 70/30) (70-30) 100 UNIT/ML injection   Subcutaneous   Inject 40-60 Units into the skin 2 (two) times daily. 60 units in the AM  and 40 units at night         . lisinopril (PRINIVIL,ZESTRIL) 40 MG tablet   Oral   Take 40 mg by mouth daily.         Marland Kitchen lithium 300 MG tablet   Oral   Take 300 mg by mouth 2 (two) times daily.         . metFORMIN (GLUCOPHAGE-XR) 500 MG 24 hr tablet   Oral   Take 500 mg by mouth 2 (two) times daily.          . metoprolol tartrate (LOPRESSOR) 25 MG tablet   Oral   Take 12.5 mg by mouth 2 (two) times daily.         Marland Kitchen OLANZapine (ZYPREXA) 10 MG tablet   Oral   Take 10 mg by mouth at bedtime.         . simvastatin (ZOCOR) 20 MG tablet   Oral   Take 20 mg by mouth at bedtime.         . traZODone (DESYREL) 100 MG tablet   Oral   Take 100 mg by mouth at bedtime.         . Paliperidone Palmitate (INVEGA SUSTENNA) 234 MG/1.5ML SUSP   Intramuscular   Inject 234 mg into the muscle every 28 (twenty-eight)  days.           BP 193/106  Pulse 83  Temp(Src) 98.5 F (36.9 C) (Oral)  Resp 18  SpO2 97% Physical Exam  Nursing note and vitals reviewed. Constitutional: She is oriented to person, place, and time. She appears well-developed and well-nourished. No distress.  HENT:  Head: Normocephalic and atraumatic.  Eyes: EOM are normal.  Neck: Normal range of motion. Neck supple.  Cardiovascular: Normal rate.   Pulmonary/Chest: Effort normal.  Musculoskeletal: Normal range of motion.       Right foot: She exhibits tenderness and laceration. She exhibits normal range of motion, no swelling and normal capillary refill.  Laceration to the base of the fourth and fifth toes plantar aspect. Tender with palpation and range of motion. Pedal pulse strong.   Neurological: She is alert and oriented to person, place, and time. No cranial nerve deficit.  Skin: Skin is warm and dry.  Psychiatric: She has a normal mood and affect. Her behavior is normal.    ED Course  Procedures (including critical care time) Labs Review Labs Reviewed - No data to display Imaging Review Dg Foot Complete Left  11/20/2012   CLINICAL DATA:  Laceration to the bottom of foot near the 4th digit. Question foreign body. Initial encounter.  EXAM: LEFT FOOT - COMPLETE 3+ VIEW  COMPARISON:  None.  FINDINGS: A minimally displaced fracture is evident base of the proximal phalanx in the 4th digit. No radiopaque foreign body is evident. A prominent plantar calcaneal spur is noted.  IMPRESSION: 1. Minimally displaced fracture at the base the proximal phalanx in the 4th digit. 2. No radiopaque foreign body. 3. Prominent plantar calcaneal spur.   Electronically Signed   By: Gennette Pac   On: 11/20/2012 10:36    MDM: Consult with Dr. Hilda Lias and he will be in to see the patient   42 y.o. female with open fracture of the the fourth toe right foot.     [facility] Orlene Och, [place] 11/20/12 1254

## 2012-11-20 NOTE — ED Notes (Signed)
Dr. Hilda Lias gave pt 2 hand written prescriptions - one for Keflex 500mg , one pill PO qid x 10 days, and the other for Norco 7.5/325mg  one pill q4hr PO PRN pain.

## 2012-11-20 NOTE — ED Notes (Signed)
Laceration to bottom of left foot which occurred this morning. Unsure of what she cut her foot on. Lac to 4th and 5th digits of left foot. States tetanus is UTD within past 5 years.

## 2012-11-20 NOTE — Consult Note (Signed)
Reason for Consult:laceration and fracture of the fourth toe proximal phalanx, open Referring Physician: ER  Emily Cordova is an 42 y.o. female.  HPI: She was walking barefooted when she stepped on something just outside her house and cut her left foot.  She is a diabetic.  She had bleeding.  X-rays here show nondisplaced fracture of the fourth toe proximal phalanx.  She has laceration over the same area of the fourth toe and also the fifth toe on the plantar surface.  She has no other injuries.  She is very emotional.  Past Medical History  Diagnosis Date  . Schizophrenia   . Bipolar disorder   . Diabetes mellitus   . Hypercholesteremia   . Hypertension   . Kidney stone     Past Surgical History  Procedure Laterality Date  . Cholecystectomy    . Tubal ligation      No family history on file.  Social History:  reports that she has been smoking.  She does not have any smokeless tobacco history on file. She reports that she does not drink alcohol or use illicit drugs.  Allergies:  Allergies  Allergen Reactions  . Etomidate Other (See Comments)    Severe Myoclonus: SE  . Haldol [Haloperidol Decanoate]     Medications: I have reviewed the patient's current medications.  Results for orders placed during the hospital encounter of 11/20/12 (from the past 48 hour(s))  GLUCOSE, CAPILLARY     Status: Abnormal   Collection Time    11/20/12 12:08 PM      Result Value Range   Glucose-Capillary 201 (*) 70 - 99 mg/dL    Dg Foot Complete Left  11/20/2012   CLINICAL DATA:  Laceration to the bottom of foot near the 4th digit. Question foreign body. Initial encounter.  EXAM: LEFT FOOT - COMPLETE 3+ VIEW  COMPARISON:  None.  FINDINGS: A minimally displaced fracture is evident base of the proximal phalanx in the 4th digit. No radiopaque foreign body is evident. A prominent plantar calcaneal spur is noted.  IMPRESSION: 1. Minimally displaced fracture at the base the proximal phalanx in the  4th digit. 2. No radiopaque foreign body. 3. Prominent plantar calcaneal spur.   Electronically Signed   By: Gennette Pac   On: 11/20/2012 10:36    Review of Systems  Musculoskeletal: Positive for joint pain (She cut the foot on the left at the base of the fourth toe and fifth toe plantar side earlier today.).  Psychiatric/Behavioral:       History of bipolar disorder   Blood pressure 108/105, pulse 77, temperature 98.5 F (36.9 C), temperature source Oral, resp. rate 18, SpO2 94.00%. Physical Exam  Constitutional: She is oriented to person, place, and time. She appears well-developed and well-nourished.  HENT:  Head: Normocephalic and atraumatic.  Eyes: Conjunctivae and EOM are normal. Pupils are equal, round, and reactive to light.  Neck: Normal range of motion. Neck supple.  Cardiovascular: Normal rate, regular rhythm and intact distal pulses.   Respiratory: Effort normal.  GI: Soft.  Musculoskeletal: She exhibits tenderness (She has bleeding and pain of the left foot on the plantar surface with lacertion of the plantar base of the fourth and fifth toes, no tendon injury.  NV is intact.).       Feet:  Neurological: She is alert and oriented to person, place, and time. She has normal reflexes.  Skin: Skin is warm and dry.  Psychiatric: She has a normal mood and affect.  Her behavior is normal. Judgment and thought content normal.    Assessment/Plan: Laceration of the plantar surface at the base of the fourth and fifth toes with fracture of the base of the proximal phalanx, nondisplaced, of the fourth toe, thus an open fracture.  In the ER I cleansed the wound after she had soaked it in betatine and peroxide for a period of time.  I then used 1% Xylocaine as a local block.  Once she had anesthesia, I cleansed the wound again and used 3-0 nylon suture to close the wounds loosely of the fourth and fifth toes.  A sterile dressing was applied.  An Ace bandage was applied loosely.  She  is to elevate and use the walker she has at the home.  A post op shoe was given.  Rx for Norco 7.5 and Keflex 500 given.  I will see her in my office on Monday morning. She is to return to the ER if any problem.  She and her daughter appeared to understand.   Emily Cordova 11/20/2012, 3:53 PM

## 2012-11-21 NOTE — ED Provider Notes (Signed)
Medical screening examination/treatment/procedure(s) were conducted as a shared visit with non-physician practitioner(s) and myself.  I personally evaluated the patient during the encounter.   Please see my separate note.     Candyce Churn, MD 11/21/12 [phone]

## 2012-11-21 NOTE — ED Provider Notes (Signed)
Medical screening examination/treatment/procedure(s) were conducted as a shared visit with non-physician practitioner(s) and myself.  I personally evaluated the patient during the encounter  42 year old female with lacerations to the plantar surface of her left fourth and fifth digits. X-ray demonstrates fracture of the proximal phalanx of the fourth digit. Exam reveals lacerations to left fourth and fifth digits on the plantar crease. Tendon function appears intact, but limited somewhat by pain.  Hemostatic. Orthopedics consulted and repaired lac in ED.  Clinical Impression: 1. Fracture, toe, right, open, initial encounter       Candyce Churn, MD 11/21/12 [phone]

## 2013-04-11 IMAGING — CR DG CHEST 1V PORT
1 series · 1 of 1 positions shown · non-contrast
Comparison: 10/22/2011

CLINICAL DATA: Endotracheal tube placement.

PORTABLE CHEST - 1 VIEW

[AP]
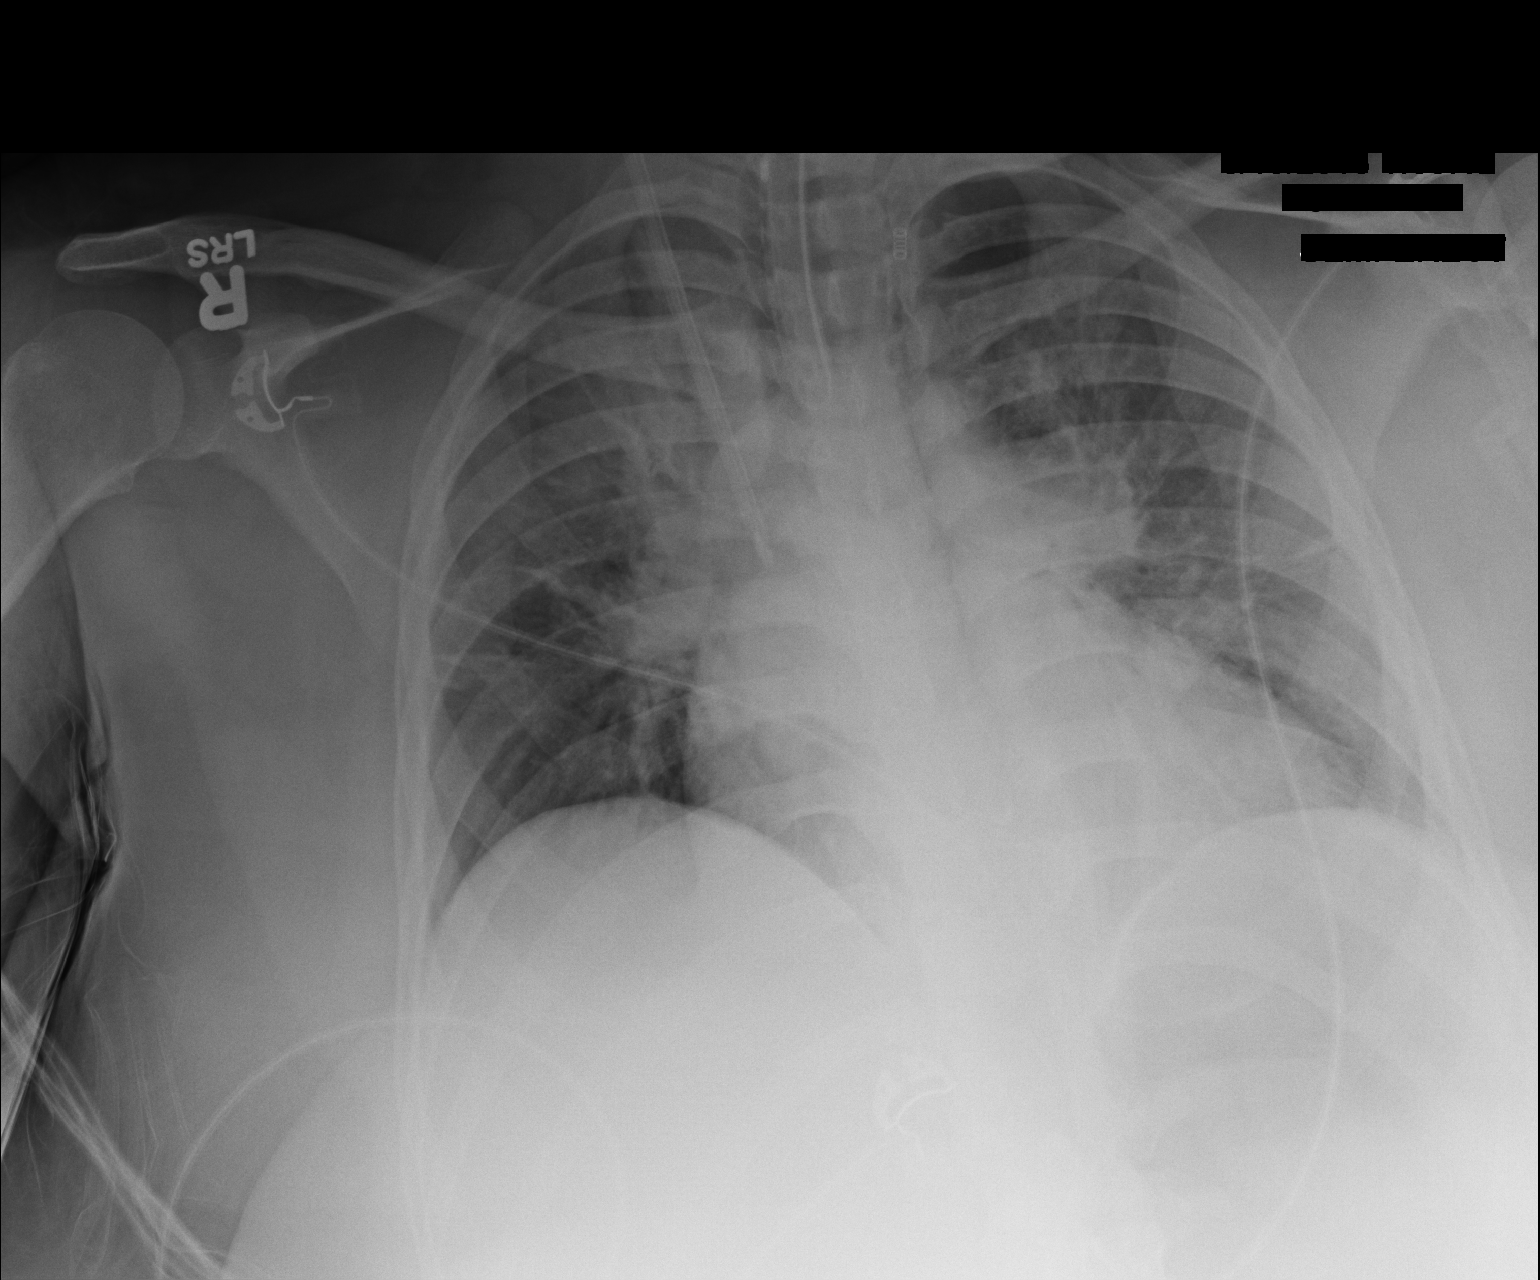

[1 of 1 positions shown; findings below may reference images not displayed]

FINDINGS: Shallow inspiration.  Mild cardiac enlargement with
increasing pulmonary vascular congestion since previous study.
Linear atelectasis in the mid lungs.  There has been interval
placement of an endotracheal tube with tip about 2.7 cm above the
carina.  A right central venous catheter has been placed with the
tip projected over the mid SVC.  No pneumothorax.  No blunting of
costophrenic angles.
IMPRESSION: Developing pulmonary vascular congestion and linear atelectasis in
the lungs.  Endotracheal tube tip is about 2.7 cm above the carina.
Right central venous catheter tip is projected over the mid SVC.

## 2013-04-12 IMAGING — CR DG CHEST 1V PORT
1 series · 1 of 1 positions shown · non-contrast
Comparison: 10/23/2011

CLINICAL DATA: Endotracheal tube placement.

PORTABLE CHEST - 1 VIEW

[AP]
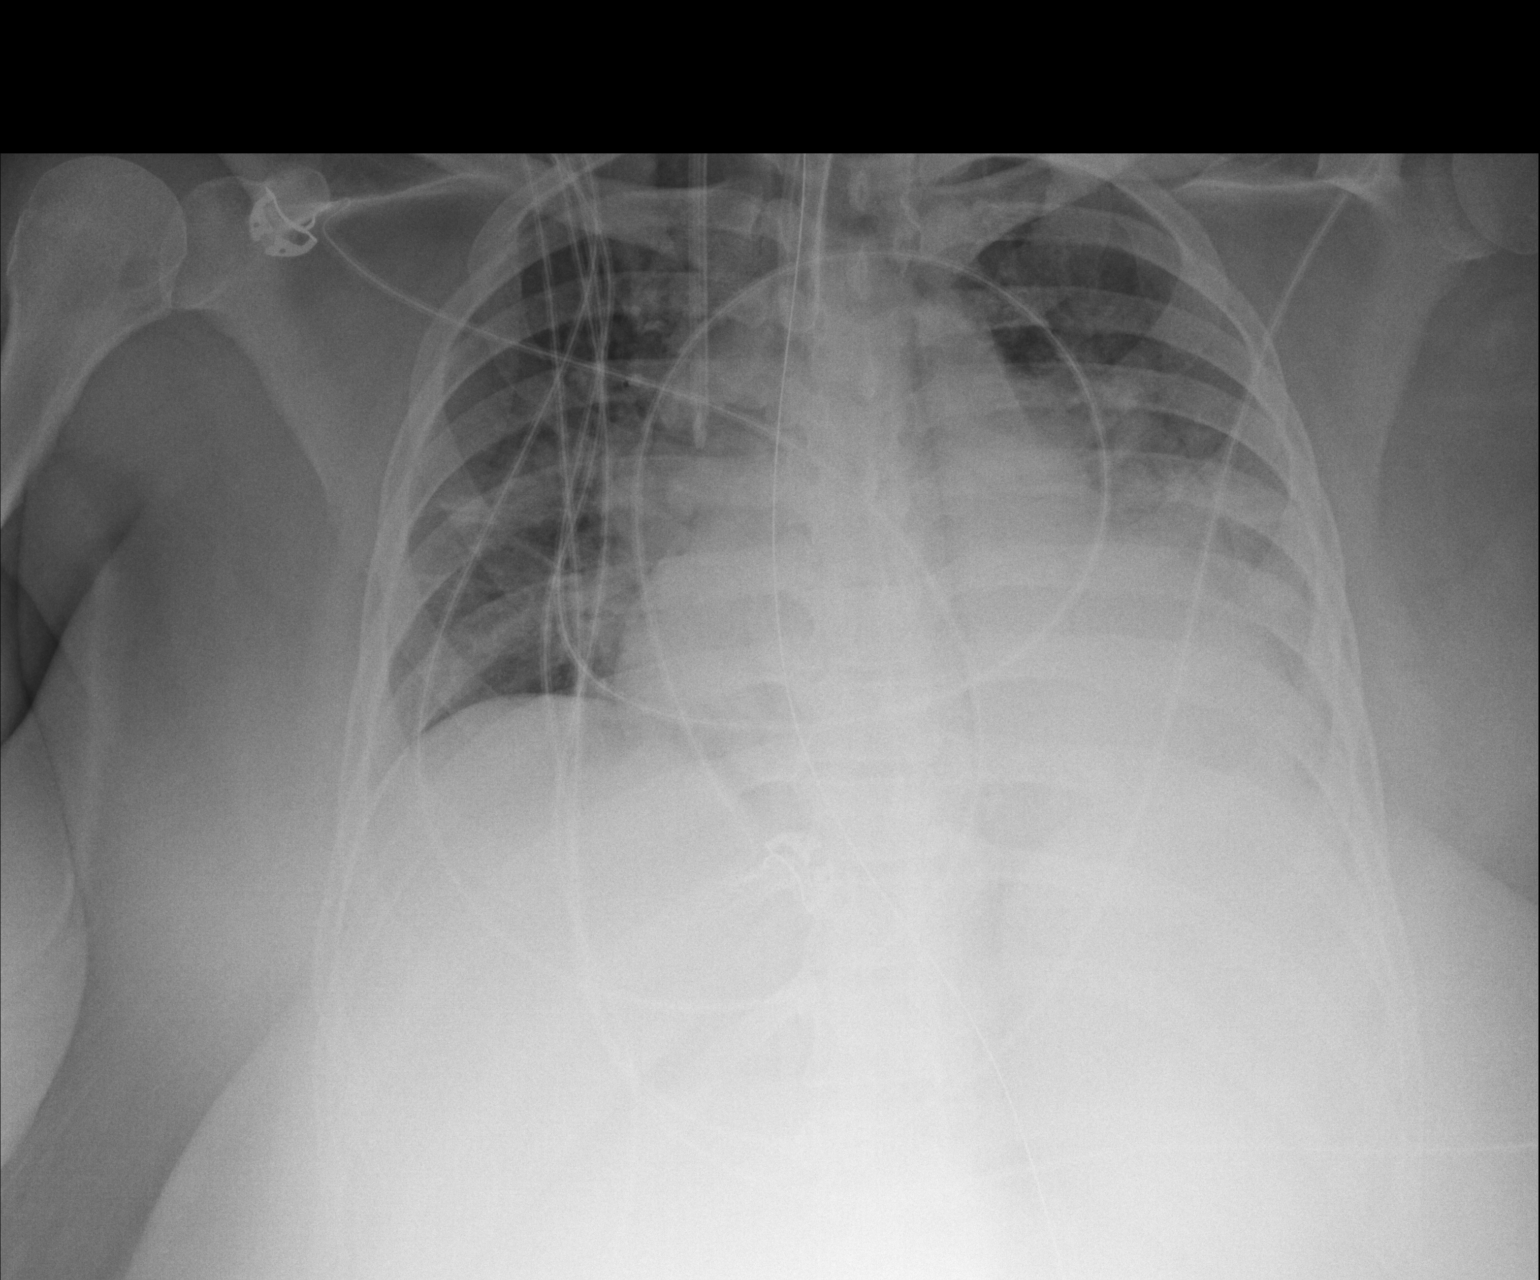

[1 of 1 positions shown; findings below may reference images not displayed]

FINDINGS: Endotracheal tube remains appropriately position, 2.8 cm
above carina.  Nasogastric tube extends beyond the  inferior aspect
of the film.  Large bore right IJ line is unchanged with tip at low
SVC.

Numerous leads and wires project over the chest.  Cardiomegaly
accentuated by AP portable technique.  Cannot exclude small
layering left pleural effusion. No pneumothorax.  Lung volumes
extremely low.  This accentuates the pulmonary interstitium.
Patchy right-sided atelectasis is similar.  There is worsened left
base aeration.  Similar mild interstitial edema.
IMPRESSION: 1.  Worsening left lower lobe aeration.  Cannot exclude developing
infection or aspiration.
2.  Low lung volumes with similar mild interstitial edema.

## 2013-04-13 IMAGING — CR DG CHEST 1V PORT
1 series · 1 of 1 positions shown · non-contrast
Comparison: Portable chest x-ray of 10/16/2011

CLINICAL DATA: Endotracheal tube placement

PORTABLE CHEST - 1 VIEW

[AP]
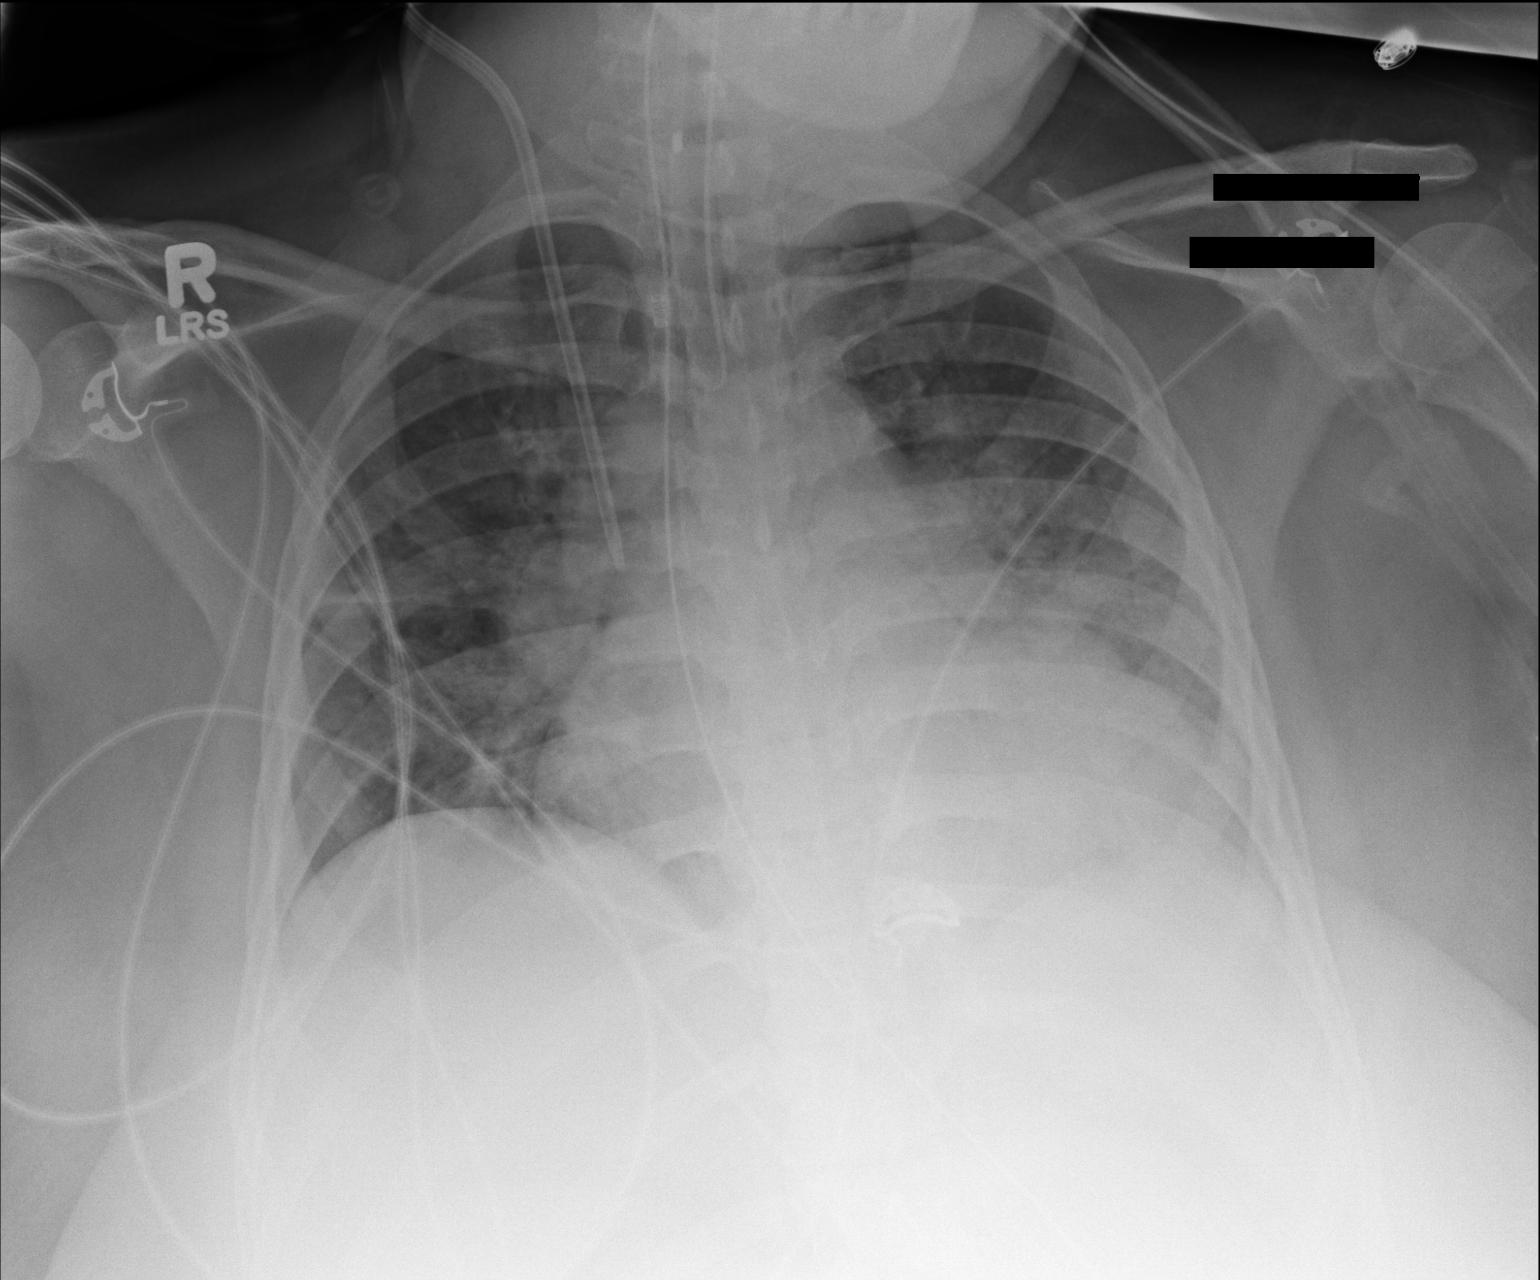

[1 of 1 positions shown; findings below may reference images not displayed]

FINDINGS: The tip of the endotracheal tube remains in good position
above the carina.  Right IJ central venous line tip overlies the
mid SVC.  The lungs are poorly aerated and there is cardiomegaly
present with probable mild edema.
IMPRESSION: No change in position endotracheal tube and right central venous
line.  Cardiomegaly and probable mild edema.

## 2013-04-21 ENCOUNTER — Emergency Department (HOSPITAL_COMMUNITY)
Admission: EM | Admit: 2013-04-21 | Discharge: 2013-04-21 | Disposition: A | Discharge status: Home / Self Care | Payer: Medicaid Other | Attending: Emergency Medicine | Admitting: Emergency Medicine

## 2013-04-21 ENCOUNTER — Encounter (HOSPITAL_COMMUNITY): Payer: Self-pay | Admitting: Emergency Medicine

## 2013-04-21 ENCOUNTER — Emergency Department (HOSPITAL_COMMUNITY): Payer: Medicaid Other

## 2013-04-21 DIAGNOSIS — Z79899 Other long term (current) drug therapy: Secondary | ICD-10-CM | POA: Insufficient documentation

## 2013-04-21 DIAGNOSIS — I10 Essential (primary) hypertension: Secondary | ICD-10-CM | POA: Insufficient documentation

## 2013-04-21 DIAGNOSIS — R0789 Other chest pain: Secondary | ICD-10-CM

## 2013-04-21 DIAGNOSIS — F319 Bipolar disorder, unspecified: Secondary | ICD-10-CM | POA: Insufficient documentation

## 2013-04-21 DIAGNOSIS — R071 Chest pain on breathing: Secondary | ICD-10-CM | POA: Insufficient documentation

## 2013-04-21 DIAGNOSIS — F209 Schizophrenia, unspecified: Secondary | ICD-10-CM | POA: Insufficient documentation

## 2013-04-21 DIAGNOSIS — E7800 Pure hypercholesterolemia, unspecified: Secondary | ICD-10-CM | POA: Insufficient documentation

## 2013-04-21 DIAGNOSIS — F17200 Nicotine dependence, unspecified, uncomplicated: Secondary | ICD-10-CM | POA: Insufficient documentation

## 2013-04-21 DIAGNOSIS — Z794 Long term (current) use of insulin: Secondary | ICD-10-CM | POA: Insufficient documentation

## 2013-04-21 DIAGNOSIS — J069 Acute upper respiratory infection, unspecified: Secondary | ICD-10-CM | POA: Insufficient documentation

## 2013-04-21 DIAGNOSIS — E119 Type 2 diabetes mellitus without complications: Secondary | ICD-10-CM | POA: Insufficient documentation

## 2013-04-21 DIAGNOSIS — Z87442 Personal history of urinary calculi: Secondary | ICD-10-CM | POA: Insufficient documentation

## 2013-04-21 LAB — CBC WITH DIFFERENTIAL/PLATELET
Basophils Absolute: 0 10*3/uL (ref 0.0–0.1)
Basophils Relative: 0 % (ref 0–1)
Eosinophils Absolute: 0.3 10*3/uL (ref 0.0–0.7)
Eosinophils Relative: 4 % (ref 0–5)
HCT: 38.1 % (ref 36.0–46.0)
Hemoglobin: 13 g/dL (ref 12.0–15.0)
LYMPHS ABS: 2.7 10*3/uL (ref 0.7–4.0)
Lymphocytes Relative: 34 % (ref 12–46)
MCH: 30.9 pg (ref 26.0–34.0)
MCHC: 34.1 g/dL (ref 30.0–36.0)
MCV: 90.5 fL (ref 78.0–100.0)
Monocytes Absolute: 0.5 10*3/uL (ref 0.1–1.0)
Monocytes Relative: 6 % (ref 3–12)
NEUTROS PCT: 55 % (ref 43–77)
Neutro Abs: 4.4 10*3/uL (ref 1.7–7.7)
PLATELETS: 331 10*3/uL (ref 150–400)
RBC: 4.21 MIL/uL (ref 3.87–5.11)
RDW: 13.4 % (ref 11.5–15.5)
WBC: 7.9 10*3/uL (ref 4.0–10.5)

## 2013-04-21 LAB — COMPREHENSIVE METABOLIC PANEL
ALK PHOS: 96 U/L (ref 39–117)
ALT: 20 U/L (ref 0–35)
AST: 14 U/L (ref 0–37)
Albumin: 3.7 g/dL (ref 3.5–5.2)
BUN: 6 mg/dL (ref 6–23)
CO2: 26 mEq/L (ref 19–32)
Calcium: 9.6 mg/dL (ref 8.4–10.5)
Chloride: 103 mEq/L (ref 96–112)
Creatinine, Ser: 0.61 mg/dL (ref 0.50–1.10)
GFR calc non Af Amer: >90 mL/min (ref >90)
GLUCOSE: 189 mg/dL — AB (ref 70–99)
POTASSIUM: 3.7 meq/L (ref 3.7–5.3)
SODIUM: 142 meq/L (ref 137–147)
Total Bilirubin: 0.2 mg/dL — ABNORMAL LOW (ref 0.3–1.2)
Total Protein: 7.6 g/dL (ref 6.0–8.3)

## 2013-04-21 LAB — TROPONIN I: Troponin I: <0.3 ng/mL (ref <0.30)

## 2013-04-21 LAB — GLUCOSE, CAPILLARY: Glucose-Capillary: 217 mg/dL — ABNORMAL HIGH (ref 70–99)

## 2013-04-21 NOTE — Discharge Instructions (Signed)
°Emergency Department Resource Guide °1) Find a Doctor and Pay Out of Pocket °Although you won't have to find out who is covered by your insurance plan, it is a good idea to ask around and get recommendations. You will then need to call the office and see if the doctor you have chosen will accept you as a new patient and what types of options they offer for patients who are self-pay. Some doctors offer discounts or will set up payment plans for their patients who do not have insurance, but you will need to ask so you aren't surprised when you get to your appointment. ° °2) Contact Your Local Health Department °Not all health departments have doctors that can see patients for sick visits, but many do, so it is worth a call to see if yours does. If you don't know where your local health department is, you can check in your phone book. The CDC also has a tool to help you locate your state's health department, and many state websites also have listings of all of their local health departments. ° °3) Find a Walk-in Clinic °If your illness is not likely to be very severe or complicated, you may want to try a walk in clinic. These are popping up all over the country in pharmacies, drugstores, and shopping centers. They're usually staffed by nurse practitioners or physician assistants that have been trained to treat common illnesses and complaints. They're usually fairly quick and inexpensive. However, if you have serious medical issues or chronic medical problems, these are probably not your best option. ° °No Primary Care Doctor: °- Call [facility] at  832-8000 - they can help you locate a primary care doctor that  accepts your insurance, provides certain services, etc. °- Physician Referral Service- 1-800-533-3463 ° °Chronic Pain Problems: °Organization         Address  Phone   Notes  °[facility]  (336) [phone] Patients need to be referred by their primary care doctor.  ° °Medication  Assistance: °Organization         Address  Phone   Notes  °[place] [facility] [address] °[place], [place] [address] (336) 641-8030 --Must be a resident of [place] °-- Must have NO insurance coverage whatsoever (no Medicaid/ Medicare, etc.) °-- The pt. MUST have a primary care doctor that directs their care regularly and follows them in the community °  °[facility]  (866) 331-1348   °[employer]  (888) 892-1162   ° °Agencies that provide inexpensive medical care: °Organization         Address  Phone   Notes  °[facility]  (336) 832-8035   °[facility]    (336) 832-7272   °[facility] [address] °[place], [place] [address] (336) 832-4777   °[facility] [address], °[place] (336) 271-4999   °[facility]    (336) 373-0678   °[facility]    (336) 272-1050   °[facility] ° [address], [place] Phone:  (336) 832-4444, Fax:  (336) 832-4440 Hours of Operation:  9 am - 6 pm, M-F.  Also accepts Medicaid/Medicare and self-pay.  °[facility] ° [address], [place] Phone: (336) [phone], Fax: (336) [phone]. Hours of Operation:  8:30 am - 5:30 pm, M-F.  Also accepts Medicaid and self-pay.  °[facility] [place] [address], [place] Phone: (336) 878-6027   °[facility] [address], [place], [place] (336)723-1848, Ext. 123 Mondays & Thursdays: 7-9 AM.  First 15 patients are seen on a first come, first serve basis. °  ° °Medicaid-accepting [place] Providers: ° °Organization         Address  Phone   Notes  °[facility] [address], [place] (336) 641-2100 Also accepts self-pay patients.  °[facility] [address], [place] ° (336) 856-9996   °[facility] [address], [place]  (336) 288-8857   °[facility] [address] [place] [address], [place] (336) 299-7000   °Veita Bland [address], [place]  ° (336) 373-1557 Only accepts [place] Access Medicaid patients after they have their name applied to their card.  ° °Self-Pay (no insurance) in [place]: ° °Organization         Address  Phone   Notes  °Sickle Cell Patients, [facility] [address], [place] (336) 832-1970   °[facility] [address], [place] (336) 832-4400   °[facility] ° [address], [place] (336) 992-4800   °[facility]/Dr. Osei-Bonsu ° [address] [place] [address], [place] or [address], [place] (336) [phone] Phone number for both [place] and [place] locations is the same.  °[facility] [address], [place] (336) 299-0000   °[facility] [address] [place] [address], [place] or [address] (336) 852-7530 °(336) 878-2260   °[facility] [address], [place] (336) 350-1642, phone; (336) 294-5005, fax Sees patients 1st and 3rd Saturday of every month.  Must not qualify for public or private insurance (i.e. Medicaid, Medicare, [place] Health Choice, Veterans' Benefits) • Household income should be no more than 200% of the poverty level •The clinic cannot treat you if you are pregnant or think you are pregnant • Sexually transmitted diseases are not treated at the clinic.  ° ° °Dental Care: °Organization         Address  Phone  Notes  °[place] [facility] [address], [place] (336) 641-6152 Accepts children up to age 21 who are enrolled in Medicaid or [place] Health Choice; pregnant women with a Medicaid card; and children who have applied for Medicaid or [place] Health Choice, but were declined, whose parents can pay a reduced fee at time of service.  °[place]  [facility] [place]  [address], [place] (336) 641-7733 Accepts children up to age 21 who are enrolled in Medicaid or [place] Health Choice; pregnant women with a Medicaid card; and children who have applied for Medicaid or [place] Health Choice, but were declined, whose parents can pay a reduced fee at time of service.  °[facility] ° [address], [place] (336) 641-4533 Patients are seen by appointment only. Walk-ins are not accepted. [facility] will see patients 18 years of age and older. °Monday - Tuesday (8am-5pm) °Most Wednesdays (8:30-5pm) °$30 per visit, cash only  °[facility] ° [address], [place] (336) 641-4533 Patients are seen by appointment only. Walk-ins are not accepted. [facility] will see patients 18 years of age and older. °One   Wednesday Evening (Monthly: Volunteer Based).  $30 per visit, cash only  °[facility]  (919) 537-3737 for adults; Children under age 4, call Graduate Pediatric Dentistry at ([phone]) [phone]. Children aged 4-14, please call ([phone]) [phone] to request a pediatric application. ° Dental services are provided in all areas of dental care including fillings, crowns and bridges, complete and partial dentures, implants, gum treatment, root canals, and extractions. Preventive care is also provided. Treatment is provided to both adults and children. °Patients are selected via a lottery and there is often a waiting list. °  °[facility] [address], °[place] ° (336) 763-8833 [email or web address] °  °[facility] [address], [place], [place] (336)723-1848, Ext. 123 Second and Fourth Thursday of each month, opens at 6:30 AM; Clinic ends at 9 AM.  Patients are seen on a first-come first-served basis, and a limited number are seen during each clinic.  ° °[facility] ° [address], [place], [place] (336) 723-7904    Eligibility Requirements °You must have lived in [place], [place], or [place] counties for at least the last three months. °  You cannot be eligible for state or federal sponsored healthcare insurance, including Veterans Administration, Medicaid, or Medicare. °  You generally cannot be eligible for healthcare insurance through your employer.  °  How to apply: °Eligibility screenings are held every Tuesday and Wednesday afternoon from 1:00 pm until 4:00 pm. You do not need an appointment for the interview!  °[facility] [address], [place], [place] 336-631-2330   °[facility]  336-342-8273   °[facility]  336-703-3100   °[facility]  336-570-6415   ° °Behavioral Health Resources in the Community: °Intensive Outpatient Programs °Organization         Address  Phone  Notes  °[facility] [address], [place], [place] 336-878-6098   °[facility] [address], [place], [place] 336-832-9800   °[facility] [address], [place], [place] ° 336-882-2125   °[facility] [address],  °[place], [place] 1-800-853-5163 or 336-641-4981   °Substance Abuse Resources °Organization         Address  Phone  Notes  °[facility]  336-882-2125   °[facility]  336-784-9470   °[facility]  336-285-9073   °[facility]  336-845-3988   °Residential & Outpatient Substance Abuse Program  1-800-659-3381   °Psychological Services °Organization         Address  Phone  Notes  °[facility]  336- 832-9600   °[facility]  336- 378-7881   °[facility] [address], [place] 1-800-853-5163 or 336-641-4981   ° °Mobile Crisis Teams °Organization         Address  Phone  Notes  °[facility], Mobile Crisis Care Unit  1-877-626-1772   °Assertive °[facility] ° [address]  [place], [place] 336-834-9664   °Sharon DeEsch [address] °[place] 336-554-5454   ° °Self-Help/Support Groups °Organization         Address  Phone             Notes  °[facility]  - variety of support groups  336- 373-1402 Call for more information  °Narcotics Anonymous (NA), [facility] [address], °[place]  2 meetings at this location  ° °  Residential Treatment Programs Organization         Address  Phone  Notes  [facility] [address],    [place]  [phone]   [facility]  [address], [place] [phone], [place], [place] [phone]   [facility] [address], [place] [phone] Admissions: 8am-3pm M-F  [facility] [address]    [place], [place] [phone]([phone]) [phone]   [facility] [address], [place], [place] [phone]   [facility] [address]  [place], [place] [phone]   [facility] Intensive Outpatient [address], [place], [place] [phone]   [facility] [address]  [place], [place] [phone] or [phone])[phone]   [facility] ([facility] [address] [place], [place] [phone] Accepts Medicaid  [facility] [address]  [place] [phone] Substance Abuse/Addiction Treatment   [facility] Organization         Address  Phone  Notes  [facility]  [phone](888) [phone]   Angie FavaJulie Brannon, PhD [address], [address] [place], [place]   [phone]) [phone] or ([phone](336) [phone]   [facility]   [address] [place], [place] [phone](336) [phone]   [facility] [address], [place], [place] [phone](336) ([phone] Insurance/Medicaid/sponsorship through [facility] [address]                                    [place], [place] [phone](336) [phone] Therapy/tele-psych/case    [facility] [address]   [place], [place] [phone](336) [phone]    Dr. Lolly MustacheArfeen  [phone](336) [phone]   [facility]  [employer] [facility] 1) [address], [place] 2) [address], [place] 3)  [address], [place] [phone](336) [phone](336) [phone]  [phone](336) [phone]   [facility] Child [facility] Hot[facility] (321)570-2807(336) (306)622-4761 or [phone](336) [phone] (After Hours)       Apply moist heat or ice to the area(s) of discomfort, for 15 minutes at a time, several times per day for the next few days.  Do not fall asleep on a heating or ice pack. Take over the counter decongestant (such as sudafed), as directed on packaging, for the next week.  Use over the counter normal saline nasal spray, as instructed in the Emergency Department, several times per day for the next 2 weeks. Take over the counter tylenol and ibuprofen, as directed on packaging, as needed for discomfort.  Call your regular medical doctor today to schedule a follow up appointment this week.  Return to the Emergency Department immediately if worsening.

## 2013-04-21 NOTE — ED Notes (Signed)
Discharge instructions reviewed with pt, questions answered. Pt verbalized understanding.  

## 2013-04-21 NOTE — ED Notes (Signed)
Patient c/o mid-sternal, non-radiating chest pain with slight shortness of breath that started yesterday. Denies any nausea, vomiting, fevers, or cough. Patient does report headache and nasal congestion.

## 2013-04-21 NOTE — ED Provider Notes (Signed)
[name]: [id number]     Arrival date & time 04/21/13  1133 History   First MD Initiated Contact with Patient 04/21/13 1352     Chief Complaint  Patient presents with  . Chest Pain     HPI Pt was seen at 1355.  Per pt, c/o gradual onset and persistence of constant runny/stuffy nose, frontal headache, sinus congestion, and cough for the past 2 days. Has been associated with constant upper chest "pain" for the past 2 days. Pain is constant, worsens with palpation of the area and body position changes.   Denies fevers, no rash, no CP/SOB, no N/V/D, no abd pain.    Past Medical History  Diagnosis Date  . Schizophrenia   . Bipolar disorder   . Diabetes mellitus   . Hypercholesteremia   . Hypertension   . Kidney stone    Past Surgical History  Procedure Laterality Date  . Cholecystectomy    . Tubal ligation     Family History  Problem Relation Age of Onset  . Cancer Father    History  Substance Use Topics  . Smoking status: Current Every Day Smoker -- 1.00 packs/day for 15 years    Types: Cigarettes  . Smokeless tobacco: Never Used  . Alcohol Use: No   OB History   Grav Para Term Preterm Abortions TAB SAB Ect Mult Living   3 3 3       3      Review of Systems ROS: Statement: All systems negative except as marked or noted in the HPI; Constitutional: Negative for fever and chills. ; ; Eyes: Negative for eye pain, redness and discharge. ; ; ENMT: Negative for ear pain, hoarseness, sore throat. +nasal congestion, sinus pressure. ; ; Cardiovascular: +CP. Negative for palpitations, diaphoresis, dyspnea and peripheral edema. ; ; Respiratory: +cough. Negative for wheezing and stridor. ; ; Gastrointestinal: Negative for nausea, vomiting, diarrhea, abdominal pain, blood in stool, hematemesis, jaundice and rectal bleeding. . ; ; Genitourinary: Negative for dysuria, flank pain and hematuria. ; ; Musculoskeletal: Negative for back pain and neck pain. Negative for swelling and trauma.; ; Skin:  Negative for pruritus, rash, abrasions, blisters, bruising and skin lesion.; ; Neuro: +frontal headache. Negative for lightheadedness and neck stiffness. Negative for weakness, altered level of consciousness , altered mental status, extremity weakness, paresthesias, involuntary movement, seizure and syncope.      Allergies  Etomidate and Haldol  Home Medications   Current Outpatient Rx  Name  Route  Sig  Dispense  Refill  . albuterol (PROVENTIL HFA;VENTOLIN HFA) 108 (90 BASE) MCG/ACT inhaler   Inhalation   Inhale 2 puffs into the lungs 4 (four) times daily.         . Aspirin-Salicylamide-Caffeine (BC HEADACHE) 325-95-16 MG TABS   Oral   Take 1 packet by mouth daily as needed (for headache pain).         . benztropine (COGENTIN) 2 MG tablet   Oral   Take 2 mg by mouth 2 (two) times daily.         . busPIRone (BUSPAR) 10 MG tablet   Oral   Take 15 mg by mouth 2 (two) times daily.         Marland Kitchen gabapentin (NEURONTIN) 300 MG capsule   Oral   Take 300 mg by mouth at bedtime.         . insulin aspart protamine-insulin aspart (NOVOLOG 70/30) (70-30) 100 UNIT/ML injection   Subcutaneous   Inject 40-60 Units into the skin  2 (two) times daily. 60 units in the AM and 40 units at night         . lisinopril (PRINIVIL,ZESTRIL) 40 MG tablet   Oral   Take 40 mg by mouth daily.         Marland Kitchen lithium 300 MG tablet   Oral   Take 300 mg by mouth 2 (two) times daily.         . metFORMIN (GLUCOPHAGE-XR) 500 MG 24 hr tablet   Oral   Take 500 mg by mouth 2 (two) times daily.          . metoprolol tartrate (LOPRESSOR) 25 MG tablet   Oral   Take 12.5 mg by mouth 2 (two) times daily.         Marland Kitchen OLANZapine (ZYPREXA) 10 MG tablet   Oral   Take 10 mg by mouth at bedtime.         . Paliperidone Palmitate (INVEGA SUSTENNA) 234 MG/1.5ML SUSP   Intramuscular   Inject 234 mg into the muscle every 28 (twenty-eight) days.          . simvastatin (ZOCOR) 20 MG tablet   Oral    Take 20 mg by mouth at bedtime.         . traZODone (DESYREL) 100 MG tablet   Oral   Take 100 mg by mouth at bedtime.          BP 151/89  Pulse 72  Temp(Src) 98.9 F (37.2 C) (Oral)  Resp 20  Ht 5\' 3"  (1.6 m)  Wt 232 lb (105.235 kg)  BMI 41.11 kg/m2  SpO2 97% Physical Exam 1400: Physical examination:  Nursing notes reviewed; Vital signs and O2 SAT reviewed;  Constitutional: Well developed, Well nourished, Well hydrated, In no acute distress; Head:  Normocephalic, atraumatic; Eyes: EOMI, PERRL, No scleral icterus; ENMT: TM's clear bilat. +edemetous nasal turbinates bilat with clear rhinorrhea. Mouth and pharynx normal, Mucous membranes moist; Neck: Supple, Full range of motion, No lymphadenopathy; Cardiovascular: Regular rate and rhythm, No murmur, rub, or gallop; Respiratory: Breath sounds clear & equal bilaterally, No rales, rhonchi, wheezes.  Speaking full sentences with ease, Normal respiratory effort/excursion; Chest: +upper bilateral chest wall areas tender to palp. No soft tissue crepitus, no rash, no deformity. Movement normal; Abdomen: Soft, Nontender, Nondistended, Normal bowel sounds; Genitourinary: No CVA tenderness; Extremities: Pulses normal, No tenderness, No edema, No calf edema or asymmetry.; Neuro: AA&Ox3, Major CN grossly intact.  Speech clear. No gross focal motor or sensory deficits in extremities. Climbs on and off stretcher easily by herself. Gait steady.; Skin: Color normal, Warm, Dry.   ED Course  Procedures  EKG Interpretation    Date/Time:  Monday April 21 2013 11:55:28 EST Ventricular Rate:  69 PR Interval:  178 QRS Duration: 96 QT Interval:  462 QTC Calculation: 495 R Axis:   83 Text Interpretation:  Normal sinus rhythm Artifact Baseline wander Nonspecific T wave abnormality Prolonged QT Abnormal ECG When compared with ECG of 12-Aug-2012 01:12, No significant change was found Confirmed by [facility]  MD, Nicholos Johns [phone]) on 04/21/2013 2:04:49  PM            MDM  MDM Reviewed: previous chart, nursing note and vitals Reviewed previous: labs and ECG Interpretation: labs, ECG and x-ray    Results for orders placed during the hospital encounter of 04/21/13  CBC WITH DIFFERENTIAL      Result Value Ref Range   WBC 7.9  4.0 - 10.5 K/uL  RBC 4.21  3.87 - 5.11 MIL/uL   Hemoglobin 13.0  12.0 - 15.0 g/dL   HCT 16.138.1  09.636.0 - 04.546.0 %   MCV 90.5  78.0 - 100.0 fL   MCH 30.9  26.0 - 34.0 pg   MCHC 34.1  30.0 - 36.0 g/dL   RDW 40.913.4  81.111.5 - 91.415.5 %   Platelets 331  150 - 400 K/uL   Neutrophils Relative % 55  43 - 77 %   Neutro Abs 4.4  1.7 - 7.7 K/uL   Lymphocytes Relative 34  12 - 46 %   Lymphs Abs 2.7  0.7 - 4.0 K/uL   Monocytes Relative 6  3 - 12 %   Monocytes Absolute 0.5  0.1 - 1.0 K/uL   Eosinophils Relative 4  0 - 5 %   Eosinophils Absolute 0.3  0.0 - 0.7 K/uL   Basophils Relative 0  0 - 1 %   Basophils Absolute 0.0  0.0 - 0.1 K/uL  COMPREHENSIVE METABOLIC PANEL      Result Value Ref Range   Sodium 142  137 - 147 mEq/L   Potassium 3.7  3.7 - 5.3 mEq/L   Chloride 103  96 - 112 mEq/L   CO2 26  19 - 32 mEq/L   Glucose, Bld 189 (*) 70 - 99 mg/dL   BUN 6  6 - 23 mg/dL   Creatinine, Ser 7.820.61  0.50 - 1.10 mg/dL   Calcium 9.6  8.4 - 95.610.5 mg/dL   Total Protein 7.6  6.0 - 8.3 g/dL   Albumin 3.7  3.5 - 5.2 g/dL   AST 14  0 - 37 U/L   ALT 20  0 - 35 U/L   Alkaline Phosphatase 96  39 - 117 U/L   Total Bilirubin 0.2 (*) 0.3 - 1.2 mg/dL   GFR calc non Af Amer >90  >90 mL/min   GFR calc Af Amer >90  >90 mL/min  TROPONIN I      Result Value Ref Range   Troponin I <0.30  <0.30 ng/mL  GLUCOSE, CAPILLARY      Result Value Ref Range   Glucose-Capillary 217 (*) 70 - 99 mg/dL   Dg Chest 2 View 2/13/08652/16/2015   CLINICAL DATA:  Chest pain and shortness of breath.  EXAM: CHEST  2 VIEW  COMPARISON:  08/14/2012  FINDINGS: There is new borderline cardiomegaly. There is slight linear atelectasis in both mid zones. No consolidative  infiltrates or effusions. No osseous abnormality. Pulmonary vascularity is normal.  IMPRESSION: New minimal atelectasis in both mid zones. New borderline cardiomegaly.   Electronically Signed   By: Geanie CooleyJim  Maxwell M.D.   On: 04/21/2013 12:24    1415:  Doubt PE as cause for symptoms with low risk [place].  Doubt ACS as cause for symptoms with normal troponin and unchanged EKG from previous after 2 days of constant atypical symptoms. Tx symptomatically for msk pain and URI. Dx and testing d/w pt.  Questions answered.  Verb understanding, agreeable to d/c home with outpt f/u.     Laray AngerKathleen M Edrian Melucci, DO 04/23/13 [phone]

## 2013-08-27 ENCOUNTER — Encounter (HOSPITAL_COMMUNITY): Payer: Self-pay | Admitting: Emergency Medicine

## 2013-08-27 ENCOUNTER — Emergency Department (HOSPITAL_COMMUNITY)
Admission: EM | Admit: 2013-08-27 | Discharge: 2013-08-27 | Disposition: A | Discharge status: Home / Self Care | Payer: Medicaid Other | Attending: Emergency Medicine | Admitting: Emergency Medicine

## 2013-08-27 DIAGNOSIS — E119 Type 2 diabetes mellitus without complications: Secondary | ICD-10-CM | POA: Insufficient documentation

## 2013-08-27 DIAGNOSIS — R51 Headache: Secondary | ICD-10-CM | POA: Insufficient documentation

## 2013-08-27 DIAGNOSIS — M25562 Pain in left knee: Secondary | ICD-10-CM

## 2013-08-27 DIAGNOSIS — F209 Schizophrenia, unspecified: Secondary | ICD-10-CM | POA: Insufficient documentation

## 2013-08-27 DIAGNOSIS — F319 Bipolar disorder, unspecified: Secondary | ICD-10-CM | POA: Insufficient documentation

## 2013-08-27 DIAGNOSIS — M25569 Pain in unspecified knee: Secondary | ICD-10-CM | POA: Insufficient documentation

## 2013-08-27 DIAGNOSIS — I10 Essential (primary) hypertension: Secondary | ICD-10-CM | POA: Insufficient documentation

## 2013-08-27 DIAGNOSIS — R519 Headache, unspecified: Secondary | ICD-10-CM

## 2013-08-27 DIAGNOSIS — M25561 Pain in right knee: Secondary | ICD-10-CM

## 2013-08-27 DIAGNOSIS — G8921 Chronic pain due to trauma: Secondary | ICD-10-CM | POA: Insufficient documentation

## 2013-08-27 DIAGNOSIS — Z794 Long term (current) use of insulin: Secondary | ICD-10-CM | POA: Insufficient documentation

## 2013-08-27 DIAGNOSIS — Z79899 Other long term (current) drug therapy: Secondary | ICD-10-CM | POA: Insufficient documentation

## 2013-08-27 DIAGNOSIS — E785 Hyperlipidemia, unspecified: Secondary | ICD-10-CM | POA: Insufficient documentation

## 2013-08-27 DIAGNOSIS — F17200 Nicotine dependence, unspecified, uncomplicated: Secondary | ICD-10-CM | POA: Insufficient documentation

## 2013-08-27 DIAGNOSIS — Z87442 Personal history of urinary calculi: Secondary | ICD-10-CM | POA: Insufficient documentation

## 2013-08-27 DIAGNOSIS — R110 Nausea: Secondary | ICD-10-CM | POA: Insufficient documentation

## 2013-08-27 MED ORDER — IBUPROFEN 800 MG PO TABS
800.0000 mg | ORAL_TABLET | Freq: Once | ORAL | Status: DC
  Filled 2013-08-27: qty 1

## 2013-08-27 MED ORDER — KETOROLAC TROMETHAMINE 60 MG/2ML IM SOLN
60.0000 mg | Freq: Once | INTRAMUSCULAR | Status: DC
  Filled 2013-08-27: qty 2

## 2013-08-27 MED ORDER — ONDANSETRON 8 MG PO TBDP
8.0000 mg | ORAL_TABLET | Freq: Once | ORAL | Status: DC
  Filled 2013-08-27: qty 1

## 2013-08-27 NOTE — Discharge Instructions (Signed)
Take ibuprofen 600 mg every 6 hours as needed for pain.  Followup with your primary Dr. if not improving in the next week.   Nausea, Adult Nausea is the feeling that you have an upset stomach or have to vomit. Nausea by itself is not likely a serious concern, but it may be an early sign of more serious medical problems. As nausea gets worse, it can lead to vomiting. If vomiting develops, there is the risk of dehydration.  CAUSES   Viral infections.  Food poisoning.  Medicines.  Pregnancy.  Motion sickness.  Migraine headaches.  Emotional distress.  Severe pain from any source.  Alcohol intoxication. HOME CARE INSTRUCTIONS  Get plenty of rest.  Ask your caregiver about specific rehydration instructions.  Eat small amounts of food and sip liquids more often.  Take all medicines as told by your caregiver. SEEK MEDICAL CARE IF:  You have not improved after 2 days, or you get worse.  You have a headache. SEEK IMMEDIATE MEDICAL CARE IF:   You have a fever.  You faint.  You keep vomiting or have blood in your vomit.  You are extremely weak or dehydrated.  You have dark or bloody stools.  You have severe chest or abdominal pain. MAKE SURE YOU:  Understand these instructions.  Will watch your condition.  Will get help right away if you are not doing well or get worse. Document Released: 03/30/2004 Document Revised: 11/15/2011 Document Reviewed: 11/02/2010 [facility] Patient Information 2015 [place], [place]. This information is not intended to replace advice given to you by your health care provider. Make sure you discuss any questions you have with your health care provider.    Knee Pain The knee is the complex joint between your thigh and your lower leg. It is made up of bones, tendons, ligaments, and cartilage. The bones that make up the knee are:  The femur in the thigh.  The tibia and fibula in the lower leg.  The patella or kneecap riding in the  groove on the lower femur. CAUSES  Knee pain is a common complaint with many causes. A few of these causes are:  Injury, such as:  A ruptured ligament or tendon injury.  Torn cartilage.  Medical conditions, such as:  Gout  Arthritis  Infections  Overuse, over training, or overdoing a physical activity. Knee pain can be minor or severe. Knee pain can accompany debilitating injury. Minor knee problems often respond well to self-care measures or get well on their own. More serious injuries may need medical intervention or even surgery. SYMPTOMS The knee is complex. Symptoms of knee problems can vary widely. Some of the problems are:  Pain with movement and weight bearing.  Swelling and tenderness.  Buckling of the knee.  Inability to straighten or extend your knee.  Your knee locks and you cannot straighten it.  Warmth and redness with pain and fever.  Deformity or dislocation of the kneecap. DIAGNOSIS  Determining what is wrong may be very straight forward such as when there is an injury. It can also be challenging because of the complexity of the knee. Tests to make a diagnosis may include:  Your caregiver taking a history and doing a physical exam.  Routine X-rays can be used to rule out other problems. X-rays will not reveal a cartilage tear. Some injuries of the knee can be diagnosed by:  Arthroscopy a surgical technique by which a small video camera is inserted through tiny incisions on the sides of  the knee. This procedure is used to examine and repair internal knee joint problems. Tiny instruments can be used during arthroscopy to repair the torn knee cartilage (meniscus).  Arthrography is a radiology technique. A contrast liquid is directly injected into the knee joint. Internal structures of the knee joint then become visible on X-ray film.  An MRI scan is a non X-ray radiology procedure in which magnetic fields and a computer produce two- or three-dimensional  images of the inside of the knee. Cartilage tears are often visible using an MRI scanner. MRI scans have largely replaced arthrography in diagnosing cartilage tears of the knee.  Blood work.  Examination of the fluid that helps to lubricate the knee joint (synovial fluid). This is done by taking a sample out using a needle and a syringe. TREATMENT The treatment of knee problems depends on the cause. Some of these treatments are:  Depending on the injury, proper casting, splinting, surgery, or physical therapy care will be needed.  Give yourself adequate recovery time. Do not overuse your joints. If you begin to get sore during workout routines, back off. Slow down or do fewer repetitions.  For repetitive activities such as cycling or running, maintain your strength and nutrition.  Alternate muscle groups. For example, if you are a weight lifter, work the upper body on one day and the lower body the next.  Either tight or weak muscles do not give the proper support for your knee. Tight or weak muscles do not absorb the stress placed on the knee joint. Keep the muscles surrounding the knee strong.  Take care of mechanical problems.  If you have flat feet, orthotics or special shoes may help. See your caregiver if you need help.  Arch supports, sometimes with wedges on the inner or outer aspect of the heel, can help. These can shift pressure away from the side of the knee most bothered by osteoarthritis.  A brace called an "unloader" brace also may be used to help ease the pressure on the most arthritic side of the knee.  If your caregiver has prescribed crutches, braces, wraps or ice, use as directed. The acronym for this is PRICE. This means protection, rest, ice, compression, and elevation.  Nonsteroidal anti-inflammatory drugs (NSAIDs), can help relieve pain. But if taken immediately after an injury, they may actually increase swelling. Take NSAIDs with food in your stomach. Stop them if  you develop stomach problems. Do not take these if you have a history of ulcers, stomach pain, or bleeding from the bowel. Do not take without your caregiver's approval if you have problems with fluid retention, heart failure, or kidney problems.  For ongoing knee problems, physical therapy may be helpful.  Glucosamine and chondroitin are over-the-counter dietary supplements. Both may help relieve the pain of osteoarthritis in the knee. These medicines are different from the usual anti-inflammatory drugs. Glucosamine may decrease the rate of cartilage destruction.  Injections of a corticosteroid drug into your knee joint may help reduce the symptoms of an arthritis flare-up. They may provide pain relief that lasts a few months. You may have to wait a few months between injections. The injections do have a small increased risk of infection, water retention, and elevated blood sugar levels.  Hyaluronic acid injected into damaged joints may ease pain and provide lubrication. These injections may work by reducing inflammation. A series of shots may give relief for as long as 6 months.  Topical painkillers. Applying certain ointments to your skin may help  relieve the pain and stiffness of osteoarthritis. Ask your pharmacist for suggestions. Many over the-counter products are approved for temporary relief of arthritis pain.  In some countries, doctors often prescribe topical NSAIDs for relief of chronic conditions such as arthritis and tendinitis. A review of treatment with NSAID creams found that they worked as well as oral medications but without the serious side effects. PREVENTION  Maintain a healthy weight. Extra pounds put more strain on your joints.  Get strong, stay limber. Weak muscles are a common cause of knee injuries. Stretching is important. Include flexibility exercises in your workouts.  Be smart about exercise. If you have osteoarthritis, chronic knee pain or recurring injuries, you may  need to change the way you exercise. This does not mean you have to stop being active. If your knees ache after jogging or playing basketball, consider switching to swimming, water aerobics, or other low-impact activities, at least for a few days a week. Sometimes limiting high-impact activities will provide relief.  Make sure your shoes fit well. Choose footwear that is right for your sport.  Protect your knees. Use the proper gear for knee-sensitive activities. Use kneepads when playing volleyball or laying carpet. Buckle your seat belt every time you drive. Most shattered kneecaps occur in car accidents.  Rest when you are tired. SEEK MEDICAL CARE IF:  You have knee pain that is continual and does not seem to be getting better.  SEEK IMMEDIATE MEDICAL CARE IF:  Your knee joint feels hot to the touch and you have a high fever. MAKE SURE YOU:   Understand these instructions.  Will watch your condition.  Will get help right away if you are not doing well or get worse. Document Released: 12/18/2006 Document Revised: 05/15/2011 Document Reviewed: 12/18/2006 [facility] Patient Information 2015 [place], [place]. This information is not intended to replace advice given to you by your health care provider. Make sure you discuss any questions you have with your health care provider.

## 2013-08-27 NOTE — ED Notes (Signed)
Pt states that she wants to wait in the waiting room for her ride.

## 2013-08-27 NOTE — ED Notes (Signed)
Pt c/o headache that started today and also c/o bilateral knee pain and abd pain.
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
## 2013-09-03 DEATH — deceased

## 2014-01-05 ENCOUNTER — Encounter (HOSPITAL_COMMUNITY): Payer: Self-pay | Admitting: Emergency Medicine
